# Patient Record
Sex: Male | Born: 1965 | Race: White | Hispanic: No | Marital: Married | Smoking: Never smoker
Health system: Southern US, Community
[De-identification: ages and names within clinical notes are randomized; demographics above are authoritative.]

## PROBLEM LIST (undated history)

## (undated) DIAGNOSIS — G2581 Restless legs syndrome: Secondary | ICD-10-CM

## (undated) DIAGNOSIS — F329 Major depressive disorder, single episode, unspecified: Secondary | ICD-10-CM

## (undated) DIAGNOSIS — F419 Anxiety disorder, unspecified: Secondary | ICD-10-CM

## (undated) DIAGNOSIS — G4733 Obstructive sleep apnea (adult) (pediatric): Secondary | ICD-10-CM

## (undated) DIAGNOSIS — G4730 Sleep apnea, unspecified: Secondary | ICD-10-CM

## (undated) DIAGNOSIS — E119 Type 2 diabetes mellitus without complications: Secondary | ICD-10-CM

## (undated) DIAGNOSIS — I10 Essential (primary) hypertension: Secondary | ICD-10-CM

## (undated) DIAGNOSIS — E0590 Thyrotoxicosis, unspecified without thyrotoxic crisis or storm: Secondary | ICD-10-CM

## (undated) DIAGNOSIS — E785 Hyperlipidemia, unspecified: Secondary | ICD-10-CM

## (undated) DIAGNOSIS — Z9989 Dependence on other enabling machines and devices: Secondary | ICD-10-CM

## (undated) DIAGNOSIS — F32A Depression, unspecified: Secondary | ICD-10-CM

## (undated) HISTORY — DX: Essential (primary) hypertension: I10

## (undated) HISTORY — PX: HERNIA REPAIR: SHX51

## (undated) HISTORY — DX: Obstructive sleep apnea (adult) (pediatric): G47.33

## (undated) HISTORY — DX: Dependence on other enabling machines and devices: Z99.89

## (undated) HISTORY — DX: Hyperlipidemia, unspecified: E78.5

## (undated) HISTORY — DX: Sleep apnea, unspecified: G47.30

## (undated) HISTORY — DX: Type 2 diabetes mellitus without complications: E11.9

## (undated) HISTORY — DX: Restless legs syndrome: G25.81

## (undated) HISTORY — DX: Thyrotoxicosis, unspecified without thyrotoxic crisis or storm: E05.90

## (undated) HISTORY — DX: Major depressive disorder, single episode, unspecified: F32.9

## (undated) HISTORY — DX: Depression, unspecified: F32.A

## (undated) HISTORY — DX: Anxiety disorder, unspecified: F41.9

---

## 1990-06-03 HISTORY — PX: WISDOM TOOTH EXTRACTION: SHX21

## 2001-06-18 ENCOUNTER — Encounter: Payer: Self-pay | Admitting: Emergency Medicine

## 2001-06-18 ENCOUNTER — Emergency Department (HOSPITAL_COMMUNITY): Admission: EM | Admit: 2001-06-18 | Discharge: 2001-06-18 | Payer: Self-pay | Admitting: Emergency Medicine

## 2003-08-29 ENCOUNTER — Encounter: Admission: RE | Admit: 2003-08-29 | Discharge: 2003-11-27 | Payer: Self-pay | Admitting: Family Medicine

## 2003-12-08 ENCOUNTER — Encounter: Admission: RE | Admit: 2003-12-08 | Discharge: 2003-12-08 | Payer: Self-pay | Admitting: Family Medicine

## 2005-06-03 HISTORY — PX: VASECTOMY: SHX75

## 2006-05-06 ENCOUNTER — Ambulatory Visit: Payer: Self-pay | Admitting: Pulmonary Disease

## 2006-09-23 ENCOUNTER — Encounter: Admission: RE | Admit: 2006-09-23 | Discharge: 2006-09-23 | Payer: Self-pay | Admitting: Family Medicine

## 2007-08-10 ENCOUNTER — Ambulatory Visit (HOSPITAL_COMMUNITY): Admission: RE | Admit: 2007-08-10 | Discharge: 2007-08-10 | Payer: Self-pay | Admitting: Cardiology

## 2009-02-13 ENCOUNTER — Ambulatory Visit (HOSPITAL_COMMUNITY): Admission: RE | Admit: 2009-02-13 | Discharge: 2009-02-13 | Payer: Self-pay | Admitting: Family Medicine

## 2009-06-26 ENCOUNTER — Encounter: Admission: RE | Admit: 2009-06-26 | Discharge: 2009-06-26 | Payer: Self-pay | Admitting: Family Medicine

## 2009-09-06 ENCOUNTER — Encounter: Admission: RE | Admit: 2009-09-06 | Discharge: 2009-10-05 | Payer: Self-pay | Admitting: Endocrinology

## 2009-09-07 ENCOUNTER — Ambulatory Visit: Payer: Self-pay | Admitting: Internal Medicine

## 2009-09-07 DIAGNOSIS — E1165 Type 2 diabetes mellitus with hyperglycemia: Secondary | ICD-10-CM

## 2009-09-07 DIAGNOSIS — Z6841 Body Mass Index (BMI) 40.0 and over, adult: Secondary | ICD-10-CM

## 2009-09-07 DIAGNOSIS — I10 Essential (primary) hypertension: Secondary | ICD-10-CM

## 2009-09-07 DIAGNOSIS — Z87442 Personal history of urinary calculi: Secondary | ICD-10-CM | POA: Insufficient documentation

## 2009-09-07 DIAGNOSIS — J45909 Unspecified asthma, uncomplicated: Secondary | ICD-10-CM

## 2009-09-07 DIAGNOSIS — E0590 Thyrotoxicosis, unspecified without thyrotoxic crisis or storm: Secondary | ICD-10-CM | POA: Insufficient documentation

## 2009-09-07 DIAGNOSIS — E1169 Type 2 diabetes mellitus with other specified complication: Secondary | ICD-10-CM | POA: Insufficient documentation

## 2009-09-07 DIAGNOSIS — F329 Major depressive disorder, single episode, unspecified: Secondary | ICD-10-CM

## 2009-09-07 DIAGNOSIS — E1140 Type 2 diabetes mellitus with diabetic neuropathy, unspecified: Secondary | ICD-10-CM

## 2009-09-07 DIAGNOSIS — E669 Obesity, unspecified: Secondary | ICD-10-CM | POA: Insufficient documentation

## 2009-09-07 DIAGNOSIS — Z9189 Other specified personal risk factors, not elsewhere classified: Secondary | ICD-10-CM | POA: Insufficient documentation

## 2009-11-09 ENCOUNTER — Ambulatory Visit: Payer: Self-pay | Admitting: Internal Medicine

## 2009-11-09 DIAGNOSIS — F909 Attention-deficit hyperactivity disorder, unspecified type: Secondary | ICD-10-CM | POA: Insufficient documentation

## 2009-11-09 LAB — CONVERTED CEMR LAB
Alkaline Phosphatase: 51 units/L (ref 39–117)
Bilirubin, Direct: 0.1 mg/dL (ref 0.0–0.3)
Calcium: 9.6 mg/dL (ref 8.4–10.5)
Chloride: 103 meq/L (ref 96–112)
Creatinine, Ser: 0.7 mg/dL (ref 0.4–1.5)
Sodium: 142 meq/L (ref 135–145)
Total Bilirubin: 0.6 mg/dL (ref 0.3–1.2)

## 2009-12-11 ENCOUNTER — Telehealth: Payer: Self-pay | Admitting: Internal Medicine

## 2010-01-08 ENCOUNTER — Ambulatory Visit: Payer: Self-pay | Admitting: Internal Medicine

## 2010-01-11 ENCOUNTER — Telehealth: Payer: Self-pay | Admitting: Internal Medicine

## 2010-02-21 ENCOUNTER — Ambulatory Visit: Payer: Self-pay | Admitting: Internal Medicine

## 2010-02-21 DIAGNOSIS — G4733 Obstructive sleep apnea (adult) (pediatric): Secondary | ICD-10-CM

## 2010-02-21 DIAGNOSIS — R3915 Urgency of urination: Secondary | ICD-10-CM

## 2010-02-22 LAB — CONVERTED CEMR LAB
Nitrite: NEGATIVE
Urine Glucose: 500 mg/dL
pH: 6 (ref 5.0–8.0)

## 2010-03-19 ENCOUNTER — Telehealth: Payer: Self-pay | Admitting: Internal Medicine

## 2010-03-26 ENCOUNTER — Telehealth: Payer: Self-pay | Admitting: Internal Medicine

## 2010-04-09 ENCOUNTER — Telehealth: Payer: Self-pay | Admitting: Internal Medicine

## 2010-05-21 ENCOUNTER — Telehealth: Payer: Self-pay | Admitting: Internal Medicine

## 2010-06-12 ENCOUNTER — Ambulatory Visit
Admission: RE | Admit: 2010-06-12 | Discharge: 2010-06-12 | Payer: Self-pay | Source: Home / Self Care | Attending: Internal Medicine | Admitting: Internal Medicine

## 2010-06-12 DIAGNOSIS — K120 Recurrent oral aphthae: Secondary | ICD-10-CM | POA: Insufficient documentation

## 2010-06-12 DIAGNOSIS — I889 Nonspecific lymphadenitis, unspecified: Secondary | ICD-10-CM | POA: Insufficient documentation

## 2010-07-03 NOTE — Assessment & Plan Note (Signed)
Summary: FU Natale Milch  #   Vital Signs:  Patient profile:   45 year old male Height:      72 inches Weight:      275 pounds BMI:     37.43 O2 Sat:      97 % on Room air Temp:     98.2 degrees F oral Pulse rate:   83 / minute Pulse rhythm:   regular Resp:     16 per minute BP sitting:   122 / 88  (left arm) Cuff size:   large  Vitals Entered By: Lanier Prude, CMA(AAMA) (January 08, 2010 8:24 AM)  O2 Flow:  Room air CC: f/u Is Patient Diabetic? Yes   CC:  f/u.  History of Present Illness: The patient presents for a follow up of back pain, anxiety, depression. Depression is worse. Marital stress is worse.   Current Medications (verified): 1)  Vitamin D 1000 Unit Tabs (Cholecalciferol) .Marland Kitchen.. 1 By Mouth Qd 2)  Wellbutrin Sr 150 Mg Xr12h-Tab (Bupropion Hcl) .Marland Kitchen.. 1 By Mouth Q Am and Q Lunch (Two Times A Day) 3)  Aspirin 81 Mg Tabs (Aspirin) .... Once Daily 4)  Lyrica 50 Mg Caps (Pregabalin) .... Take 1 By Mouth Qd 5)  Metformin Hcl 1000 Mg Tabs (Metformin Hcl) .... Take 2 At Bedtime 6)  Amlodipine Besylate 10 Mg Tabs (Amlodipine Besylate) .... Once Daily 7)  Glimepiride 10 Mg Tabs (Glimepiride) .... Take 1 Po Qd 8)  Ramipril 10 Mg Caps (Ramipril) .... Once Daily 9)  Lantus Solostar 100 Unit/ml Soln (Insulin Glargine) .... 50 Units At Bedtime 10)  Byetta 10 Mcg Pen 10 Mcg/0.41ml Soln (Exenatide) .... Take 2 Injections Once Daily 11)  Lipitor 40 Mg Tabs (Atorvastatin Calcium) .Marland Kitchen.. 1 By Mouth Once Daily 12)  Vyvanse 30 Mg Caps (Lisdexamfetamine Dimesylate) .Marland Kitchen.. 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  The patient denies fever, chest pain, dyspnea on exertion, and abdominal pain.         Lost a little wt  Physical Exam  General:  overweight-appearing.   Head:  Normocephalic and atraumatic without obvious abnormalities. No apparent alopecia or balding. Nose:  External nasal examination shows no deformity or inflammation. Nasal mucosa are pink and moist without lesions or exudates. Mouth:  Oral mucosa and oropharynx without lesions or exudates.  Teeth in good repair. Neck:  No deformities, masses, or tenderness noted. Lungs:  Normal respiratory effort, chest expands symmetrically. Lungs are clear to auscultation, no crackles or wheezes. Heart:  Normal rate and regular rhythm. S1 and S2 normal without gallop, murmur, click, rub or other extra sounds. Abdomen:  Bowel sounds positive,abdomen soft and non-tender without masses, organomegaly or hernias noted. Large abdomen Msk:  No deformity or scoliosis noted of thoracic or lumbar spine.   Extremities:  No clubbing, cyanosis, edema, or deformity noted with normal full range of motion of all  joints.   Neurologic:  No cranial nerve deficits noted. Station and gait are normal. Plantar reflexes are down-going bilaterally. DTRs are symmetrical throughout. Sensory, motor and coordinative functions appear intact. Skin:  Intact without suspicious lesions or rashes Psych:  Oriented X3, good eye contact, not suicidal, not homicidal, and depressed affect.     Impression & Recommendations:  Problem # 1:  DEPRESSION (ICD-311) Assessment Deteriorated  He has been seeing a councellor. Discussed his situation.  His updated medication list for this problem includes:     Wellbutrin Sr 150 Mg Xr12h-tab (Bupropion hcl) .Marland Kitchen... 1 by mouth q am and q lunch (two times a day)    Citalopram Hydrobromide 10 Mg Tabs (Citalopram hydrobromide) .Marland Kitchen... 1 by mouth once daily for depression - will add.  Orders: Psychiatric Referral (Psych)  Problem # 2:  ADHD (ICD-314.01) Assessment: Unchanged  Orders: Psychiatric Referral (Psych)  Problem # 3:  HYPERTHYROIDISM (ICD-242.90) Assessment: Unchanged  Problem # 4:  DIABETES MELLITUS, TYPE II (ICD-250.00) Assessment: Comment Only  His updated medication list for this problem includes:    Aspirin 81 Mg Tabs (Aspirin) ..... Once daily    Metformin Hcl 1000 Mg Tabs (Metformin hcl) .Marland Kitchen... Take 2 at bedtime    Ramipril 10 Mg Caps (Ramipril) ..... Once daily    Lantus Solostar 100 Unit/ml Soln (Insulin glargine) .Marland KitchenMarland KitchenMarland KitchenMarland Kitchen 50 units at bedtime    Byetta 10 Mcg Pen 10 Mcg/0.73ml Soln (Exenatide) .Marland Kitchen... Take 2 injections once daily  Problem # 5:  ASTHMA (ICD-493.90) Assessment: Unchanged  Problem # 6:  HYPERTENSION (ICD-401.9) Assessment: Improved  His updated medication list for this problem includes:    Amlodipine Besylate 10 Mg Tabs (Amlodipine besylate) ..... Once daily    Ramipril 10 Mg Caps (Ramipril) ..... Once daily  BP today: 122/88 Prior BP: 134/76 (11/09/2009)  Labs Reviewed: K+: 4.6 (11/09/2009) Creat: : 0.7 (11/09/2009)     Complete Medication List: 1)  Vitamin D 1000 Unit Tabs (Cholecalciferol) .Marland Kitchen.. 1 by mouth qd 2)  Wellbutrin Sr 150 Mg Xr12h-tab (Bupropion hcl) .Marland Kitchen.. 1 by mouth q am and q lunch (two times a day) 3)  Aspirin 81 Mg Tabs (Aspirin) .... Once daily 4)  Lyrica 50 Mg Caps (Pregabalin) .... Take 1 by mouth qd 5)  Metformin Hcl 1000 Mg Tabs (Metformin hcl) .... Take 2 at bedtime 6)  Amlodipine Besylate 10 Mg Tabs (Amlodipine besylate) .... Once daily 7)  Glimepiride 10 Mg Tabs (glimepiride)  .... Take 1 po qd 8)  Ramipril 10 Mg Caps (Ramipril) .... Once daily 9)  Lantus Solostar 100 Unit/ml Soln  (Insulin glargine) .... 50 units at bedtime 10)  Byetta 10 Mcg Pen 10 Mcg/0.68ml Soln (Exenatide) .... Take 2 injections once daily 11)  Lipitor 40 Mg Tabs (Atorvastatin calcium) .Marland Kitchen.. 1 by mouth once daily 12)  Vyvanse 30 Mg Caps (Lisdexamfetamine dimesylate) .Marland Kitchen.. 1 by mouth qam - to fill December 11, 2009 13)  Citalopram Hydrobromide 10 Mg Tabs (Citalopram hydrobromide) .Marland Kitchen.. 1 by mouth once daily for depression  Patient Instructions: 1)  Please schedule a follow-up appointment in 6 weeks. Prescriptions: CITALOPRAM HYDROBROMIDE 10 MG TABS (CITALOPRAM HYDROBROMIDE) 1 by mouth once daily for depression  #30 x 6   Entered and Authorized by:   Tresa Garter MD   Signed by:   Tresa Garter MD on 01/08/2010   Method used:   Electronically to        [facility]* (retail)       [address]       [address] Shipping/mailing       [place], [place]  [address]       Ph: [phone]       Fax: [phone]   RxID:   [id number] CITALOPRAM HYDROBROMIDE 10 MG TABS (CITALOPRAM HYDROBROMIDE) 1 by mouth once daily for depression  #30 x 6   Entered and Authorized by:   Tresa Garter MD   Signed by:   Tresa Garter MD on 01/08/2010   Method used:   Print then Give to Patient   RxID:   [id number]

## 2010-07-03 NOTE — Progress Notes (Signed)
Summary: Vyvanse  Phone Note Call from Patient Call back at Home Phone [phone]   Summary of Call: Patient would like to know if vyvanse could be increased. Wife (who is a Engineer, civil (consulting)) suggested patient needs increase due to trouble focusing.   Lorain Childes - this med is 14$ at [facility] for supply) Initial call taken by: Lamar Sprinkles, CMA,  January 11, 2010 3:15 PM  Follow-up for Phone Call        OK to increase to 50 mg Follow-up by: Tresa Garter MD,  January 11, 2010 4:36 PM  Additional Follow-up for Phone Call Additional follow up Details #1::        Pt informed, provided rx for 90 days supply for cost savings.  Additional Follow-up by: Lamar Sprinkles, CMA,  January 15, 2010 12:26 PM    Additional Follow-up for Phone Call Additional follow up Details #2::    Agree. Thanks! Follow-up by: Tresa Garter MD,  January 15, 2010 5:55 PM  New/Updated Medications: VYVANSE 50 MG CAPS (LISDEXAMFETAMINE DIMESYLATE) 1 po qam VYVANSE 50 MG CAPS (LISDEXAMFETAMINE DIMESYLATE) 1 po  every morning Prescriptions: VYVANSE 50 MG CAPS (LISDEXAMFETAMINE DIMESYLATE) 1 po  every morning  #90 x 0   Entered by:   Lamar Sprinkles, CMA   Authorized by:   Tresa Garter MD   Signed by:   Lamar Sprinkles, CMA on 01/15/2010   Method used:   Print then Give to Patient   RxID:   [id number] VYVANSE 50 MG CAPS (LISDEXAMFETAMINE DIMESYLATE) 1 po qam  #30 x 0   Entered and Authorized by:   Tresa Garter MD   Signed by:   Tresa Garter MD on 01/11/2010   Method used:   Print then Give to Patient   RxID:   [id number]

## 2010-07-03 NOTE — Progress Notes (Signed)
Summary: Lipitor  Phone Note Call from Patient   Caller: Patient Summary of Call: pt called stating he is out of lipitor and cant use Lipitor savings card until 03-26-10.  I informed him I put lipitor 20mg  tabs #14 upfront to take until he can p/u Rf.  I advised him to take 2 once daily. Initial call taken by: Lanier Prude, [facility]),  March 19, 2010 2:59 PM    Prescriptions: LIPITOR 40 MG TABS (ATORVASTATIN CALCIUM) 1 by mouth once daily  #14 x 0   Entered by:   Lanier Prude, [facility])   Authorized by:   Tresa Garter MD   Signed by:   Lanier Prude, CMA(AAMA) on 03/19/2010   Method used:   Samples Given   RxID:   [id number]

## 2010-07-03 NOTE — Assessment & Plan Note (Signed)
Summary: 6 WK ROV /NWS  #   Vital Signs:  Patient profile:   45 year old male Height:      72 inches Weight:      267 pounds BMI:     36.34 Temp:     98.9 degrees F oral Pulse rate:   80 / minute Pulse rhythm:   regular Resp:     16 per minute BP sitting:   130 / 90  (left arm) Cuff size:   large  Vitals Entered By: Lanier Prude, CMA(AAMA) (February 21, 2010 1:45 PM) CC: 6 wk f/u  c/o painful urination X 3 days Is Patient Diabetic? Yes   CC:  6 wk f/u  c/o painful urination X 3 days.  History of Present Illness: The patient presents for a follow up of hypertension, diabetes, depression, obesity. C/o urinary freqquency. No LBP.  Current Medications (verified): 1)  Vitamin D 1000 Unit Tabs (Cholecalciferol) .Marland Kitchen.. 1 By Mouth Qd 2)  Wellbutrin Sr 150 Mg Xr12h-Tab (Bupropion Hcl) .Marland Kitchen.. 1 By Mouth Q Am and Q Lunch (Two Times A Day) 3)  Aspirin 81 Mg Tabs (Aspirin) .... Once Daily 4)  Lyrica 50 Mg Caps (Pregabalin) .... Take 1 By Mouth Qd 5)  Metformin Hcl 1000 Mg Tabs (Metformin Hcl) .... Take 2 At Bedtime 6)  Amlodipine Besylate 10 Mg Tabs (Amlodipine Besylate) .... Once Daily 7)  Glimepiride 10 Mg Tabs (Glimepiride) .... Take 1 Po Qd 8)  Ramipril 10 Mg Caps (Ramipril) .... Once Daily 9)  Lantus Solostar 100 Unit/ml Soln (Insulin Glargine) .... 50 Units At Bedtime 10)  Byetta 10 Mcg Pen 10 Mcg/0.72ml Soln (Exenatide) .... Take 2 Injections Once Daily 11)  Lipitor 40 Mg Tabs (Atorvastatin Calcium) .Marland Kitchen.. 1 By Mouth Once Daily 12)  Citalopram Hydrobromide 10 Mg Tabs (Citalopram Hydrobromide) .Marland Kitchen.. 1 By Mouth Once Daily For Depression 13)  Vyvanse 50 Mg Caps (Lisdexamfetamine Dimesylate) .Marland Kitchen.. 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    Physical Exam  General:  overweight-appearing.   Ears:  External ear exam shows no significant lesions or deformities.  Otoscopic examination reveals clear canals, tympanic membranes are intact bilaterally without bulging, retraction, inflammation or discharge. Hearing is grossly normal bilaterally. Nose:  External nasal examination shows no deformity or inflammation. Nasal mucosa are pink and moist without lesions or exudates. Mouth:  Oral mucosa and oropharynx without lesions or exudates.  Teeth in good repair. Neck:  No deformities, masses, or tenderness noted. Lungs:  Normal respiratory effort, chest expands symmetrically. Lungs are clear to auscultation, no crackles or wheezes. Heart:  Normal rate and regular rhythm. S1 and S2 normal without gallop, murmur, click, rub or other extra sounds. Abdomen:  Bowel sounds positive,abdomen soft and non-tender without masses, organomegaly or hernias noted. Large abdomen Msk:  No deformity or scoliosis noted of thoracic or lumbar spine.   Neurologic:  No cranial nerve deficits noted. Station and gait are normal. Plantar reflexes are down-going bilaterally. DTRs are symmetrical throughout. Sensory, motor and coordinative functions appear intact. Skin:  Intact without suspicious lesions or rashes Psych:  Oriented X3, good eye contact, not suicidal, not homicidal,  and less depressed affect.     Impression & Recommendations:  Problem # 1:  HYPERTENSION (ICD-401.9) Assessment Improved  His updated medication list for this problem includes:    Amlodipine Besylate 10 Mg Tabs (Amlodipine besylate) ..... Once daily    Ramipril 10 Mg Caps (Ramipril) ..... Once daily  Problem # 2:  DIABETES MELLITUS, TYPE II (ICD-250.00) Assessment: Comment Only  His updated medication list for this  problem includes:    Aspirin 81 Mg Tabs (Aspirin) ..... Once daily    Metformin Hcl 1000 Mg Tabs (Metformin hcl) .Marland Kitchen... Take 2 at bedtime    Ramipril 10 Mg Caps (Ramipril) ..... Once daily    Lantus Solostar 100 Unit/ml Soln (Insulin glargine) .Marland KitchenMarland KitchenMarland KitchenMarland Kitchen 50 units at bedtime    Byetta 10 Mcg Pen 10 Mcg/0.8ml Soln (Exenatide) .Marland Kitchen... Take 2 injections once daily  Problem # 3:  URINARY URGENCY (AVW-098.11) Assessment: New  Orders: TLB-Udip ONLY (81003-UDIP)  Problem # 4:  DEPRESSION (ICD-311) Assessment: Improved  His updated medication list for this problem includes:    Wellbutrin Sr 150 Mg Xr12h-tab (Bupropion hcl) .Marland Kitchen... 1 by mouth q am and q lunch (two times a day)    Citalopram Hydrobromide 10 Mg Tabs (Citalopram hydrobromide) .Marland Kitchen... 1 by mouth once daily for depression  Problem # 5:  OBSTRUCTIVE SLEEP APNEA (ICD-327.23) Assessment: Unchanged on cpap  Complete Medication List: 1)  Vitamin D 1000 Unit Tabs (Cholecalciferol) .Marland Kitchen.. 1 by mouth qd 2)  Wellbutrin Sr 150 Mg Xr12h-tab (Bupropion hcl) .Marland Kitchen.. 1 by mouth q am and q lunch (two times a day) 3)  Aspirin 81 Mg Tabs (Aspirin) .... Once daily 4)  Lyrica 50 Mg Caps (Pregabalin) .... Take 1 by mouth qd 5)  Metformin Hcl 1000 Mg Tabs (Metformin hcl) .... Take 2 at bedtime 6)  Amlodipine Besylate 10 Mg Tabs (Amlodipine besylate) .... Once daily 7)  Ramipril 10 Mg Caps (Ramipril) .... Once daily 8)  Lantus Solostar 100 Unit/ml Soln (Insulin glargine) .... 50 units at bedtime 9)  Byetta 10 Mcg Pen 10 Mcg/0.32ml Soln (Exenatide) .... Take 2 injections once daily 10)  Lipitor 40 Mg Tabs (Atorvastatin calcium) .Marland Kitchen.. 1 by mouth once daily 11)  Citalopram Hydrobromide 10 Mg Tabs (Citalopram hydrobromide) .Marland Kitchen.. 1 by mouth once daily for depression 12)  Vyvanse 50 Mg Caps (Lisdexamfetamine dimesylate) .Marland Kitchen.. 1 po  every morning 13)  Tramadol Hcl 50 Mg Tabs (Tramadol hcl) .Marland Kitchen.. 1-2 tabs by mouth two times a day as needed pain 14)  Glimepiride 10 Mg Tabs  (glimepiride)  .... Take 1 po qd  Other Orders: Admin 1st Vaccine (91478) Flu Vaccine 8yrs + (29562)  Patient Instructions: 1)  Please schedule a follow-up appointment in 3 months. Prescriptions: TRAMADOL HCL 50 MG TABS (TRAMADOL HCL) 1-2 tabs by mouth two times a day as needed pain  #100 x 3   Entered and Authorized by:   Tresa Garter MD   Signed by:   Tresa Garter MD on 02/21/2010   Method used:   Electronically to        [facility]* (retail)       [address]       [address] Shipping/mailing       [place], [place]  [address]       Ph: [phone]       Fax: [phone]   RxID:   [id number]  .lbflu   Flu Vaccine Consent Questions     Do you have  a history of severe allergic reactions to this vaccine? no    Any prior history of allergic reactions to egg and/or gelatin? no    Do you have a sensitivity to the preservative Thimersol? no    Do you have a past history of Guillan-Barre Syndrome? no    Do you currently have an acute febrile illness? no    Have you ever had a severe reaction to latex? no    Vaccine information given and explained to patient? yes    Are you currently pregnant? no    Lot Number:[id number]   Exp Date:12/01/2010   Site Given  Left Deltoid IM Lanier Prude, [facility])  February 21, 2010 2:11 PM

## 2010-07-03 NOTE — Assessment & Plan Note (Signed)
Summary: 2 MO ROV /NWS   Vital Signs:  Patient profile:   45 year old male Height:      72 inches Weight:      280 pounds BMI:     38.11 O2 Sat:      97 % on Room air Temp:     98.2 degrees F oral Pulse rate:   84 / minute BP sitting:   134 / 76  (left arm) Cuff size:   large  Vitals Entered By: Lucious Groves (November 09, 2009 10:16 AM)  O2 Flow:  Room air CC: 2 mo rtn ov./kb Is Patient Diabetic? Yes Pain Assessment Patient in pain? no      Comments Patient notes that he is not taking Venlafaxine./kb   CC:  2 mo rtn ov./kb.  History of Present Illness: C/o ADD per his councellor who wants him to try Vyvance. He used to be on Ritalin as a child. F/u obesity, DM, HTN...  Current Medications (verified): 1)  Vitamin D 1000 Unit Tabs (Cholecalciferol) .Marland Kitchen.. 1 By Mouth Qd 2)  Wellbutrin Sr 150 Mg Xr12h-Tab (Bupropion Hcl) .Marland Kitchen.. 1 By Mouth Q Am and Q Lunch (Two Times A Day) 3)  Aspirin 81 Mg Tabs (Aspirin) .... Once Daily 4)  Venlafaxine Hcl 150 Mg Xr24h-Tab (Venlafaxine Hcl) .... Take 1 By Mouth Qd 5)  Simvastatin 80 Mg Tabs (Simvastatin) .... Take 1 By Mouth Once Daily 6)  Lyrica 50 Mg Caps (Pregabalin) .... Take 1 By Mouth Qd 7)  Metformin Hcl 1000 Mg Tabs (Metformin Hcl) .... Take 2 At Bedtime 8)  Amlodipine Besylate 10 Mg Tabs (Amlodipine Besylate) .... Once Daily 9)  Glimepiride 10 Mg Tabs (Glimepiride) .... Take 1 Po Qd 10)  Ramipril 10 Mg Caps (Ramipril) .... Once Daily 11)  Lantus Solostar 100 Unit/ml Soln (Insulin Glargine) .... 50 Units At Bedtime 12)  Byetta 10 Mcg Pen 10 Mcg/0.63ml Soln (Exenatide) .... 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  The patient denies fever, weight gain, dyspnea on exertion, prolonged cough, and difficulty walking.    Physical Exam  General:  overweight-appearing.   Eyes:  No corneal or conjunctival inflammation noted. EOMI. Perrla.  Ears:  External ear exam shows no significant lesions or deformities.  Otoscopic examination reveals clear canals, tympanic membranes are intact bilaterally without bulging, retraction, inflammation or discharge. Hearing is grossly normal bilaterally. Nose:  External nasal examination shows no deformity or inflammation. Nasal mucosa are pink and moist without lesions or exudates. Mouth:  Oral mucosa and oropharynx without lesions or exudates.  Teeth in good repair. Lungs:  Normal respiratory effort, chest expands symmetrically. Lungs are clear to auscultation, no crackles or wheezes. Heart:  Normal rate and regular rhythm. S1 and S2 normal without gallop, murmur, click, rub or other extra sounds. Abdomen:  Bowel sounds positive,abdomen soft and non-tender without masses, organomegaly or hernias noted. Large abdomen Msk:  No deformity or scoliosis noted of thoracic or lumbar spine.   Extremities:  No clubbing, cyanosis, edema, or deformity noted with normal full range of  motion of all joints.   Neurologic:  No cranial nerve deficits noted. Station and gait are normal. Plantar reflexes are down-going bilaterally. DTRs are symmetrical throughout. Sensory, motor and coordinative functions appear intact. Skin:  Intact without suspicious lesions or rashes Psych:  Oriented X3, good eye contact, not suicidal, not homicidal, and depressed affect.     Impression & Recommendations:  Problem # 1:  HYPERTENSION (ICD-401.9) Assessment Improved  His updated medication list for this problem includes:    Amlodipine Besylate  10 Mg Tabs (Amlodipine besylate) ..... Once daily    Ramipril 10 Mg Caps (Ramipril) ..... Once daily  Orders: TLB-BMP (Basic Metabolic Panel-BMET) (80048-METABOL) TLB-Hepatic/Liver Function Pnl (80076-HEPATIC) TLB-TSH (Thyroid Stimulating Hormone) (84443-TSH)  Problem # 2:  OBESITY (ICD-278.00) Assessment: Improved Cont w/good diet  Problem # 3:  ASTHMA (ICD-493.90) Assessment: Unchanged Rx prn  Problem # 4:  ADHD (ICD-314.01) Assessment: Comment Only Options discussed. We can try Vyvance. Risks vs benefits and controversies of a long term Vyvance use were discussed.  Orders: TLB-BMP (Basic Metabolic Panel-BMET) (80048-METABOL) TLB-Hepatic/Liver Function Pnl (80076-HEPATIC) TLB-TSH (Thyroid Stimulating Hormone) (84443-TSH)  Problem # 5:  DIABETES MELLITUS, TYPE II (ICD-250.00) Assessment: Unchanged  His updated medication list for this problem includes:    Aspirin 81 Mg Tabs (Aspirin) ..... Once daily    Metformin Hcl 1000 Mg Tabs (Metformin hcl) .Marland Kitchen... Take 2 at bedtime    Ramipril 10 Mg Caps (Ramipril) ..... Once daily    Lantus Solostar 100 Unit/ml Soln (Insulin glargine) .Marland KitchenMarland KitchenMarland KitchenMarland Kitchen 50 units at bedtime    Byetta 10 Mcg Pen 10 Mcg/0.87ml Soln (Exenatide) .Marland Kitchen... Take 2 injections once daily  Problem # 6:  DEPRESSION (ICD-311) Assessment: Unchanged  The following medications were removed from the medication list:    Venlafaxine Hcl 150 Mg Xr24h-tab (Venlafaxine hcl) .Marland Kitchen... Take 1 by mouth qd His updated medication list for this problem includes:    Wellbutrin Sr 150 Mg Xr12h-tab (Bupropion hcl) .Marland Kitchen... 1 by mouth q am and q lunch (two times a day)  Orders: TLB-BMP (Basic Metabolic Panel-BMET) (80048-METABOL) TLB-Hepatic/Liver Function Pnl (80076-HEPATIC) TLB-TSH (Thyroid Stimulating Hormone) (84443-TSH)  Complete Medication List: 1)  Vitamin D 1000 Unit Tabs (Cholecalciferol) .Marland Kitchen.. 1 by mouth qd 2)  Wellbutrin Sr 150 Mg Xr12h-tab (Bupropion hcl) .Marland Kitchen.. 1 by mouth q am and q lunch  (two times a day) 3)  Aspirin 81 Mg Tabs (Aspirin) .... Once daily 4)  Lyrica 50 Mg Caps (Pregabalin) .... Take 1 by mouth qd 5)  Metformin Hcl 1000 Mg Tabs (Metformin hcl) .... Take 2 at bedtime 6)  Amlodipine Besylate 10 Mg Tabs (Amlodipine besylate) .... Once daily 7)  Glimepiride 10 Mg Tabs (glimepiride)  .... Take 1 po qd 8)  Ramipril 10 Mg Caps (Ramipril) .... Once daily 9)  Lantus Solostar 100 Unit/ml Soln (Insulin glargine) .... 50 units at bedtime 10)  Byetta 10 Mcg Pen 10 Mcg/0.58ml Soln (Exenatide) .... Take 2 injections once daily 11)  Lipitor 40 Mg Tabs (Atorvastatin calcium) .Marland Kitchen.. 1 by mouth once daily 12)  Vyvanse 30 Mg Caps (Lisdexamfetamine dimesylate) .Marland Kitchen.. 1 by mouth qam  Patient Instructions: 1)  Please schedule a follow-up appointment in 3 months. 2)  BMP prior to visit, ICD-9: 3)  Hepatic Panel prior to visit, ICD-9: 4)  Lipid Panel prior to visit, ICD-9: 5)  TSH prior to visit, ICD-9: 995.20  272.0 Prescriptions: VYVANSE 30 MG CAPS (LISDEXAMFETAMINE DIMESYLATE) 1 by mouth qam  #30 x 0   Entered and Authorized by:  Tresa Garter MD   Signed by:   Tresa Garter MD on 11/09/2009   Method used:   Print then Give to Patient   RxID:   [id number] LIPITOR 40 MG TABS (ATORVASTATIN CALCIUM) 1 by mouth once daily  #90 x 3   Entered and Authorized by:   Tresa Garter MD   Signed by:   Tresa Garter MD on 11/09/2009   Method used:   Print then Give to Patient   RxID:   [id number]

## 2010-07-03 NOTE — Progress Notes (Signed)
Summary: REFILL - Plot pt  Phone Note Refill Request Call back at Home Phone [phone]   Refills Requested: Medication #1:  VYVANSE 30 MG CAPS 1 by mouth qam. Initial call taken by: Lamar Sprinkles, CMA,  December 11, 2009 11:57 AM  Follow-up for Phone Call        done hardcopy to LIM side B - dahlia  Follow-up by: Corwin Levins MD,  December 11, 2009 1:26 PM  Additional Follow-up for Phone Call Additional follow up Details #1::        pt informed, Rx in cabinet for pt pick up Additional Follow-up by: Margaret Pyle, CMA,  December 11, 2009 2:01 PM    New/Updated Medications: VYVANSE 30 MG CAPS (LISDEXAMFETAMINE DIMESYLATE) 1 by mouth qam - to fill December 11, 2009 Prescriptions: VYVANSE 30 MG CAPS (LISDEXAMFETAMINE DIMESYLATE) 1 by mouth qam - to fill December 11, 2009  #30 x 0   Entered and Authorized by:   Corwin Levins MD   Signed by:   Corwin Levins MD on 12/11/2009   Method used:   Print then Give to Patient   RxID:   [id number]

## 2010-07-03 NOTE — Progress Notes (Signed)
Summary: REFILL   Phone Note Refill Request   Refills Requested: Medication #1:  LYRICA 50 MG CAPS take 1 by mouth qd TO GO TO [facility]  Initial call taken by: Lamar Sprinkles, CMA,  March 26, 2010 11:31 AM  Follow-up for Phone Call        ok 6 ref Follow-up by: Tresa Garter MD,  March 26, 2010 5:51 PM    Prescriptions: LYRICA 50 MG CAPS (PREGABALIN) take 1 by mouth qd  #90 x 1   Entered by:   Lamar Sprinkles, CMA   Authorized by:   Tresa Garter MD   Signed by:   Lamar Sprinkles, CMA on 03/26/2010   Method used:   Telephoned to ...       [facility] (retail)       [address]       [address] Shipping/mailing       [place], [place]  [address]       Ph: [phone]       Fax: [phone]   RxID:   [id number]

## 2010-07-03 NOTE — Assessment & Plan Note (Signed)
Summary: NEW PT OK'D PER FLAG/ UMR/ NWS   Vital Signs:  Patient profile:   45 year old male Height:      72 inches (182.88 cm) Weight:      287.4 pounds (130.64 kg) BMI:     39.12 O2 Sat:      97 % on Room air Temp:     98.9 degrees F (37.17 degrees C) oral Pulse rate:   96 / minute BP sitting:   138 / 62  Vitals Entered By: Orlan Leavens (September 07, 2009 9:34 AM)  O2 Flow:  Room air CC: New patient Is Patient Diabetic? No Pain Assessment Patient in pain? no        CC:  New patient.  History of Present Illness: The patient presents for a wellness examination  C/o wt gain 20 lbs, depression  Preventive Screening-Counseling & Management  Alcohol-Tobacco     Smoking Status: never  Current Medications (verified): 1)  Vitamin D 1000 Unit Tabs (Cholecalciferol) .Marland Kitchen.. 1 By Mouth Qd 2)  Wellbutrin Sr 150 Mg Xr12h-Tab (Bupropion Hcl) .Marland Kitchen.. 1 By Mouth Q Am and Q Lunch (Two Times A Day) 3)  Aspirin 81 Mg Tabs (Aspirin) .... Once Daily 4)  Venlafaxine Hcl 150 Mg Xr24h-Tab (Venlafaxine Hcl) .... Take 1 By Mouth Qd 5)  Simvastatin 80 Mg Tabs (Simvastatin) .... Take 1 By Mouth Once Daily 6)  Lyrica 50 Mg Caps (Pregabalin) .... Take 1 By Mouth Qd 7)  Metformin Hcl 1000 Mg Tabs (Metformin Hcl) .... Take 2 At Bedtime 8)  Amlodipine Besylate 10 Mg Tabs (Amlodipine Besylate) .... Once Daily 9)  Glimepiride 10 Mg Tabs (Glimepiride) .... Take 1 Po Qd 10)  Ramipril 10 Mg Caps (Ramipril) .... Once Daily 11)  Lantus Solostar 100 Unit/ml Soln (Insulin Glargine) .... 50 Units At Bedtime 12)  Byetta 10 Mcg Pen 10 Mcg/0.46ml Soln (Exenatide) .... 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  The patient denies anorexia, fever, weight loss, vision loss, decreased hearing, hoarseness, chest pain, syncope, peripheral edema, prolonged cough, headaches, hemoptysis, abdominal pain, melena, hematochezia, severe indigestion/heartburn, hematuria, incontinence, genital sores, muscle weakness, suspicious skin lesions, transient blindness, difficulty walking, unusual weight change, abnormal bleeding, enlarged lymph nodes, angioedema, and testicular masses.    Physical Exam  General:  overweight-appearing.   Head:  Normocephalic and atraumatic without obvious abnormalities. No apparent alopecia or balding. Eyes:  No corneal or conjunctival inflammation noted. EOMI. Perrla.  Ears:  External ear exam shows no significant lesions or deformities.  Otoscopic examination reveals clear canals, tympanic membranes are intact bilaterally without bulging, retraction, inflammation or discharge. Hearing is grossly normal bilaterally. Nose:  External nasal examination shows no deformity or inflammation. Nasal mucosa are pink and moist without lesions or exudates. Mouth:  Oral mucosa and oropharynx without lesions or exudates.  Teeth in good repair. Neck:  No deformities, masses, or tenderness noted. Lungs:  Normal respiratory effort, chest expands symmetrically.  Lungs are clear to auscultation, no crackles or wheezes. Heart:  Normal rate and regular rhythm. S1 and S2 normal without gallop, murmur, click, rub or other extra sounds. Abdomen:  Bowel sounds positive,abdomen soft and non-tender without masses, organomegaly or hernias noted. Large abdomen Msk:  No deformity or scoliosis noted of thoracic or lumbar  spine.   Pulses:  R and L carotid,radial,femoral,dorsalis pedis and posterior tibial pulses are full and equal bilaterally Extremities:  No clubbing, cyanosis, edema, or deformity noted with normal full range of motion of all joints.   Neurologic:  No cranial nerve deficits noted. Station and gait are normal. Plantar reflexes are down-going bilaterally. DTRs are symmetrical throughout. Sensory, motor and coordinative functions appear intact. Skin:  Intact without suspicious lesions or rashes Cervical Nodes:  No lymphadenopathy noted Inguinal Nodes:  No significant adenopathy Psych:  Oriented X3, good eye contact, not suicidal, not homicidal, and depressed affect.     Impression & Recommendations:  Problem # 1:  PHYSICAL EXAMINATION (ICD-V70.0) Assessment New Health and age related issues were discussed. Available screening tests and vaccinations were discussed as well. Healthy life style including good diet and execise was discussed.  Labs ordered  Problem # 2:  OBESITY (ICD-278.00) Assessment: Deteriorated See "Patient Instructions". Lap band option discussed  Problem # 3:  DEPRESSION (ICD-311) Assessment: Deteriorated  His updated medication list for this problem includes:    Wellbutrin Sr 150 Mg Xr12h-tab (Bupropion hcl) .Marland Kitchen... 1 by mouth q am and q lunch (two times a day)    Venlafaxine Hcl 150 Mg Xr24h-tab (Venlafaxine hcl) .Marland Kitchen... Take 1 by mouth qd  Problem # 4:  DIABETES MELLITUS, TYPE II (ICD-250.00) Assessment: Comment Only  His updated medication list for this problem includes:    Aspirin 81 Mg Tabs (Aspirin) ..... Once daily    Metformin Hcl 1000 Mg Tabs (Metformin hcl) .Marland Kitchen... Take 2 at bedtime    Ramipril 10 Mg Caps (Ramipril) ..... Once daily    Lantus Solostar 100 Unit/ml Soln (Insulin glargine) .Marland KitchenMarland KitchenMarland KitchenMarland Kitchen 50 units at bedtime    Byetta 10 Mcg Pen 10 Mcg/0.30ml Soln (Exenatide) .Marland Kitchen... Take 2 injections once daily  Complete Medication List: 1)  Vitamin D 1000 Unit Tabs  (Cholecalciferol) .Marland Kitchen.. 1 by mouth qd 2)  Wellbutrin Sr 150 Mg Xr12h-tab (Bupropion hcl) .Marland Kitchen.. 1 by mouth q am and q lunch (two times a day) 3)  Aspirin 81 Mg Tabs (Aspirin) .... Once daily 4)  Venlafaxine Hcl 150 Mg Xr24h-tab (Venlafaxine hcl) .... Take 1 by mouth qd 5)  Simvastatin 80 Mg Tabs (Simvastatin) .... Take 1 by mouth once daily 6)  Lyrica 50 Mg Caps (Pregabalin) .... Take 1 by mouth qd 7)  Metformin Hcl 1000 Mg Tabs (Metformin hcl) .... Take 2 at bedtime 8)  Amlodipine Besylate 10 Mg Tabs (Amlodipine besylate) .... Once daily 9)  Glimepiride 10 Mg Tabs (glimepiride)  .... Take 1 po qd 10)  Ramipril 10 Mg Caps (Ramipril) .... Once daily 11)  Lantus Solostar 100 Unit/ml Soln (Insulin glargine) .... 50 units at bedtime 12)  Byetta 10 Mcg Pen 10 Mcg/0.2ml Soln (Exenatide) .... Take 2 injections once daily  Patient Instructions: 1)  Please schedule a follow-up appointment in 2 months. 2)  [email or web address] Prescriptions: WELLBUTRIN SR 150 MG XR12H-TAB (BUPROPION HCL) 1 by mouth q am and q lunch (two times a day)  #60 x 6   Entered and Authorized by:   Tresa Garter MD   Signed by:   Georgina Quint Doralee Kocak  MD on 09/07/2009   Method used:   Print then Give to Patient   RxID:   [id number]

## 2010-07-03 NOTE — Progress Notes (Signed)
  Phone Note Refill Request Message from:  Fax from Pharmacy on April 09, 2010 9:29 AM  Refills Requested: Medication #1:  VYVANSE 50 MG CAPS 1 po  every morning Initial call taken by: Ami Bullins CMA,  April 09, 2010 9:32 AM  Follow-up for Phone Call        prescription printed out and sign by Dr Yetta Barre in absence of Dr Macario Golds, pt informed and prescription up front for pt to pick up Follow-up by: Ami Bullins CMA,  April 09, 2010 10:04 AM    Prescriptions: VYVANSE 50 MG CAPS (LISDEXAMFETAMINE DIMESYLATE) 1 po  every morning  #90 x 0   Entered by:   Ami Bullins CMA   Authorized by:   Tresa Garter MD   Signed by:   Bill Salinas CMA on 04/09/2010   Method used:   Print then Give to Patient   RxID:   [id number]

## 2010-07-05 NOTE — Progress Notes (Signed)
Summary: CPAP RX -Needs asap  Phone Note Call from Patient Call back at Home Phone [phone] Call back at [phone]   Summary of Call: Patient is requesting rx for new cpap machine. Pt dropped machine & it broke. Req this get to [facility] asap.  Initial call taken by: Lamar Sprinkles, CMA,  May 21, 2010 4:39 PM  Follow-up for Phone Call        ok Thank you!  Follow-up by: Tresa Garter MD,  May 21, 2010 5:22 PM  Additional Follow-up for Phone Call Additional follow up Details #1::        Pt informed, he need parts for machine b/c it fell off table & broke. Will call pt back after she speaks w/a different med supply comp.  Additional Follow-up by: Lamar Sprinkles, CMA,  May 21, 2010 5:40 PM    Additional Follow-up for Phone Call Additional follow up Details #2::    Pt will call med supply in the am and call office back w/details of what rx needs to say & fax #..................Marland KitchenLamar Sprinkles, CMA  May 21, 2010 6:09 PM   Order pending signature, will call pt when faxed.  Follow-up by: Lamar Sprinkles, CMA,  May 22, 2010 9:11 AM  Additional Follow-up for Phone Call Additional follow up Details #3:: Details for Additional Follow-up Action Taken: "c pap supplies" is all rx needs to say, fax # [phone] -- [id number]. Additional Follow-up by: Verdell Face,  May 22, 2010 12:08 PM  called pt and informed him that the order was signed and faxed to [facility]/Ami Bullins CMA  May 22, 2010 1:50 PM

## 2010-07-05 NOTE — Assessment & Plan Note (Signed)
Summary: TONGUE/BUMP---SWOLLEN LYMPH NODES---STC   Vital Signs:  Patient profile:   45 year old male Height:      72 inches Weight:      276 pounds BMI:     37.57 Temp:     98.9 degrees F oral Pulse rate:   88 / minute Pulse rhythm:   regular Resp:     16 per minute BP sitting:   150 / 88  (left arm) Cuff size:   large  Vitals Entered By: Lanier Prude, CMA(AAMA) (June 12, 2010 2:20 PM) CC: ulcer on tongue X 2-3 days, swollen/painful glands Is Patient Diabetic? Yes   CC:  ulcer on tongue X 2-3 days and swollen/painful glands.  History of Present Illness: C/o L tongue sore x 3 d and a swollen gland below  Current Medications (verified): 1)  Vitamin D 1000 Unit Tabs (Cholecalciferol) .Marland Kitchen.. 1 By Mouth Qd 2)  Wellbutrin Sr 150 Mg Xr12h-Tab (Bupropion Hcl) .Marland Kitchen.. 1 By Mouth Q Am and Q Lunch (Two Times A Day) 3)  Aspirin 81 Mg Tabs (Aspirin) .... Once Daily 4)  Lyrica 50 Mg Caps (Pregabalin) .... Take 1 By Mouth Qd 5)  Metformin Hcl 1000 Mg Tabs (Metformin Hcl) .... Take 2 At Bedtime 6)  Amlodipine Besylate 10 Mg Tabs (Amlodipine Besylate) .... Once Daily 7)  Ramipril 10 Mg Caps (Ramipril) .... Once Daily 8)  Lantus Solostar 100 Unit/ml Soln (Insulin Glargine) .... 50 Units At Bedtime 9)  Byetta 10 Mcg Pen 10 Mcg/0.20ml Soln (Exenatide) .... Take 2 Injections Once Daily 10)  Lipitor 40 Mg Tabs (Atorvastatin Calcium) .Marland Kitchen.. 1 By Mouth Once Daily 11)  Citalopram Hydrobromide 10 Mg Tabs (Citalopram Hydrobromide) .Marland Kitchen.. 1 By Mouth Once Daily For Depression 12)  Vyvanse 50 Mg Caps (Lisdexamfetamine Dimesylate) .Marland Kitchen.. 1 Po  Every Morning 13)  Tramadol Hcl 50 Mg Tabs (Tramadol Hcl) .Marland Kitchen.. 1-2 Tabs By Mouth Two Times A Day As Needed Pain 14)  Glimepiride 10 Mg Tabs (Glimepiride) .... 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   Mouth:  Oral mucosa and oropharynx  exudates.   4x17mm ulcer on L lat tongue is present. Teeth in good repair. Neck:  No deformities, masses, or tenderness noted. L LN is swollenL neck mass.   Lungs:  Normal respiratory effort, chest expands symmetrically. Lungs are clear to auscultation, no crackles or wheezes. Heart:  Normal rate and regular rhythm. S1 and S2 normal without gallop, murmur, click, rub or other extra sounds. Abdomen:  Bowel sounds positive,abdomen soft and non-tender without masses, organomegaly or hernias noted. Large abdomen Skin:  Intact without suspicious lesions or rashes   Impression & Recommendations:  Problem # 1:  APHTHOUS ULCERS (ICD-528.2) Assessment New See meds  Problem # 2:  LYMPHADENITIS (ICD-289.3) due to #1 Assessment: New  Complete Medication List: 1)  Vitamin D 1000 Unit Tabs (Cholecalciferol) .Marland Kitchen.. 1 by mouth qd 2)  Wellbutrin Sr 150 Mg Xr12h-tab (Bupropion hcl) .Marland Kitchen.. 1 by mouth q am and q lunch (two times a day) 3)  Aspirin 81 Mg Tabs (Aspirin) .... Once daily 4)  Lyrica 50 Mg Caps (Pregabalin) .... Take 1 by mouth qd 5)  Metformin Hcl 1000 Mg Tabs (  Metformin hcl) .... Take 2 at bedtime 6)  Amlodipine Besylate 10 Mg Tabs (Amlodipine besylate) .... Once daily 7)  Ramipril 10 Mg Caps (Ramipril) .... Once daily 8)  Lantus Solostar 100 Unit/ml Soln (Insulin glargine) .... 50 units at bedtime 9)  Byetta 10 Mcg Pen 10 Mcg/0.39ml Soln (Exenatide) .... Take 2 injections once daily 10)  Lipitor 40 Mg Tabs (Atorvastatin calcium) .Marland Kitchen.. 1 by mouth once daily 11)  Citalopram Hydrobromide 10 Mg Tabs (Citalopram hydrobromide) .Marland Kitchen.. 1 by mouth once daily for depression 12)  Vyvanse 50 Mg Caps (Lisdexamfetamine dimesylate) .Marland Kitchen.. 1 po  every morning 13)  Tramadol Hcl  50 Mg Tabs (Tramadol hcl) .Marland Kitchen.. 1-2 tabs by mouth two times a day as needed pain 14)  Glimepiride 10 Mg Tabs (glimepiride)  .... Take 1 po qd 15)  Acyclovir 400 Mg Tabs (Acyclovir) .Marland Kitchen.. 1 by mouth three times a day as needed herpes x 7 d 16)  Majic Mouthwash  .... 5 ml qid swish, hold and swallow qid  Patient Instructions: 1)  Call if you are not better in a reasonable amount of time or if worse.   Prescriptions: MAJIC MOUTHWASH 5 ml qid swish, hold and swallow qid  #300 ml x 0   Entered by:   Lamar Sprinkles, CMA   Authorized by:   Tresa Garter MD   Signed by:   Lamar Sprinkles, CMA on 06/12/2010   Method used:   Faxed to ...       [facility]* (retail)       [address]       [address] Shipping/mailing       [place], [place]  [address]       Ph: [phone]       Fax: [phone]   RxID:   [id number] MAJIC MOUTHWASH 5 ml qid swish, hold and swallow qid  #300 ml x 0   Entered and Authorized by:   Tresa Garter MD   Signed by:   Tresa Garter MD on 06/12/2010   Method used:   Print then Give to Patient   RxID:   [id number] ACYCLOVIR 400 MG TABS (ACYCLOVIR) 1 by mouth three times a day as needed herpes x 7 d  #21 x 1   Entered and Authorized by:   Tresa Garter MD   Signed by:   Tresa Garter MD on 06/12/2010   Method used:   Electronically to        [facility]* (retail)       [address]       [address] Shipping/mailing       [place], [place]  [address]       Ph: [phone]       Fax: [phone]   RxID:   [id number]    Orders Added: 1)  Est. Patient Level III [00938]

## 2010-07-12 ENCOUNTER — Telehealth: Payer: Self-pay | Admitting: Internal Medicine

## 2010-07-16 ENCOUNTER — Encounter: Payer: Self-pay | Admitting: Internal Medicine

## 2010-07-16 ENCOUNTER — Ambulatory Visit (INDEPENDENT_AMBULATORY_CARE_PROVIDER_SITE_OTHER)
Admission: RE | Admit: 2010-07-16 | Discharge: 2010-07-16 | Disposition: A | Discharge status: Home / Self Care | Payer: Commercial Managed Care - PPO | Source: Ambulatory Visit | Attending: Internal Medicine | Admitting: Internal Medicine

## 2010-07-16 ENCOUNTER — Other Ambulatory Visit: Payer: Self-pay | Admitting: Internal Medicine

## 2010-07-16 ENCOUNTER — Ambulatory Visit (INDEPENDENT_AMBULATORY_CARE_PROVIDER_SITE_OTHER): Payer: Commercial Managed Care - PPO | Admitting: Internal Medicine

## 2010-07-16 DIAGNOSIS — M542 Cervicalgia: Secondary | ICD-10-CM | POA: Insufficient documentation

## 2010-07-16 LAB — HM DIABETES FOOT EXAM

## 2010-07-25 NOTE — Assessment & Plan Note (Signed)
Summary: DR PLOTNIKOV PT NO SLOT--NECK PAIN   --STC   Vital Signs:  Patient profile:   45 year old male Height:      72 inches Weight:      276 pounds O2 Sat:      95 % on Room air Temp:     98.3 degrees F oral Pulse rate:   90 / minute Pulse rhythm:   regular Resp:     16 per minute BP sitting:   148 / 72  (left arm) Cuff size:   large  Vitals Entered By: Rock Nephew CMA (July 16, 2010 1:15 PM)  O2 Flow:  Room air CC: pt c/o neck pain, Neck pain Is Patient Diabetic? No Pain Assessment Patient in pain? yes     Location: neck Intensity: 7 Onset of pain  With activity  Does patient need assistance? Functional Status Self care Ambulation Normal   Primary Care Provider:  Georgina Quint Plotnikov MD  CC:  pt c/o neck pain and Neck pain.  History of Present Illness:  Neck Pain      This is a 45 year old man who presents with Neck pain.  The problem began 4 weeks ago.  The intensity is described as moderate.  The patient reports right neck pain and right shoulder pain, but denies left neck pain, midline neck pain, bilateral neck pain, left shoulder pain, bilateral shoulder pain, upper back pain, and headache.  Associated symptoms include locking, clicking, and impaired neck ROM.  The patient denies the following associated symptoms: numbness, weakness, impaired coordination, gait disturbance, tingling/parasthesias, fever, bladder dysfunction, and bowel dysfunction.  The pain is described as sharp.  The pain is better with NSAIDs.  History is significant for remote neck trauma.    Preventive Screening-Counseling & Management  Alcohol-Tobacco     Alcohol drinks/day: 0     Alcohol Counseling: not indicated; patient does not drink     Smoking Status: never     Tobacco Counseling: not indicated; no tobacco use      Sexual History:  currently monogamous.        Drug Use:  never.        Blood Transfusions:  no.    Clinical Review Panels:  Immunizations   Last Flu  Vaccine:  Fluvax 3+ (02/21/2010)  Diabetes Management   Creatinine:  0.7 (11/09/2009)   Last Foot Exam:  yes (07/16/2010)   Last Flu Vaccine:  Fluvax 3+ (02/21/2010)  Complete Metabolic Panel   Glucose:  126 (11/09/2009)   Sodium:  142 (11/09/2009)   Potassium:  4.6 (11/09/2009)   Chloride:  103 (11/09/2009)   CO2:  32 (11/09/2009)   BUN:  15 (11/09/2009)   Creatinine:  0.7 (11/09/2009)   Albumin:  4.5 (11/09/2009)   Total Protein:  7.6 (11/09/2009)   Calcium:  9.6 (11/09/2009)   Total Bili:  0.6 (11/09/2009)   Alk Phos:  51 (11/09/2009)   SGPT (ALT):  49 (11/09/2009)   SGOT (AST):  25 (11/09/2009)   Medications Prior to Update: 1)  Vitamin D 1000 Unit Tabs (Cholecalciferol) .Marland Kitchen.. 1 By Mouth Qd 2)  Wellbutrin Sr 150 Mg Xr12h-Tab (Bupropion Hcl) .Marland Kitchen.. 1 By Mouth Q Am and Q Lunch (Two Times A Day) 3)  Aspirin 81 Mg Tabs (Aspirin) .... Once Daily 4)  Lyrica 50 Mg Caps (Pregabalin) .... Take 1 By Mouth Qd 5)  Metformin Hcl 1000 Mg Tabs (Metformin Hcl) .... Take 2 At Bedtime 6)  Amlodipine Besylate 10 Mg Tabs (  Amlodipine Besylate) .... Once Daily 7)  Ramipril 10 Mg Caps (Ramipril) .... Once Daily 8)  Lantus Solostar 100 Unit/ml Soln (Insulin Glargine) .... 50 Units At Bedtime 9)  Byetta 10 Mcg Pen 10 Mcg/0.78ml Soln (Exenatide) .... Take 2 Injections Once Daily 10)  Lipitor 40 Mg Tabs (Atorvastatin Calcium) .Marland Kitchen.. 1 By Mouth Once Daily 11)  Citalopram Hydrobromide 10 Mg Tabs (Citalopram Hydrobromide) .Marland Kitchen.. 1 By Mouth Once Daily For Depression 12)  Vyvanse 50 Mg Caps (Lisdexamfetamine Dimesylate) .Marland Kitchen.. 1 Po  Every Morning 13)  Tramadol Hcl 50 Mg Tabs (Tramadol Hcl) .Marland Kitchen.. 1-2 Tabs By Mouth Two Times A Day As Needed Pain 14)  Glimepiride 10 Mg Tabs (Glimepiride) .... Take 1 Po Qd 15)  Acyclovir 400 Mg Tabs (Acyclovir) .Marland Kitchen.. 1 By Mouth Three Times A Day As Needed Herpes X 7 D  Current Medications (verified): 1)  Vitamin D 1000 Unit Tabs (Cholecalciferol) .Marland Kitchen.. 1 By Mouth Qd 2)  Wellbutrin Sr  150 Mg Xr12h-Tab (Bupropion Hcl) .Marland Kitchen.. 1 By Mouth Q Am and Q Lunch (Two Times A Day) 3)  Aspirin 81 Mg Tabs (Aspirin) .... Once Daily 4)  Lyrica 50 Mg Caps (Pregabalin) .... Take 1 By Mouth Qd 5)  Metformin Hcl 1000 Mg Tabs (Metformin Hcl) .... Take 2 At Bedtime 6)  Amlodipine Besylate 10 Mg Tabs (Amlodipine Besylate) .... Once Daily 7)  Ramipril 10 Mg Caps (Ramipril) .... Once Daily 8)  Lantus Solostar 100 Unit/ml Soln (Insulin Glargine) .... 50 Units At Bedtime 9)  Byetta 10 Mcg Pen 10 Mcg/0.62ml Soln (Exenatide) .... Take 2 Injections Once Daily 10)  Lipitor 40 Mg Tabs (Atorvastatin Calcium) .Marland Kitchen.. 1 By Mouth Once Daily 11)  Citalopram Hydrobromide 10 Mg Tabs (Citalopram Hydrobromide) .Marland Kitchen.. 1 By Mouth Once Daily For Depression 12)  Vyvanse 50 Mg Caps (Lisdexamfetamine Dimesylate) .Marland Kitchen.. 1 Po  Every Morning 13)  Tramadol Hcl 50 Mg Tabs (Tramadol Hcl) .Marland Kitchen.. 1-2 Tabs By Mouth Two Times A Day As Needed Pain 14)  Glimepiride 10 Mg Tabs (Glimepiride) .... Take 1 Po Qd 15)  Acyclovir 400 Mg Tabs (Acyclovir) .Marland Kitchen.. 1 By Mouth Three Times A Day As Needed Herpes X 7 D 16)  Robaxin 500 Mg Tabs (Methocarbamol) .... 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 Family History Diabetes 1st degree relative Family History High cholesterol Family History Hypertension Family History Lung cancer  Social History: Reviewed history from 09/07/2009 and no changes required. 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    Physical Exam  General:  alert, well-developed, well-nourished, well-hydrated, appropriate dress, normal appearance, cooperative to examination, good hygiene, and overweight-appearing.   Head:  normocephalic, atraumatic, no abnormalities observed, no abnormalities palpated, and no alopecia.   Neck:  supple, full ROM, no masses, no thyromegaly, no thyroid nodules or tenderness, no JVD, normal carotid upstroke, no carotid bruits, no cervical lymphadenopathy, and no neck tenderness.   Lungs:  normal respiratory effort, no intercostal retractions, no accessory muscle use, normal breath sounds, no dullness, no fremitus, no crackles, and no wheezes.   Heart:  normal rate, regular rhythm, no murmur, no gallop, no rub, no JVD, and no HJR.   Abdomen:  soft, non-tender, normal bowel sounds, no distention, no masses, no guarding, no rigidity, no rebound tenderness, no abdominal hernia, no inguinal hernia, no hepatomegaly, and no splenomegaly.   Msk:  normal ROM, no joint tenderness, no joint swelling, no joint warmth, no redness over joints, no joint deformities, no joint instability, and no crepitation.   Pulses:  R and L carotid,radial,femoral,dorsalis pedis and posterior tibial pulses are full and equal bilaterally Extremities:   No clubbing, cyanosis, edema, or deformity noted with normal full range of motion of all joints.   Neurologic:  No cranial nerve deficits noted. Station and gait are normal. Plantar reflexes are down-going bilaterally. DTRs are symmetrical throughout. Sensory, motor and coordinative functions appear intact. Skin:  Intact without suspicious lesions or rashes Cervical Nodes:  No lymphadenopathy noted Axillary Nodes:  No palpable lymphadenopathy Inguinal Nodes:  No significant adenopathy Psych:  Cognition and judgment appear intact. Alert and cooperative with normal attention span and concentration. No apparent delusions, illusions, hallucinations  Diabetes Management Exam:    Foot Exam (with socks and/or shoes not present):       Sensory-Pinprick/Light touch:          Left medial foot (L-4): normal          Left dorsal foot (L-5): normal          Left lateral foot (S-1): normal          Right medial foot (L-4): normal          Right dorsal foot (L-5): normal          Right lateral foot (S-1): normal       Sensory-Monofilament:          Left foot: normal          Right foot: normal       Inspection:          Left foot: normal          Right foot: normal       Nails:          Left foot: normal          Right foot: normal   Detailed Back/Spine Exam  General:    obese.    Gait:    Normal heel-toe gait pattern bilaterally.    Cervical Exam:  Inspection-deformity:    Normal Palpation-spinal tenderness:  Normal Range of Motion:    Forward Flexion:   55 degrees    Hyperextension:   50 degrees    Right Lat. Flexion:   30 degrees    Left Lat. Flexion:   55 degrees    Right Lat. Rotation:   70 degrees    Left Lat. Rotation:   85 degrees Spurling Maneuver:    negative Hoffman's Sign:    Right:  negative    Left:  negative  Impression & Recommendations:  Problem # 1:  NECK PAIN, ACUTE (ICD-723.1)  His updated medication list for this problem includes:    Aspirin 81 Mg  Tabs (Aspirin) ..... Once daily    Tramadol Hcl 50 Mg Tabs (Tramadol hcl) .Marland Kitchen... 1-2 tabs by mouth two times a day as needed pain    Robaxin 500 Mg Tabs (Methocarbamol) ..... One by mouth three times a day as needed for neck spasms  Orders: T-Cervical Spine Comp 4 Views (72050TC)  Complete Medication List: 1)  Vitamin D 1000 Unit Tabs (Cholecalciferol) .Marland Kitchen.. 1 by mouth qd 2)  Wellbutrin Sr 150 Mg Xr12h-tab (Bupropion hcl) .Marland Kitchen.. 1 by mouth q am and q lunch (two times a day) 3)  Aspirin 81 Mg Tabs (Aspirin) .... Once daily 4)  Lyrica 50 Mg Caps (Pregabalin) .... Take 1 by mouth qd 5)  Metformin Hcl 1000 Mg Tabs (Metformin hcl) .... Take 2 at bedtime 6)  Amlodipine Besylate 10 Mg Tabs (Amlodipine besylate) .... Once daily 7)  Ramipril 10 Mg Caps (Ramipril) .... Once daily 8)  Lantus Solostar 100 Unit/ml Soln (Insulin glargine) .... 50 units at bedtime 9)  Byetta 10 Mcg Pen 10 Mcg/0.19ml Soln (Exenatide) .... Take 2 injections once daily 10)  Lipitor 40 Mg Tabs (Atorvastatin calcium) .Marland Kitchen.. 1 by mouth once daily 11)  Citalopram Hydrobromide 10 Mg Tabs (Citalopram hydrobromide) .Marland Kitchen.. 1 by mouth once daily for depression 12)  Vyvanse 50 Mg Caps (Lisdexamfetamine dimesylate) .Marland Kitchen.. 1 po  every morning 13)  Tramadol Hcl 50 Mg Tabs (Tramadol hcl) .Marland Kitchen.. 1-2 tabs by mouth two times a day as needed pain 14)  Glimepiride 10 Mg Tabs (glimepiride)  .... Take 1 po qd 15)  Acyclovir 400 Mg Tabs (Acyclovir) .Marland Kitchen.. 1 by mouth three times a day as needed herpes x 7 d 16)  Robaxin 500 Mg Tabs (Methocarbamol) .... One by mouth three times a day as needed for neck spasms  Patient Instructions: 1)  Please schedule a follow-up appointment in 1 month. 2)  Take 650-1000mg  of Tylenol every 4-6 hours as needed for relief of pain or comfort of fever AVOID taking more than 4000mg   in a 24 hour period (can cause liver damage in higher doses). 3)  Most patients (90%) with low back pain will improve with time (2-6 weeks). Keep  active but avoid activities that are painful. Apply moist heat and/or ice to lower back several times a day. Prescriptions: ROBAXIN 500 MG TABS (METHOCARBAMOL) One by mouth three times a day as needed for neck spasms  #50 x 1   Entered and Authorized by:   Etta Grandchild MD   Signed by:   Etta Grandchild MD on 07/16/2010   Method used:   Electronically to        [facility]* (retail)       [address]       [address] Shipping/mailing       [place], [place]  [address]       Ph: [phone]       Fax: [phone]   RxID:   [id number]    Orders Added: 1)  T-Cervical Spine Comp 4 Views [72050TC] 2)  Est. Patient Level IV [40102]

## 2010-07-25 NOTE — Progress Notes (Signed)
Summary: RF Vyvanse  Phone Note Call from Patient Call back at Home Phone [phone]   Caller: [phone] Call For: dr Posey Rea Summary of Call: pt left message on triage a: pt needs written prescription for vyvanse, please call when ready and pt will pick up. Initial call taken by: Verdell Face,  July 12, 2010 3:37 PM  Follow-up for Phone Call        ok to ref  Follow-up by: Tresa Garter MD,  July 13, 2010 3:21 PM  Additional Follow-up for Phone Call Additional follow up Details #1::        Pt informed  Additional Follow-up by: Lamar Sprinkles, CMA,  July 16, 2010 12:37 PM    Prescriptions: VYVANSE 50 MG CAPS (LISDEXAMFETAMINE DIMESYLATE) 1 po  every morning  #90 x 0   Entered by:   Rock Nephew CMA   Authorized by:   Tresa Garter MD   Signed by:   Rock Nephew CMA on 07/16/2010   Method used:   Print then Give to Patient   RxID:   [id number]

## 2010-08-09 ENCOUNTER — Ambulatory Visit: Payer: Commercial Managed Care - PPO | Admitting: Internal Medicine

## 2010-08-20 ENCOUNTER — Telehealth: Payer: Self-pay | Admitting: Internal Medicine

## 2010-08-30 NOTE — Progress Notes (Signed)
Summary: refill request  Phone Note Refill Request Call back at Home Phone [phone] Message from:  Patient  Refills Requested: Medication #1:  WELLBUTRIN SR 150 MG XR12H-TAB 1 by mouth q am and q lunch (two times a day) Pt states that he will p/u script when ready. Can reach on (MB) [phone]    Method Requested: Pick up at Office Initial call taken by: Burnard Leigh [facility]),  August 20, 2010 8:41 AM  Follow-up for Phone Call        ok x 6 Follow-up by: Tresa Garter MD,  August 20, 2010 6:01 PM    Prescriptions: WELLBUTRIN SR 150 MG XR12H-TAB (BUPROPION HCL) 1 by mouth q am and q lunch (two times a day)  #180 x 1   Entered by:   Lamar Sprinkles, CMA   Authorized by:   Tresa Garter MD   Signed by:   Lamar Sprinkles, CMA on 08/20/2010   Method used:   Electronically to        [facility]* (retail)       [address]       [address] Shipping/mailing       [place], [place]  [address]       Ph: [phone]       Fax: [phone]   RxID:   [id number]

## 2010-08-30 NOTE — Progress Notes (Signed)
Summary: Victor Byrd  Phone Note Refill Request   Refills Requested: Medication #1:  VYVANSE 50 MG CAPS 1 po  every morning Initial call taken by: Lamar Sprinkles, CMA,  August 20, 2010 6:12 PM  Follow-up for Phone Call        ok to ref Follow-up by: Tresa Garter MD,  August 20, 2010 6:54 PM  Additional Follow-up for Phone Call Additional follow up Details #1::        Pt informed  Additional Follow-up by: Lamar Sprinkles, CMA,  August 20, 2010 6:56 PM    New/Updated Medications: VYVANSE 50 MG CAPS (LISDEXAMFETAMINE DIMESYLATE) 1 po  every morning FILL on after 10/14/2010 Prescriptions: VYVANSE 50 MG CAPS (LISDEXAMFETAMINE DIMESYLATE) 1 po  every morning FILL on after 10/14/2010  #90 x 0   Entered by:   Lamar Sprinkles, CMA   Authorized by:   Tresa Garter MD   Signed by:   Lamar Sprinkles, CMA on 08/20/2010   Method used:   Print then Give to Patient   RxID:   [id number]

## 2010-09-03 ENCOUNTER — Other Ambulatory Visit: Payer: Self-pay | Admitting: Internal Medicine

## 2010-09-11 ENCOUNTER — Telehealth: Payer: Self-pay | Admitting: *Deleted

## 2010-09-11 ENCOUNTER — Ambulatory Visit (INDEPENDENT_AMBULATORY_CARE_PROVIDER_SITE_OTHER): Payer: Commercial Managed Care - PPO | Admitting: Internal Medicine

## 2010-09-11 ENCOUNTER — Encounter: Payer: Self-pay | Admitting: Internal Medicine

## 2010-09-11 VITALS — BP 144/80 | HR 84 | Temp 99.2°F | Resp 16 | Ht 72.0 in | Wt 284.0 lb

## 2010-09-11 DIAGNOSIS — S61218A Laceration without foreign body of other finger without damage to nail, initial encounter: Secondary | ICD-10-CM

## 2010-09-11 DIAGNOSIS — S61209A Unspecified open wound of unspecified finger without damage to nail, initial encounter: Secondary | ICD-10-CM

## 2010-09-11 DIAGNOSIS — F329 Major depressive disorder, single episode, unspecified: Secondary | ICD-10-CM

## 2010-09-11 DIAGNOSIS — F3289 Other specified depressive episodes: Secondary | ICD-10-CM

## 2010-09-11 DIAGNOSIS — Z23 Encounter for immunization: Secondary | ICD-10-CM

## 2010-09-11 MED ORDER — AMOXICILLIN-POT CLAVULANATE 875-125 MG PO TABS: 1.0000 | ORAL_TABLET | Freq: Two times a day (BID) | ORAL | Status: DC

## 2010-09-11 MED ORDER — TETANUS-DIPHTH-ACELL PERTUSSIS 5-2.5-18.5 LF-MCG/0.5 IM SUSP
0.5000 mL | Freq: Once | INTRAMUSCULAR | Status: AC
  Administered 2010-09-11: 0.5 mL via INTRAMUSCULAR

## 2010-09-11 NOTE — Telephone Encounter (Signed)
Scheduled for today.

## 2010-09-11 NOTE — Progress Notes (Signed)
  Subjective:    Patient ID: Victor Byrd, male    DOB: 09-03-1965, 45 y.o.   MRN: [id number]  HPI  C/o R index fingertip wound - he sliced distal 3 mm of his  Fingertip off with a slicer about 24 hrs ago. He discarded the skin.  Review of Systems  Constitutional: Positive for fever and chills.  Hematological: Does not bruise/bleed easily.  Psychiatric/Behavioral: Negative for suicidal ideas. The patient is nervous/anxious.        Objective:   Physical Exam  Constitutional: He appears well-developed. No distress.       Obese   Musculoskeletal: He exhibits tenderness. He exhibits no edema.       R index fingertip laceration 7x8 mm in diameter with some bleeding.  Skin: No erythema. No pallor.  Psychiatric: Judgment normal.          Assessment & Plan:  Laceration of index finger Wound was washed and dressed Tetanus vaccination given  DEPRESSION Unchanged. No change in meds

## 2010-09-11 NOTE — Assessment & Plan Note (Addendum)
Wound was washed and dressed Tetanus vaccination given

## 2010-09-11 NOTE — Patient Instructions (Signed)
Keep wound clean. Use antibiotic oinment

## 2010-09-11 NOTE — Telephone Encounter (Signed)
Pt left vm - he cut the tip of his finger off when cooking last night, wants MD to eval and needs tetanus. OK to work in per MD - Left vm for patient on cell, hm and wife's cell to call office back.

## 2010-09-11 NOTE — Assessment & Plan Note (Signed)
Unchanged. No change in meds

## 2010-09-17 ENCOUNTER — Encounter: Payer: Self-pay | Admitting: Internal Medicine

## 2010-09-17 ENCOUNTER — Ambulatory Visit (INDEPENDENT_AMBULATORY_CARE_PROVIDER_SITE_OTHER): Payer: Commercial Managed Care - PPO | Admitting: Internal Medicine

## 2010-09-17 VITALS — BP 160/72 | HR 107 | Temp 99.5°F | Ht 72.0 in | Wt 281.1 lb

## 2010-09-17 DIAGNOSIS — J209 Acute bronchitis, unspecified: Secondary | ICD-10-CM

## 2010-09-17 MED ORDER — AMOXICILLIN-POT CLAVULANATE 875-125 MG PO TABS: 1.0000 | ORAL_TABLET | Freq: Two times a day (BID) | ORAL | Status: AC

## 2010-09-17 NOTE — Progress Notes (Signed)
  Subjective:     Victor Byrd is a 45 y.o. male here for evaluation of a cough. Onset of symptoms was 4 days ago. Symptoms have been gradually worsening since that time. The cough is productive of yellow and green sputum and is aggravated by pollens. Associated symptoms include: chills, sputum production and wheezing. Patient does have a history of asthma. Patient does have a history of environmental allergens. Patient has not traveled recently. Patient does not have a history of smoking. Patient has not had a previous chest x-ray. Patient has not had a PPD done.  The following portions of the patient's history were reviewed and updated as appropriate: allergies, current medications, past medical history, past social history and problem list.  Review of Systems Constitutional: negative Respiratory: positive for asthma and sputum Cardiovascular: negative for chest pain and syncope Neurological: negative for dizziness and vertigo    Objective:    Oxygen saturation 95% on room air BP 160/72  Pulse 107  Temp(Src) 99.5 F (37.5 C) (Oral)  Ht 6' (1.829 m)  Wt 281 lb 1.9 oz (127.515 kg)  BMI 38.13 kg/m2  SpO2 95%  General Appearance:    Alert, cooperative, no distress, appears stated age  Head:    Normocephalic, without obvious abnormality, atraumatic  Ears:    Normal TM's and external ear canals, both ears  Nose:   Nares normal, septum midline, mucosa normal, clear drainage; no sinus tenderness  Throat:   Lips, mucosa, and tongue normal; teeth and gums normal  Lungs:     Few rhonchi, no wheeze on auscultation bilaterally, respirations unlabored  Chest wall:    No tenderness or deformity  Heart:    Regular rate and rhythm, S1 and S2 normal, no murmur, rub   or gallop  Extremities:   Extremities normal, atraumatic, no cyanosis or edema  Skin:   Skin color, texture, turgor normal, no rashes or lesions  Lymph nodes:   Cervical, supraclavicular, and axillary nodes normal            Assessment:    Acute Bronchitis    Plan:    Antibiotics per medication orders. Avoid exposure to tobacco smoke and fumes. Call if shortness of breath worsens, blood in sputum, change in character of cough, development of fever or chills, inability to maintain nutrition and hydration. Avoid exposure to tobacco smoke and fumes.

## 2010-09-17 NOTE — Patient Instructions (Addendum)
It was good to see you today. Augmentin for bronchitis symptoms - Your prescription(s) have been submitted to your pharmacy. Please take as directed and contact our office if you believe you are having problem(s) with the medication(s). Continue cough syrup - call if asthma flares despite this treatment Place cough patient instructions here.

## 2010-09-18 ENCOUNTER — Other Ambulatory Visit: Payer: Self-pay | Admitting: *Deleted

## 2010-09-18 MED ORDER — PREGABALIN 50 MG PO CAPS: 50.0000 mg | ORAL_CAPSULE | Freq: Every day | ORAL | Status: DC

## 2010-09-19 ENCOUNTER — Ambulatory Visit: Payer: Commercial Managed Care - PPO | Admitting: Internal Medicine

## 2010-09-26 ENCOUNTER — Telehealth: Payer: Self-pay | Admitting: *Deleted

## 2010-09-26 NOTE — Telephone Encounter (Signed)
Spoke w/pt  And gave him his MRN - needed for ins forms

## 2010-10-11 ENCOUNTER — Telehealth: Payer: Self-pay | Admitting: *Deleted

## 2010-10-11 NOTE — Telephone Encounter (Signed)
EMR records show - last RX given was to fill 10/14/10 #90 - need to call [facility] outpt pharm to confirm if it is on hold and discuss with patient. (Pt has apt with plot already in June)

## 2010-10-11 NOTE — Telephone Encounter (Signed)
OK for 1,2. Thx

## 2010-10-11 NOTE — Telephone Encounter (Signed)
1. Req RF of Vyvanse 2. Req OV for re-eval of meds.

## 2010-10-12 NOTE — Telephone Encounter (Signed)
Called pharmacy- no Rx is on hold. Pt is due for Rf

## 2010-10-15 MED ORDER — LISDEXAMFETAMINE DIMESYLATE 50 MG PO CAPS: 50.0000 mg | ORAL_CAPSULE | ORAL | Status: DC

## 2010-10-15 NOTE — Telephone Encounter (Signed)
Pt was given rx to fill 10/2010 but he lost this- OK new rx per MD.

## 2010-10-19 ENCOUNTER — Other Ambulatory Visit: Payer: Self-pay | Admitting: *Deleted

## 2010-10-19 MED ORDER — ATORVASTATIN CALCIUM 40 MG PO TABS: 40.0000 mg | ORAL_TABLET | Freq: Every day | ORAL | Status: DC

## 2010-10-19 NOTE — Assessment & Plan Note (Signed)
[facility]                             PULMONARY OFFICE NOTE   ELIZAR, ALPERN                         MRN:          [id number]  DATE:05/06/2006                            DOB:          [birth year]    SLEEP MEDICINE CONSULTATION:   HISTORY OF PRESENT ILLNESS:  The patient is a 45 year old male who I  have been asked to see for obstructive sleep apnea.  The patient in  February 2007 underwent nocturnal polysomnography where he was found to  have a respiratory disturbance index of 25 events per hour and  desaturation as low at 82%.  Almost all of these events were hypopnea.  The patient ultimately underwent CPAP titration study where a CPAP of 8  cm seemed to be optimal.  The patient had difficulty with mouth-opening  and was placed on a full face mask; however, he had great difficulty  with this.  He now uses nasal pillows with a chin strap.  The patient  typically gets to bed between 11:30 and 12:00 and gets up between 7:30  and 8:00 to start his day.  He has rested about 50% of the time.  By mid  afternoon he begins to note significant inappropriate daytime sleepiness  and will often take a nap.  Earlier in the day, he only has intermittent  sleep pressure.  Of note, the patient's weight has increased about 20  pounds since his titration study.   PAST MEDICAL HISTORY:  1. Hypertension.  2. Diabetes.  3. Dyslipidemia.   MEDICATIONS:  1. Effexor 150 mg daily.  2. Metformin 2000 mg daily.  3. Amlodipine 5 mg daily.  4. Glimepiride 4 mg daily.  5. Altace 10 mg daily.  6. Aspirin 81 mg daily.  7. Naprosyn p.r.n.  8. Albuterol inhaler p.r.n.   ALLERGIES:  The patient has no known drug allergies.   SOCIAL HISTORY:  He is married and has children.  He has a history of  smoking 1 pack per week for 5 years; he has not smoked since 1989.   FAMILY HISTORY:  Noncontributory in first-degree relatives.   REVIEW OF SYSTEMS:  As per history of  present illness, also see patient  intake sheet documented in the chart.   PHYSICAL EXAM:  GENERAL:  He is an obese male in no acute distress.  Blood pressure is 138/86, pulse 97, temperature 98.3.  Weight is 272  pounds.  O2 saturation on room air is 96%.  HEENT:  Pupils are equal, round and reactive to light and accommodation.  Extraocular muscles are intact.  Nares show mild septal deviation to the  left.  Oropharynx shows significant elongation of the soft palate and  uvula with sidewall narrowing.  NECK:  Supple without JVD or lymphadenopathy.  There is no palpable  thyromegaly.  CHEST:  Totally clear.  CARDIAC:  Exam reveals regular rate and rhythm, no murmurs, rubs, or  gallops.  ABDOMEN:  Soft and nontender with good bowel sounds.  GENITAL, RECTAL AND BREASTS:  Exams were not done and not indicated.  LOWER EXTREMITIES:  Without  edema.  Good pulses distally.  There is no  calf tenderness.  NEUROLOGICAL:  Alert and oriented with no evidence of motor deficits.   IMPRESSION:  1. Moderate obstructive sleep apnea documented by prior nocturnal      polysomnogram.  The patient was initially titrated to 8 cmH2O      pressure and now feels that he has only rested about half the time.      He has put on 20 pounds since his titration study and therefore he      may need pressure.  Other consideration is whether he is opening      his mouth even with the chin strap and losing pressure and      therefore not optimally treating his obstructive sleep apnea.  At      this point in time I would to do an auto-titrate device for 2 weeks      with a download and see where his pressure needs to be; the patient      is agreeable to this approach.  I have again reiterated to him the      need to lose weight and also to not drive if he is sleepy.   PLAN:  1. Auto-titrate device for 2 weeks with download.  I will let the      patient know his optimal pressure.  2. We may have to consider going  back to a full face mask and working      on desensitization.  3. Work on weight loss.  4. The patient will follow up in 6 months or sooner if he feels      pressure optimization has not improved his sleep.     Barbaraann Share, MD,FCCP  Electronically Signed    [facility]  DD: 09/17/2006  DT: 09/17/2006  Job #: [id number]   cc:   Armanda Magic, M.D.  Thora Lance, M.D.

## 2010-11-01 ENCOUNTER — Other Ambulatory Visit: Payer: Self-pay | Admitting: Internal Medicine

## 2010-11-01 ENCOUNTER — Other Ambulatory Visit: Payer: Commercial Managed Care - PPO

## 2010-11-01 DIAGNOSIS — Z0000 Encounter for general adult medical examination without abnormal findings: Secondary | ICD-10-CM

## 2010-11-02 ENCOUNTER — Other Ambulatory Visit (INDEPENDENT_AMBULATORY_CARE_PROVIDER_SITE_OTHER): Payer: Commercial Managed Care - PPO

## 2010-11-02 ENCOUNTER — Encounter: Payer: Self-pay | Admitting: Internal Medicine

## 2010-11-02 ENCOUNTER — Other Ambulatory Visit (INDEPENDENT_AMBULATORY_CARE_PROVIDER_SITE_OTHER): Payer: Commercial Managed Care - PPO | Admitting: Internal Medicine

## 2010-11-02 DIAGNOSIS — Z0000 Encounter for general adult medical examination without abnormal findings: Secondary | ICD-10-CM

## 2010-11-02 LAB — BASIC METABOLIC PANEL
BUN: 15 mg/dL (ref 6–23)
CO2: 29 mEq/L (ref 19–32)
Calcium: 9 mg/dL (ref 8.4–10.5)
GFR: 111.95 mL/min (ref >60.00)
Glucose, Bld: 131 mg/dL — ABNORMAL HIGH (ref 70–99)
Potassium: 4.3 mEq/L (ref 3.5–5.1)
Sodium: 138 mEq/L (ref 135–145)

## 2010-11-02 LAB — URINALYSIS, ROUTINE W REFLEX MICROSCOPIC
Bilirubin Urine: NEGATIVE
Ketones, ur: NEGATIVE
Nitrite: NEGATIVE
Total Protein, Urine: NEGATIVE
Urine Glucose: NEGATIVE
pH: 6.5 (ref 5.0–8.0)

## 2010-11-02 LAB — HEPATIC FUNCTION PANEL
ALT: 40 U/L (ref 0–53)
Bilirubin, Direct: 0.2 mg/dL (ref 0.0–0.3)
Total Bilirubin: 1 mg/dL (ref 0.3–1.2)

## 2010-11-02 LAB — CBC WITH DIFFERENTIAL/PLATELET
Basophils Relative: 0.7 % (ref 0.0–3.0)
Eosinophils Relative: 2.4 % (ref 0.0–5.0)
HCT: 40.8 % (ref 39.0–52.0)
Hemoglobin: 14.1 g/dL (ref 13.0–17.0)
Lymphs Abs: 2.3 10*3/uL (ref 0.7–4.0)
MCV: 85.8 fl (ref 78.0–100.0)
Monocytes Absolute: 0.9 10*3/uL (ref 0.1–1.0)
Monocytes Relative: 9.3 % (ref 3.0–12.0)
Neutro Abs: 6.4 10*3/uL (ref 1.4–7.7)
WBC: 9.9 10*3/uL (ref 4.5–10.5)

## 2010-11-02 LAB — LIPID PANEL
HDL: 37.9 mg/dL — ABNORMAL LOW (ref >39.00)
Total CHOL/HDL Ratio: 3
VLDL: 26.6 mg/dL (ref 0.0–40.0)

## 2010-11-02 LAB — TSH: TSH: 2.44 u[IU]/mL (ref 0.35–5.50)

## 2010-11-05 ENCOUNTER — Ambulatory Visit (INDEPENDENT_AMBULATORY_CARE_PROVIDER_SITE_OTHER): Payer: Commercial Managed Care - PPO | Admitting: Internal Medicine

## 2010-11-05 ENCOUNTER — Encounter: Payer: Self-pay | Admitting: Internal Medicine

## 2010-11-05 VITALS — BP 138/72 | HR 72 | Temp 99.4°F | Resp 16 | Wt 279.0 lb

## 2010-11-05 DIAGNOSIS — N390 Urinary tract infection, site not specified: Secondary | ICD-10-CM

## 2010-11-05 DIAGNOSIS — F3289 Other specified depressive episodes: Secondary | ICD-10-CM

## 2010-11-05 DIAGNOSIS — Z0000 Encounter for general adult medical examination without abnormal findings: Secondary | ICD-10-CM

## 2010-11-05 DIAGNOSIS — F329 Major depressive disorder, single episode, unspecified: Secondary | ICD-10-CM

## 2010-11-05 DIAGNOSIS — I10 Essential (primary) hypertension: Secondary | ICD-10-CM

## 2010-11-05 DIAGNOSIS — E119 Type 2 diabetes mellitus without complications: Secondary | ICD-10-CM

## 2010-11-05 MED ORDER — VILAZODONE HCL 20 MG PO TABS: 20.0000 mg | ORAL_TABLET | Freq: Every day | ORAL | Status: DC

## 2010-11-05 NOTE — Progress Notes (Signed)
  Subjective:    Patient ID: Victor Byrd, male    DOB: [birth year], 45 y.o.   MRN: [id number]  HPI The patient is here for a wellness exam. The patient has been doing well overall without major physical  issues going on lately. The patient needs to address  chronic hypertension that has been well controlled with medicines; to address chronic  hyperlipidemia controlled with medicines as well; and to address type 2 chronic diabetes, controlled with medical treatment and diet. C/o depression - worse    Review of Systems  Constitutional: Positive for fatigue and unexpected weight change. Negative for appetite change.  HENT: Negative for nosebleeds, congestion, sore throat, sneezing, trouble swallowing and neck pain.   Eyes: Negative for itching and visual disturbance.  Respiratory: Negative for cough.   Cardiovascular: Negative for chest pain, palpitations and leg swelling.  Gastrointestinal: Negative for nausea, diarrhea, blood in stool and abdominal distention.  Genitourinary: Negative for frequency and hematuria.  Musculoskeletal: Negative for back pain, joint swelling and gait problem.  Skin: Negative for rash.  Neurological: Negative for dizziness, tremors, speech difficulty and weakness.  Psychiatric/Behavioral: Positive for dysphoric mood. Negative for suicidal ideas, sleep disturbance and agitation. The patient is nervous/anxious.        [Depressed  Wt Readings from Last 3 Encounters:  11/05/10 279 lb (126.554 kg)  09/17/10 281 lb 1.9 oz (127.515 kg)  09/11/10 284 lb (128.822 kg)        Objective:   Physical Exam  Constitutional: He is oriented to person, place, and time. He appears well-developed.       Obese  HENT:  Mouth/Throat: Oropharynx is clear and moist.  Eyes: Conjunctivae are normal. Pupils are equal, round, and reactive to light.  Neck: Normal range of motion. No JVD present. No thyromegaly present.  Cardiovascular: Normal rate, regular rhythm, normal heart  sounds and intact distal pulses.  Exam reveals no gallop and no friction rub.   No murmur heard. Pulmonary/Chest: Effort normal and breath sounds normal. No respiratory distress. He has no wheezes. He has no rales. He exhibits no tenderness.  Abdominal: Soft. Bowel sounds are normal. He exhibits no distension and no mass. There is no tenderness. There is no rebound and no guarding.  Genitourinary: Penis normal. No penile tenderness.  Musculoskeletal: Normal range of motion. He exhibits no edema and no tenderness.  Lymphadenopathy:    He has no cervical adenopathy.  Neurological: He is alert and oriented to person, place, and time. He has normal reflexes. No cranial nerve deficit. He exhibits normal muscle tone. Coordination normal.  Skin: Skin is warm and dry. No rash noted.  Psychiatric: He has a normal mood and affect. His behavior is normal. Judgment and thought content normal.        Lab Results  Component Value Date   WBC 9.9 11/02/2010   HGB 14.1 11/02/2010   HCT 40.8 11/02/2010   PLT 282.0 11/02/2010   CHOL 104 11/02/2010   TRIG 133.0 11/02/2010   HDL 37.90* 11/02/2010   ALT 40 11/02/2010   AST 36 11/02/2010   NA 138 11/02/2010   K 4.3 11/02/2010   CL 102 11/02/2010   CREATININE 0.8 11/02/2010   BUN 15 11/02/2010   CO2 29 11/02/2010   TSH 2.44 11/02/2010     Assessment & Plan:

## 2010-11-05 NOTE — Assessment & Plan Note (Signed)
On Rx 

## 2010-11-05 NOTE — Assessment & Plan Note (Addendum)
He is asking to change Wellbutrin See Meds

## 2010-11-05 NOTE — Assessment & Plan Note (Addendum)
We discussed age appropriate health related issues, including available/recomended screening tests and vaccinations. We discussed a need for adhering to healthy diet and exercise. Labs/EKG were reviewed/ordered. All questions were answered.   

## 2010-11-06 ENCOUNTER — Telehealth: Payer: Self-pay | Admitting: Internal Medicine

## 2010-11-06 MED ORDER — CIPROFLOXACIN HCL 500 MG PO TABS: 500.0000 mg | ORAL_TABLET | Freq: Two times a day (BID) | ORAL | Status: DC

## 2010-11-06 NOTE — Telephone Encounter (Signed)
Victor Byrd, please, call - we forgot abn UA - poss prostatitis - take abx x 2 wks Thx

## 2010-11-06 NOTE — Telephone Encounter (Signed)
Pt informed

## 2010-11-25 ENCOUNTER — Encounter: Payer: Self-pay | Admitting: Internal Medicine

## 2010-11-25 DIAGNOSIS — N390 Urinary tract infection, site not specified: Secondary | ICD-10-CM | POA: Insufficient documentation

## 2010-11-25 NOTE — Assessment & Plan Note (Signed)
Cont Rx 

## 2010-11-25 NOTE — Assessment & Plan Note (Signed)
ABX started

## 2010-12-31 ENCOUNTER — Telehealth: Payer: Self-pay | Admitting: *Deleted

## 2010-12-31 MED ORDER — AMLODIPINE BESYLATE 10 MG PO TABS: 10.0000 mg | ORAL_TABLET | Freq: Every day | ORAL | Status: DC

## 2010-12-31 NOTE — Telephone Encounter (Signed)
Patient requesting RF of amlodipine - Done, needs a call when complete.

## 2010-12-31 NOTE — Telephone Encounter (Signed)
Left pt vm to check w/pharm 

## 2011-01-07 ENCOUNTER — Telehealth: Payer: Self-pay | Admitting: *Deleted

## 2011-01-07 NOTE — Telephone Encounter (Signed)
Patient requesting RF of vyvanse.

## 2011-01-07 NOTE — Telephone Encounter (Signed)
OK to ref #90 1 ref Thx

## 2011-01-08 MED ORDER — LISDEXAMFETAMINE DIMESYLATE 50 MG PO CAPS: 50.0000 mg | ORAL_CAPSULE | ORAL | Status: DC

## 2011-01-08 NOTE — Telephone Encounter (Signed)
Pending signature, left pt vm to pick up RX after 1 pm today.

## 2011-02-05 ENCOUNTER — Telehealth: Payer: Self-pay

## 2011-02-05 NOTE — Telephone Encounter (Signed)
Pt called requesting medication list be faxed to his Endocrinologist at [phone]. Pt is requesting a call back once done.

## 2011-02-05 NOTE — Telephone Encounter (Signed)
Done..left left detailed mess informing pt.

## 2011-02-15 ENCOUNTER — Telehealth: Payer: Self-pay | Admitting: *Deleted

## 2011-02-15 NOTE — Telephone Encounter (Signed)
Patient requesting letter of medical necessity for bariatric surgery.

## 2011-02-21 NOTE — Telephone Encounter (Signed)
Sarah, Please reformat the letter, Thx

## 2011-02-22 NOTE — Telephone Encounter (Signed)
Letter pending signature

## 2011-02-22 NOTE — Telephone Encounter (Signed)
Left detailed mess informing pt letter was ready to be picked up.

## 2011-03-04 ENCOUNTER — Other Ambulatory Visit: Payer: Self-pay | Admitting: Internal Medicine

## 2011-03-08 ENCOUNTER — Ambulatory Visit (INDEPENDENT_AMBULATORY_CARE_PROVIDER_SITE_OTHER): Payer: Commercial Managed Care - PPO | Admitting: General Surgery

## 2011-03-08 ENCOUNTER — Other Ambulatory Visit (INDEPENDENT_AMBULATORY_CARE_PROVIDER_SITE_OTHER): Payer: Self-pay | Admitting: General Surgery

## 2011-03-08 ENCOUNTER — Encounter (INDEPENDENT_AMBULATORY_CARE_PROVIDER_SITE_OTHER): Payer: Self-pay | Admitting: General Surgery

## 2011-03-08 VITALS — BP 164/88 | HR 89 | Temp 97.8°F | Ht 71.75 in | Wt 274.6 lb

## 2011-03-08 DIAGNOSIS — E669 Obesity, unspecified: Secondary | ICD-10-CM

## 2011-03-08 DIAGNOSIS — G4733 Obstructive sleep apnea (adult) (pediatric): Secondary | ICD-10-CM

## 2011-03-08 DIAGNOSIS — I10 Essential (primary) hypertension: Secondary | ICD-10-CM

## 2011-03-08 DIAGNOSIS — E119 Type 2 diabetes mellitus without complications: Secondary | ICD-10-CM

## 2011-03-08 NOTE — Patient Instructions (Signed)
We will contact with your appts for your weight loss surgery workup

## 2011-03-08 NOTE — Progress Notes (Signed)
Chief Complaint  Patient presents with  . Pre-op Exam    lap band initial    HPI Victor Byrd is a 45 y.o. male.   HPI  45 year old obese Caucasian male is referred by his primary care physician and endocrinologist for evaluation for weight loss surgery. The patient is specifically interested in laparoscopic adjustable gastric band surgery. He states that he had a friend undergo gastric bypass surgery who had complications. His comorbidities include hypertension, dyslipidemia, insulin-dependent diabetes mellitus, gastroesophageal reflux disease, obstructive sleep apnea on CPAP, depression.   He has struggled with weight since his teenage years. He has tried several different weight loss programs without any long-term success. He has had insulin-dependent diabetes mellitus for approximately 7 years. He states that his last hemoglobin A1c was 8.2. He has also been taking Byetta.  He denies any chest pain chest pressure tobacco use or drug use. He states that he struggles with carbs. He has been under a lot of stress recently with the death of his father a few weeks ago.  Past Medical History  Diagnosis Date  . Depression   . OSA on CPAP     Dr. Talmage Nap  . Diabetes mellitus type II   . Restless legs   . Asthma   . Hypertension   . Hyperthyroidism   . Hyperlipidemia     Past Surgical History  Procedure Date  . Vasectomy 2007  . Hernia repair 1972 & 1987  . Wisdom tooth extraction 1992    Family History  Problem Relation Age of Onset  . Diabetes Other   . Hyperlipidemia Other   . Cancer Other     lung  . Hypertension Other   . Diabetes Mother     Social History History  Substance Use Topics  . Smoking status: Never Smoker   . Smokeless tobacco: Not on file  . Alcohol Use: No    No Known Allergies  Current Outpatient Prescriptions  Medication Sig Dispense Refill  . acyclovir (ZOVIRAX) 400 MG tablet Take 400 mg by mouth 3 (three) times daily as needed. For herpes X  7 days       . amLODipine (NORVASC) 10 MG tablet Take 1 tablet (10 mg total) by mouth daily.  90 tablet  1  . aspirin 81 MG tablet Take 81 mg by mouth daily.        Marland Kitchen atorvastatin (LIPITOR) 40 MG tablet Take 1 tablet (40 mg total) by mouth daily.  30 tablet  5  . buPROPion (WELLBUTRIN SR) 150 MG 12 hr tablet Take 150 mg by mouth 2 (two) times daily.        . Cholecalciferol (VITAMIN D3) 1000 UNITS tablet Take 1,000 Units by mouth daily.        . citalopram (CELEXA) 10 MG tablet TAKE 1 TABLET BY MOUTH DAILY  90 tablet  1  . Exenatide (BYETTA 10 MCG PEN [place]) Inject 2 each into the skin daily.        . insulin glargine (LANTUS SOLOSTAR) 100 UNIT/ML injection Inject 50 Units into the skin at bedtime.        Marland Kitchen lisdexamfetamine (VYVANSE) 50 MG capsule Take 1 capsule (50 mg total) by mouth every morning. Fill on or after 01/13/11  90 capsule  0  . metFORMIN (GLUCOPHAGE) 1000 MG tablet Take 2,000 mg by mouth at bedtime.        . ramipril (ALTACE) 10 MG capsule TAKE 1 CAPSULE BY MOUTH DAILY  90 capsule  2  . traMADol (ULTRAM) 50 MG tablet Take 50-100 mg by mouth 2 (two) times daily as needed.        . Vilazodone HCl (VIIBRYD) 20 MG TABS Take 20 mg by mouth daily.  30 tablet  6  . ciprofloxacin (CIPRO) 500 MG tablet Take 1 tablet (500 mg total) by mouth 2 (two) times daily.  28 tablet  1  . methocarbamol (ROBAXIN) 500 MG tablet Take 500 mg by mouth 3 (three) times daily. For neck spasms       . pregabalin (LYRICA) 50 MG capsule Take 1 capsule (50 mg total) by mouth daily.  90 capsule  2    Review of Systems Review of Systems  Constitutional: Negative for chills, activity change, fatigue and unexpected weight change.  HENT: Negative for hearing loss, nosebleeds and neck pain.   Eyes: Negative for photophobia and visual disturbance.  Respiratory: Negative for cough, shortness of breath and wheezing.        Has OSA on cpap  Cardiovascular: Negative for chest pain, palpitations and leg swelling.       No  SOB, no DOE, no orthopnea  Genitourinary: Negative for dysuria, urgency, hematuria and difficulty urinating.       Some nocturia  Musculoskeletal: Positive for arthralgias (b/l 5th fingers). Negative for joint swelling.  Neurological: Negative for dizziness, seizures, syncope and light-headedness.  Hematological: Negative.   Psychiatric/Behavioral: Negative for suicidal ideas, hallucinations, confusion and self-injury.       Father passed away last week. Has seen therapists in past-"he was just interested in getting his $40/hr".  depression    Blood pressure 164/88, pulse 89, temperature 97.8 F (36.6 C), height 5' 11.75" (1.822 m), weight 274 lb 9.6 oz (124.558 kg).  Physical Exam Physical Exam  Vitals reviewed. Constitutional: He is oriented to person, place, and time. He appears well-developed and well-nourished.       Obese, truncal obesity  HENT:  Head: Normocephalic and atraumatic.       Short thick neck  Eyes: Conjunctivae are normal. No scleral icterus.  Neck: Normal range of motion. Neck supple. No JVD present. No tracheal deviation present.  Cardiovascular: Normal rate and regular rhythm.   Pulmonary/Chest: Effort normal and breath sounds normal. No respiratory distress. He has no wheezes.  Abdominal: Soft. Bowel sounds are normal. He exhibits no distension. There is no tenderness.       +upper midline diastasis  Musculoskeletal: Normal range of motion. He exhibits no edema.  Lymphadenopathy:    He has no cervical adenopathy.  Neurological: He is alert and oriented to person, place, and time. He exhibits normal muscle tone.  Skin: Skin is warm and dry.       Some healing excoriations on b/l lower legs  Psychiatric: He has a normal mood and affect. His behavior is normal. Thought content normal.    Data Reviewed Dr Plotnikov's referral letter of medical necessity Pt's self reported diet history   Assessment    Obesity Hypertension Insulin dependent diabetes  mellitus Obstructive sleep apnea on CPAP GERD OA Dyslipidemia Depression Restless legs Syndrome    Plan    This patient certainly meets weight loss surgery criteria. I explained our weight loss surgery process and evaluation. I think it is essential this patient is screened and evaluated by a psychologist given his history of depression and the recent loss of his father.  We will check our usual lab and x-ray studies during his workup. I would like to make  sure that his thyroid function is normal given his remote history of hyperthyroidism.  We did discuss resolution and improvement of insulin-dependent diabetes mellitus with resect to laparoscopic Roux-en-Y gastric bypass versus laparoscopic adjustable gastric band placement.  We discussed that weight loss surgery is simply a tool to help him lose weight.  We discussed that he would have to alter the types of food & the amount of food that he takes in as well as the need to exercise on a regular basis in order to achieve a 40-60% excess weight loss with LAGB.  We discussed laparoscopic adjustable gastric banding. The patient was given Agricultural engineer. We discussed the risk and benefits of surgery including but not limited to bleeding, infection, injury to surrounding structures, blood clot formation such as deep venous thrombosis or pulmonary embolism, need to convert to an open procedure, band slippage, band erosion, failure to loose weight, port complications, esophageal dilatation, worsening reflux, and vitamin deficiencies. We discussed the typical post operative recovery course. We discussed that her postoperative diet will be modified for several weeks. We specifically talked about the need to be on a liquid diet for one to 2 weeks after surgery. We also discussed the typical postoperative course with a laparoscopic adjustable gastric band and the need for frequent postoperative visits to assess the volume status of the band.  We  discussed the typical expected weight loss with a laparoscopic adjustable gastric band. I explained to the patient that they can expect to lose 40-60% of their excess body weight if they are compliant with their postoperative instructions. However I did explain that some patients loose less than 40% and some patients lose more than 60% of their excess body weight.        Gaynelle Adu M 03/08/2011, 4:13 PM

## 2011-03-13 ENCOUNTER — Other Ambulatory Visit: Payer: Self-pay

## 2011-03-13 ENCOUNTER — Ambulatory Visit (HOSPITAL_COMMUNITY)
Admission: RE | Admit: 2011-03-13 | Discharge: 2011-03-13 | Disposition: A | Discharge status: Home / Self Care | Payer: 59 | Source: Ambulatory Visit | Attending: General Surgery | Admitting: General Surgery

## 2011-03-13 DIAGNOSIS — Z6837 Body mass index (BMI) 37.0-37.9, adult: Secondary | ICD-10-CM | POA: Insufficient documentation

## 2011-03-13 DIAGNOSIS — E785 Hyperlipidemia, unspecified: Secondary | ICD-10-CM | POA: Insufficient documentation

## 2011-03-13 DIAGNOSIS — K219 Gastro-esophageal reflux disease without esophagitis: Secondary | ICD-10-CM | POA: Insufficient documentation

## 2011-03-13 DIAGNOSIS — G4730 Sleep apnea, unspecified: Secondary | ICD-10-CM | POA: Insufficient documentation

## 2011-03-13 DIAGNOSIS — I10 Essential (primary) hypertension: Secondary | ICD-10-CM | POA: Insufficient documentation

## 2011-03-13 DIAGNOSIS — E119 Type 2 diabetes mellitus without complications: Secondary | ICD-10-CM | POA: Insufficient documentation

## 2011-03-19 ENCOUNTER — Encounter: Payer: Self-pay | Admitting: *Deleted

## 2011-03-19 ENCOUNTER — Other Ambulatory Visit (INDEPENDENT_AMBULATORY_CARE_PROVIDER_SITE_OTHER): Payer: Self-pay | Admitting: General Surgery

## 2011-03-19 ENCOUNTER — Encounter: Payer: Commercial Managed Care - PPO | Attending: General Surgery | Admitting: *Deleted

## 2011-03-19 DIAGNOSIS — Z01818 Encounter for other preprocedural examination: Secondary | ICD-10-CM | POA: Insufficient documentation

## 2011-03-19 DIAGNOSIS — Z713 Dietary counseling and surveillance: Secondary | ICD-10-CM | POA: Insufficient documentation

## 2011-03-19 NOTE — Patient Instructions (Signed)
   Follow Pre-Op Nutrition Goals to prepare for LAGB Surgery.   Call the [facility] at [phone] once you have been given your surgery date to enrolled in the Pre-Op Nutrition Class. You will need to attend this nutrition class 3-4 weeks prior to your surgery. 

## 2011-03-19 NOTE — Progress Notes (Signed)
  Pre-Op Assessment Visit: Pre-Operative LAGB Surgery  Medical Nutrition Therapy:  Appt start time: 0930 end time:  1030.  Patient was seen on 03/19/2011 for Pre-Operative LAGB Nutrition Assessment. Assessment and letter of approval faxed to [facility] Bariatric Surgery Program coordinator on 03/19/2011.    Handouts given during visit include:  Pre-Op Goals Handout  Patient to call for Pre-Op and Post-Op Nutrition Education at the [facility] when surgery is scheduled.

## 2011-03-20 ENCOUNTER — Ambulatory Visit (HOSPITAL_COMMUNITY)
Admission: RE | Admit: 2011-03-20 | Discharge: 2011-03-20 | Disposition: A | Discharge status: Home / Self Care | Payer: Commercial Managed Care - PPO | Source: Ambulatory Visit | Attending: General Surgery | Admitting: General Surgery

## 2011-03-20 DIAGNOSIS — Z6837 Body mass index (BMI) 37.0-37.9, adult: Secondary | ICD-10-CM | POA: Insufficient documentation

## 2011-03-20 DIAGNOSIS — K219 Gastro-esophageal reflux disease without esophagitis: Secondary | ICD-10-CM | POA: Insufficient documentation

## 2011-03-20 DIAGNOSIS — I10 Essential (primary) hypertension: Secondary | ICD-10-CM | POA: Insufficient documentation

## 2011-03-20 DIAGNOSIS — G4733 Obstructive sleep apnea (adult) (pediatric): Secondary | ICD-10-CM | POA: Insufficient documentation

## 2011-03-20 DIAGNOSIS — E119 Type 2 diabetes mellitus without complications: Secondary | ICD-10-CM | POA: Insufficient documentation

## 2011-03-20 DIAGNOSIS — K824 Cholesterolosis of gallbladder: Secondary | ICD-10-CM | POA: Insufficient documentation

## 2011-03-25 ENCOUNTER — Ambulatory Visit (HOSPITAL_COMMUNITY)
Admission: RE | Admit: 2011-03-25 | Payer: Commercial Managed Care - PPO | Source: Ambulatory Visit | Admitting: General Surgery

## 2011-03-26 ENCOUNTER — Telehealth: Payer: Self-pay | Admitting: *Deleted

## 2011-03-26 ENCOUNTER — Other Ambulatory Visit: Payer: Self-pay | Admitting: *Deleted

## 2011-03-26 MED ORDER — BUPROPION HCL ER (SR) 150 MG PO TB12: 150.0000 mg | ORAL_TABLET | Freq: Two times a day (BID) | ORAL | Status: DC

## 2011-03-26 NOTE — Telephone Encounter (Signed)
Rf req for Lyrica 50 mg 1 po qd # 90. Last filled 12/21/10. Ok to Rf?

## 2011-03-27 MED ORDER — PREGABALIN 50 MG PO CAPS: 50.0000 mg | ORAL_CAPSULE | Freq: Every day | ORAL | Status: DC

## 2011-03-27 NOTE — Telephone Encounter (Signed)
Rf phoned in.  

## 2011-03-27 NOTE — Telephone Encounter (Signed)
OK to fill this prescription with additional refills x1 Thank you!  

## 2011-03-27 NOTE — Telephone Encounter (Signed)
rx printed/pending MD sig.

## 2011-04-03 ENCOUNTER — Telehealth: Payer: Self-pay | Admitting: *Deleted

## 2011-04-03 NOTE — Telephone Encounter (Signed)
Patient requesting RF of Vyvanse.

## 2011-04-04 ENCOUNTER — Other Ambulatory Visit: Payer: Self-pay | Admitting: *Deleted

## 2011-04-04 MED ORDER — LISDEXAMFETAMINE DIMESYLATE 50 MG PO CAPS: 50.0000 mg | ORAL_CAPSULE | ORAL | Status: DC

## 2011-04-04 NOTE — Telephone Encounter (Signed)
OK to fill this prescription with additional refills x0 Thank you!  

## 2011-04-04 NOTE — Telephone Encounter (Signed)
rx printed/pending MD sig. 

## 2011-04-04 NOTE — Telephone Encounter (Signed)
Rx signed...the patient p/u.

## 2011-04-05 ENCOUNTER — Other Ambulatory Visit (INDEPENDENT_AMBULATORY_CARE_PROVIDER_SITE_OTHER): Payer: Self-pay | Admitting: General Surgery

## 2011-04-05 LAB — COMPREHENSIVE METABOLIC PANEL
ALT: 40 U/L (ref 0–53)
AST: 25 U/L (ref 0–37)
CO2: 28 mEq/L (ref 19–32)
Creat: 0.82 mg/dL (ref 0.50–1.35)
Sodium: 139 mEq/L (ref 135–145)
Total Bilirubin: 0.6 mg/dL (ref 0.3–1.2)
Total Protein: 7.5 g/dL (ref 6.0–8.3)

## 2011-04-05 LAB — TSH: TSH: 1.964 u[IU]/mL (ref 0.350–4.500)

## 2011-04-05 LAB — HEMOGLOBIN A1C: Hgb A1c MFr Bld: 7.4 % — ABNORMAL HIGH (ref <5.7)

## 2011-04-05 LAB — LIPID PANEL
Cholesterol: 106 mg/dL (ref 0–200)
LDL Cholesterol: 43 mg/dL (ref 0–99)
Total CHOL/HDL Ratio: 2.5 Ratio
VLDL: 20 mg/dL (ref 0–40)

## 2011-04-05 LAB — T4: T4, Total: 7.9 ug/dL (ref 5.0–12.5)

## 2011-04-18 ENCOUNTER — Other Ambulatory Visit: Payer: Self-pay | Admitting: Internal Medicine

## 2011-05-15 ENCOUNTER — Ambulatory Visit (INDEPENDENT_AMBULATORY_CARE_PROVIDER_SITE_OTHER): Payer: 59 | Admitting: Internal Medicine

## 2011-05-15 ENCOUNTER — Encounter: Payer: Self-pay | Admitting: Internal Medicine

## 2011-05-15 VITALS — BP 140/84 | HR 84 | Temp 98.6°F | Resp 16 | Wt 281.0 lb

## 2011-05-15 DIAGNOSIS — F909 Attention-deficit hyperactivity disorder, unspecified type: Secondary | ICD-10-CM

## 2011-05-15 DIAGNOSIS — E119 Type 2 diabetes mellitus without complications: Secondary | ICD-10-CM

## 2011-05-15 DIAGNOSIS — E669 Obesity, unspecified: Secondary | ICD-10-CM

## 2011-05-15 DIAGNOSIS — F3289 Other specified depressive episodes: Secondary | ICD-10-CM

## 2011-05-15 DIAGNOSIS — F329 Major depressive disorder, single episode, unspecified: Secondary | ICD-10-CM

## 2011-05-15 DIAGNOSIS — R0789 Other chest pain: Secondary | ICD-10-CM

## 2011-05-15 DIAGNOSIS — I10 Essential (primary) hypertension: Secondary | ICD-10-CM

## 2011-05-15 NOTE — Assessment & Plan Note (Signed)
Continue with current prescription therapy as reflected on the Med list.  

## 2011-05-15 NOTE — Assessment & Plan Note (Signed)
Card cons 

## 2011-05-15 NOTE — Progress Notes (Signed)
  Subjective:    Patient ID: Victor Byrd, male    DOB: Jun 09, 1965, 45 y.o.   MRN: [id number]  HPI  The patient presents for a follow-up of  chronic hypertension, chronic dyslipidemia, type 2 diabetes controlled with medicines  F/u on morbid obesity  Review of Systems  Constitutional: Negative for appetite change, fatigue and unexpected weight change.  HENT: Negative for nosebleeds, congestion, sore throat, sneezing, trouble swallowing and neck pain.   Eyes: Negative for itching and visual disturbance.  Respiratory: Negative for cough.   Cardiovascular: Negative for chest pain, palpitations and leg swelling.  Gastrointestinal: Negative for nausea, diarrhea, blood in stool and abdominal distention.  Genitourinary: Negative for frequency and hematuria.  Musculoskeletal: Negative for back pain, joint swelling and gait problem.  Skin: Negative for rash.  Neurological: Negative for dizziness, tremors, speech difficulty and weakness.  Psychiatric/Behavioral: Negative for suicidal ideas, confusion, sleep disturbance, dysphoric mood and agitation. The patient is not nervous/anxious.        Objective:   Physical Exam  Constitutional: He is oriented to person, place, and time. He appears well-developed. No distress.       obese  HENT:  Mouth/Throat: Oropharynx is clear and moist.  Eyes: Conjunctivae are normal. Pupils are equal, round, and reactive to light.  Neck: Normal range of motion. No JVD present. No thyromegaly present.  Cardiovascular: Normal rate, regular rhythm, normal heart sounds and intact distal pulses.  Exam reveals no gallop and no friction rub.   No murmur heard. Pulmonary/Chest: Effort normal and breath sounds normal. No respiratory distress. He has no wheezes. He has no rales. He exhibits no tenderness.  Abdominal: Soft. Bowel sounds are normal. He exhibits no distension and no mass. There is no tenderness. There is no rebound and no guarding.  Musculoskeletal: Normal  range of motion. He exhibits no edema and no tenderness.  Lymphadenopathy:    He has no cervical adenopathy.  Neurological: He is alert and oriented to person, place, and time. He has normal reflexes. No cranial nerve deficit. He exhibits normal muscle tone. Coordination normal.  Skin: Skin is warm and dry. No rash noted.  Psychiatric: He has a normal mood and affect. His behavior is normal. Judgment and thought content normal.          Assessment & Plan:

## 2011-05-27 ENCOUNTER — Telehealth: Payer: Self-pay

## 2011-05-27 NOTE — Telephone Encounter (Signed)
error 

## 2011-06-05 ENCOUNTER — Other Ambulatory Visit: Payer: Self-pay | Admitting: Internal Medicine

## 2011-06-12 ENCOUNTER — Ambulatory Visit (INDEPENDENT_AMBULATORY_CARE_PROVIDER_SITE_OTHER): Payer: 59 | Admitting: Cardiology

## 2011-06-12 ENCOUNTER — Telehealth: Payer: Self-pay

## 2011-06-12 ENCOUNTER — Encounter: Payer: Self-pay | Admitting: Cardiology

## 2011-06-12 VITALS — BP 155/90 | HR 92 | Ht 72.0 in | Wt 283.8 lb

## 2011-06-12 DIAGNOSIS — I10 Essential (primary) hypertension: Secondary | ICD-10-CM

## 2011-06-12 DIAGNOSIS — R0789 Other chest pain: Secondary | ICD-10-CM

## 2011-06-12 DIAGNOSIS — E785 Hyperlipidemia, unspecified: Secondary | ICD-10-CM

## 2011-06-12 DIAGNOSIS — E119 Type 2 diabetes mellitus without complications: Secondary | ICD-10-CM

## 2011-06-12 MED ORDER — ALBUTEROL 90 MCG/ACT IN AERS: 2.0000 | INHALATION_SPRAY | Freq: Four times a day (QID) | RESPIRATORY_TRACT | Status: DC | PRN

## 2011-06-12 NOTE — Assessment & Plan Note (Signed)
Chest pain is quite atypical. He does , however, have multiple cardiac risk factors including diabetes, hypertension, hyperlipidemia, obesity, and family history of sudden death. I think it would be reasonable to do a cardiac evaluation with a stress echo to further stratify his risk. We will schedule this today. If normal then I would continue with aggressive risk factor modification.

## 2011-06-12 NOTE — Telephone Encounter (Signed)
OK to fill this prescription with additional refills x5 Thank you!  

## 2011-06-12 NOTE — Progress Notes (Signed)
Victor Byrd Date of Birth: 1965/11/01 Medical Record #[id number]  History of Present Illness: Victor Byrd is seen at the request of Dr. Posey Rea off for cardiac evaluation. He is a 46 year old white male who has multiple cardiac risk factors. Recently he has been experiencing symptoms of chest pain. He describes is as a right pectoral pain that feels like a muscle pull. This usually is most noticeable in the early morning. Does not appear to be related to exertion. He has been under a great deal of stress. He has been trying to get approved for gastric bypass surgery but reports he has not been cleared by psychology. He denies any shortness of breath. He's had no significant palpitations.  Current Outpatient Prescriptions on File Prior to Visit  Medication Sig Dispense Refill  . acyclovir (ZOVIRAX) 400 MG tablet Take 400 mg by mouth 3 (three) times daily as needed. For herpes X 7 days       . amLODipine (NORVASC) 10 MG tablet Take 1 tablet (10 mg total) by mouth daily.  90 tablet  1  . aspirin 81 MG tablet Take 81 mg by mouth daily.        Marland Kitchen atorvastatin (LIPITOR) 40 MG tablet TAKE 1 TABLET (40 MG TOTAL) BY MOUTH DAILY.  30 tablet  5  . buPROPion (WELLBUTRIN SR) 150 MG 12 hr tablet Take 1 tablet (150 mg total) by mouth 2 (two) times daily.  180 tablet  2  . Cholecalciferol (VITAMIN D3) 1000 UNITS tablet Take 1,000 Units by mouth daily.        . citalopram (CELEXA) 10 MG tablet TAKE 1 TABLET BY MOUTH DAILY  90 tablet  1  . Exenatide (BYETTA 10 MCG PEN [place]) Inject 2 each into the skin daily.        . insulin glargine (LANTUS SOLOSTAR) 100 UNIT/ML injection Inject 40 Units into the skin once. Taking every morning      . lisdexamfetamine (VYVANSE) 50 MG capsule Take 1 capsule (50 mg total) by mouth every morning. Fill on or after 01/13/11  90 capsule  0  . metFORMIN (GLUCOPHAGE) 1000 MG tablet Take 2,000 mg by mouth at bedtime.        . methocarbamol (ROBAXIN) 500 MG tablet Take 500 mg by mouth 3  (three) times daily. For neck spasms       . pregabalin (LYRICA) 50 MG capsule Take 1 capsule (50 mg total) by mouth daily.  90 capsule  1  . ramipril (ALTACE) 10 MG capsule TAKE 1 CAPSULE BY MOUTH DAILY  90 capsule  2  . traMADol (ULTRAM) 50 MG tablet Take 50-100 mg by mouth 2 (two) times daily as needed.        . ciprofloxacin (CIPRO) 500 MG tablet Take 1 tablet (500 mg total) by mouth 2 (two) times daily.  28 tablet  1    No Known Allergies  Past Medical History  Diagnosis Date  . Depression   . OSA on CPAP     Dr. Talmage Nap  . Diabetes mellitus type II   . Restless legs   . Asthma   . Hypertension   . Hyperthyroidism   . Hyperlipidemia     Past Surgical History  Procedure Date  . Vasectomy 2007  . Hernia repair 1972 & 1987  . Wisdom tooth extraction 1992    History  Smoking status  . Never Smoker   Smokeless tobacco  . Not on file    History  Alcohol  Use  . Yes    Family History  Problem Relation Age of Onset  . Diabetes Other   . Hyperlipidemia Other   . Cancer Other     lung  . Hypertension Other   . Diabetes Mother   . Heart disease Father     MI, HTN    Review of Systems: The review of systems is positive for stress and anxiety. His father died suddenly in 2023-03-21 and this has worried him. He states about is his blood pressure and his blood sugar has not been well controlled recently.  All other systems were reviewed and are negative.  Physical Exam: BP 155/90  Pulse 92  Ht 6' (1.829 m)  Wt 128.731 kg (283 lb 12.8 oz)  BMI 38.49 kg/m2 He is an obese white male who appears anxious but in no acute distress.The patient is alert and oriented x 3.  The mood and affect are normal.  The skin is warm and dry.  Color is normal.  The HEENT exam reveals that the sclera are nonicteric.  The mucous membranes are moist.  The carotids are 2+ without bruits.  There is no thyromegaly.  There is no JVD.  The lungs are clear.  The chest wall is non tender.  The heart  exam reveals a regular rate with a normal S1 and S2.  There are no murmurs, gallops, or rubs.  The PMI is not displaced.   Abdominal exam reveals good bowel sounds.  There is no guarding or rebound.  There is no hepatosplenomegaly or tenderness.  There are no masses.  Exam of the legs reveal no clubbing, cyanosis, or edema.  The legs are without rashes.  The distal pulses are intact.  Cranial nerves II - XII are intact.  Motor and sensory functions are intact.  The gait is normal.  LABORATORY DATA: ECG today is normal.  Assessment / Plan:

## 2011-06-12 NOTE — Patient Instructions (Signed)
We will schedule you for a stress Echo to assess your cardiac risk.   

## 2011-06-12 NOTE — Telephone Encounter (Signed)
Pt called requesting a refill of Ventolin inhaler he was prescribed by a previous MD that has since expired. Okay to Rx?

## 2011-06-12 NOTE — Telephone Encounter (Signed)
Pt advised.

## 2011-06-13 ENCOUNTER — Other Ambulatory Visit: Payer: Self-pay | Admitting: *Deleted

## 2011-06-13 MED ORDER — ALBUTEROL 90 MCG/ACT IN AERS: 2.0000 | INHALATION_SPRAY | Freq: Four times a day (QID) | RESPIRATORY_TRACT | Status: DC | PRN

## 2011-06-21 ENCOUNTER — Ambulatory Visit (HOSPITAL_COMMUNITY): Payer: 59 | Attending: Cardiovascular Disease | Admitting: Radiology

## 2011-06-21 ENCOUNTER — Ambulatory Visit (HOSPITAL_BASED_OUTPATIENT_CLINIC_OR_DEPARTMENT_OTHER): Payer: 59 | Admitting: Radiology

## 2011-06-21 DIAGNOSIS — I10 Essential (primary) hypertension: Secondary | ICD-10-CM | POA: Insufficient documentation

## 2011-06-21 DIAGNOSIS — R0789 Other chest pain: Secondary | ICD-10-CM

## 2011-06-21 DIAGNOSIS — R072 Precordial pain: Secondary | ICD-10-CM

## 2011-06-21 DIAGNOSIS — R079 Chest pain, unspecified: Secondary | ICD-10-CM | POA: Insufficient documentation

## 2011-06-21 DIAGNOSIS — G4730 Sleep apnea, unspecified: Secondary | ICD-10-CM | POA: Insufficient documentation

## 2011-06-21 DIAGNOSIS — E785 Hyperlipidemia, unspecified: Secondary | ICD-10-CM | POA: Insufficient documentation

## 2011-06-21 DIAGNOSIS — Z6838 Body mass index (BMI) 38.0-38.9, adult: Secondary | ICD-10-CM | POA: Insufficient documentation

## 2011-06-21 DIAGNOSIS — E119 Type 2 diabetes mellitus without complications: Secondary | ICD-10-CM | POA: Insufficient documentation

## 2011-06-21 DIAGNOSIS — R0989 Other specified symptoms and signs involving the circulatory and respiratory systems: Secondary | ICD-10-CM

## 2011-07-15 ENCOUNTER — Telehealth: Payer: Self-pay | Admitting: *Deleted

## 2011-07-15 NOTE — Telephone Encounter (Signed)
Pt is requesting Rf on Vyvanse. Please advise.

## 2011-07-15 NOTE — Telephone Encounter (Signed)
OK. Thx

## 2011-07-16 MED ORDER — LISDEXAMFETAMINE DIMESYLATE 50 MG PO CAPS: 50.0000 mg | ORAL_CAPSULE | ORAL | Status: DC

## 2011-07-16 NOTE — Telephone Encounter (Signed)
Patient notified.rx placed up front.

## 2011-07-16 NOTE — Telephone Encounter (Signed)
Rx printed/pending MD sig. 

## 2011-07-17 ENCOUNTER — Ambulatory Visit (INDEPENDENT_AMBULATORY_CARE_PROVIDER_SITE_OTHER): Payer: 59 | Admitting: Internal Medicine

## 2011-07-17 ENCOUNTER — Encounter: Payer: Self-pay | Admitting: Internal Medicine

## 2011-07-17 DIAGNOSIS — I10 Essential (primary) hypertension: Secondary | ICD-10-CM

## 2011-07-17 DIAGNOSIS — J069 Acute upper respiratory infection, unspecified: Secondary | ICD-10-CM

## 2011-07-17 DIAGNOSIS — G4733 Obstructive sleep apnea (adult) (pediatric): Secondary | ICD-10-CM

## 2011-07-17 DIAGNOSIS — E119 Type 2 diabetes mellitus without complications: Secondary | ICD-10-CM

## 2011-07-17 DIAGNOSIS — E669 Obesity, unspecified: Secondary | ICD-10-CM

## 2011-07-17 MED ORDER — AZITHROMYCIN 250 MG PO TABS: ORAL_TABLET | ORAL | Status: AC

## 2011-07-17 NOTE — Assessment & Plan Note (Signed)
Continue with current prescription therapy as reflected on the Med list. Check BP at home  

## 2011-07-17 NOTE — Assessment & Plan Note (Signed)
Hope he will sch the lap band soon

## 2011-07-17 NOTE — Assessment & Plan Note (Signed)
Zpac 

## 2011-07-17 NOTE — Assessment & Plan Note (Signed)
Continue with current prescription therapy as reflected on the Med list.  

## 2011-07-17 NOTE — Progress Notes (Signed)
  Subjective:    Patient ID: Victor Byrd, male    DOB: [birth year], 46 y.o.   MRN: [id number]  HPI   HPI  C/o URI sx's x   5 days. C/o ST, cough, weakness. Not better with OTC medicines. Actually, the patient is getting worse. The patient did not sleep last night due to cough. F/u DM, HTN, obesity  Review of Systems  Constitutional: Positive for fever, chills and fatigue.  HENT: Positive for congestion, rhinorrhea, sneezing and postnasal drip.   Eyes: Positive for photophobia and pain. Negative for discharge and visual disturbance.  Respiratory: Positive for cough  Gastrointestinal: Negative for vomiting, abdominal pain, diarrhea and abdominal distention.  Genitourinary: Negative for dysuria and difficulty urinating.  Skin: Negative for rash.  Neurological:       Review of Systems     Objective:   Physical Exam  Constitutional: He is oriented to person, place, and time. He appears well-developed. No distress.       obese  HENT:  Mouth/Throat: Oropharynx is clear and moist.       eryth throat  Eyes: Conjunctivae are normal. Pupils are equal, round, and reactive to light.  Neck: Normal range of motion. No JVD present. No thyromegaly present.  Cardiovascular: Normal rate, regular rhythm, normal heart sounds and intact distal pulses.  Exam reveals no gallop and no friction rub.   No murmur heard. Pulmonary/Chest: Effort normal and breath sounds normal. No respiratory distress. He has no wheezes. He has no rales. He exhibits no tenderness.  Abdominal: Soft. Bowel sounds are normal. He exhibits no distension and no mass. There is no tenderness. There is no rebound and no guarding.  Musculoskeletal: Normal range of motion. He exhibits no edema and no tenderness.  Lymphadenopathy:    He has no cervical adenopathy.  Neurological: He is alert and oriented to person, place, and time. He has normal reflexes. No cranial nerve deficit. He exhibits normal muscle tone. Coordination  normal.  Skin: Skin is warm and dry. No rash noted. He is not diaphoretic.  Psychiatric: He has a normal mood and affect. His behavior is normal. Judgment and thought content normal.          Assessment & Plan:

## 2011-07-20 ENCOUNTER — Encounter: Payer: Self-pay | Admitting: Internal Medicine

## 2011-07-26 ENCOUNTER — Telehealth: Payer: Self-pay | Admitting: *Deleted

## 2011-07-26 MED ORDER — TRAMADOL HCL 50 MG PO TABS: 50.0000 mg | ORAL_TABLET | Freq: Two times a day (BID) | ORAL | Status: DC | PRN

## 2011-07-26 NOTE — Telephone Encounter (Signed)
OK to fill this prescription with additional refills x0 Thank you!  

## 2011-07-26 NOTE — Telephone Encounter (Signed)
Rf req for tramadol 50 mg 1-2 po bid prn pain. # 100. Last filled 12/02/10. Ok to Rf?

## 2011-09-06 ENCOUNTER — Other Ambulatory Visit: Payer: Self-pay | Admitting: Internal Medicine

## 2011-09-23 ENCOUNTER — Other Ambulatory Visit: Payer: Self-pay | Admitting: *Deleted

## 2011-09-23 MED ORDER — PREGABALIN 50 MG PO CAPS: 50.0000 mg | ORAL_CAPSULE | Freq: Every day | ORAL | Status: DC

## 2011-10-02 ENCOUNTER — Encounter: Payer: Self-pay | Admitting: Internal Medicine

## 2011-10-02 ENCOUNTER — Ambulatory Visit (INDEPENDENT_AMBULATORY_CARE_PROVIDER_SITE_OTHER): Payer: 59 | Admitting: Internal Medicine

## 2011-10-02 VITALS — BP 138/84 | HR 80 | Temp 98.7°F | Resp 16 | Wt 286.0 lb

## 2011-10-02 DIAGNOSIS — R0789 Other chest pain: Secondary | ICD-10-CM | POA: Insufficient documentation

## 2011-10-02 DIAGNOSIS — R071 Chest pain on breathing: Secondary | ICD-10-CM

## 2011-10-02 DIAGNOSIS — M940 Chondrocostal junction syndrome [Tietze]: Secondary | ICD-10-CM | POA: Insufficient documentation

## 2011-10-02 MED ORDER — IBUPROFEN 600 MG PO TABS: ORAL_TABLET | ORAL | Status: AC

## 2011-10-02 NOTE — Progress Notes (Signed)
Patient ID: Victor Byrd, male   DOB: Jul 23, 1965, 46 y.o.   MRN: [id number]  Subjective:    Patient ID: Victor Byrd, male    DOB: 07-23-1965, 46 y.o.   MRN: [id number]  Chest Pain       F/u DM, HTN, obesity  Review of Systems  Constitutional: Neg for fever, chills and fatigue.  HENT: Neg for congestion, rhinorrhea, sneezing and postnasal drip.   Eyes: Neg for photophobia and pain. Negative for discharge and visual disturbance.  Respiratory: Neg for cough  Gastrointestinal: Negative for vomiting, abdominal pain, diarrhea and abdominal distention.  Genitourinary: Negative for dysuria and difficulty urinating.  Skin: Negative for rash.  Neurological:       Review of Systems  Cardiovascular: Positive for chest pain.       Objective:   Physical Exam  Constitutional: He is oriented to person, place, and time. He appears well-developed. No distress.       obese  HENT:  Mouth/Throat: Oropharynx is clear and moist.       eryth throat  Eyes: Conjunctivae are normal. Pupils are equal, round, and reactive to light.  Neck: Normal range of motion. No JVD present. No thyromegaly present.  Cardiovascular: Normal rate, regular rhythm, normal heart sounds and intact distal pulses.  Exam reveals no gallop and no friction rub.   No murmur heard.      R upper anter chest wall is tender to palp  Pulmonary/Chest: Effort normal and breath sounds normal. No respiratory distress. He has no wheezes. He has no rales. He exhibits no tenderness.  Abdominal: Soft. Bowel sounds are normal. He exhibits no distension and no mass. There is no tenderness. There is no rebound and no guarding.  Musculoskeletal: Normal range of motion. He exhibits no edema and no tenderness.  Lymphadenopathy:    He has no cervical adenopathy.  Neurological: He is alert and oriented to person, place, and time. He has normal reflexes. No cranial nerve deficit. He exhibits normal muscle tone. Coordination normal.  Skin:  Skin is warm and dry. No rash noted. He is not diaphoretic.  Psychiatric: He has a normal mood and affect. His behavior is normal. Judgment and thought content normal.          Assessment & Plan:

## 2011-10-02 NOTE — Assessment & Plan Note (Signed)
5/13 R chest wall

## 2011-10-02 NOTE — Assessment & Plan Note (Signed)
5/13 R costochondritis

## 2011-10-08 ENCOUNTER — Telehealth: Payer: Self-pay

## 2011-10-08 ENCOUNTER — Encounter: Payer: Self-pay | Admitting: Internal Medicine

## 2011-10-08 NOTE — Telephone Encounter (Signed)
Ok Thx 

## 2011-10-08 NOTE — Telephone Encounter (Signed)
Patient called [facility] requesting to pick up Vyvanse refill. Please advise if ok

## 2011-10-10 MED ORDER — LISDEXAMFETAMINE DIMESYLATE 50 MG PO CAPS: 50.0000 mg | ORAL_CAPSULE | ORAL | Status: DC

## 2011-10-10 NOTE — Telephone Encounter (Signed)
Will print Rx and hold for AVP's return on Fri AM. Pt informed to p/u then.

## 2011-10-21 ENCOUNTER — Telehealth: Payer: Self-pay | Admitting: *Deleted

## 2011-10-21 NOTE — Telephone Encounter (Signed)
PA number for [employer] rx ins. [phone]. PA for Vyvanse approved 10/18/11-06/02/2038. Pharmacy informed by ins. I left detailed mess informing pt.

## 2011-11-22 ENCOUNTER — Other Ambulatory Visit: Payer: Self-pay | Admitting: Internal Medicine

## 2012-01-06 ENCOUNTER — Telehealth: Payer: Self-pay | Admitting: Internal Medicine

## 2012-01-06 MED ORDER — METHOCARBAMOL 500 MG PO TABS: 500.0000 mg | ORAL_TABLET | Freq: Three times a day (TID) | ORAL | Status: DC

## 2012-01-06 NOTE — Telephone Encounter (Signed)
Caller: Tyeler/Patient; PCP: Plotnikov, Alex; CB#: [phone]; ; ; Call regarding Follow Up Prescription Request;  01-06-12 he is calling back about his request for presciption for Robaxin.  I looked in Epic and told him it was in his doctor's box to take care of it and there was a chance may still be called in this evening  to check with his pharmacy later

## 2012-01-06 NOTE — Telephone Encounter (Signed)
Noted OK Robaxin Thx

## 2012-01-06 NOTE — Telephone Encounter (Signed)
Caller: Domani/Patient; PCP: Plotnikov, Alex; CB#: [phone];  Call regarding Needs Refill On Muscle Relaxer: ROBAXIN 500 MGS 1 po TID. Pulled Muscle in Neck 2-3 Weeks Ago after moving furniture and putting in new floor. Everytime he moves neck and lays on shoulder blade it hurts/ACHES.  He has tried Ibprofen, Naproprex and Ultram and not helping. He has used ice packs for 3 days and still hurts. Trouble sleeping d/t pain/discomfort.  HE DOES NOT HAVE INSURANCE AND WILL HAVE TO PAY OUT OF POCKET. REQUSTING THAT MEDICATION BE CALLED IN TO [facility] IN [place] [phone].

## 2012-01-07 MED ORDER — METHOCARBAMOL 500 MG PO TABS: 500.0000 mg | ORAL_TABLET | Freq: Three times a day (TID) | ORAL | Status: DC

## 2012-01-07 NOTE — Telephone Encounter (Signed)
Pt advised of Rx/pharmacy 

## 2012-01-08 ENCOUNTER — Telehealth: Payer: Self-pay | Admitting: Internal Medicine

## 2012-01-08 NOTE — Telephone Encounter (Signed)
Caller: Victor Byrd/Patient; PCP: Plotnikov, Alex; CB#: [phone]; ; ; Call regarding Prescriptions Need To Be Sent To [facility]; [facility] pharmacy charges out of pocket and are cheaper at [facility].  States needs refills sent to [facility].  Currently is out of norvasc 10 milligrams, and does not want to refill all at once, so will call for refills as they come up.  Requesting 90-day refill.  States is out of medication.  Per Epic, last written norvasc Rx was July 2012; info to office for provider review/Rx.   May reach patient at [phone].

## 2012-01-09 MED ORDER — AMLODIPINE BESYLATE 10 MG PO TABS: 10.0000 mg | ORAL_TABLET | Freq: Every day | ORAL | Status: DC

## 2012-01-15 ENCOUNTER — Telehealth: Payer: Self-pay | Admitting: Internal Medicine

## 2012-01-15 NOTE — Telephone Encounter (Signed)
Yes, it is [facility]. Pls provide w/the list. Thx

## 2012-01-15 NOTE — Telephone Encounter (Signed)
Caller: [name]/Patient; Patient Name: Victor Byrd; PCP: Sonda Primes; Best Callback Phone Number: [phone].  Called to request prescription medication list; will come by office 01/16/12 to pick it up.  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  Asking if current medications may have caused positive test?  Denies use of street drugs. Information noted and sent to [facility] CAN pool for follow up for adult with questions about prescribed medications not covered by available resources per Medication Questions Guideline.

## 2012-01-16 NOTE — Telephone Encounter (Signed)
List placed upfront for pt to pick up

## 2012-01-24 ENCOUNTER — Other Ambulatory Visit: Payer: Self-pay | Admitting: Internal Medicine

## 2012-01-29 ENCOUNTER — Encounter: Payer: Self-pay | Admitting: Internal Medicine

## 2012-01-29 ENCOUNTER — Ambulatory Visit (INDEPENDENT_AMBULATORY_CARE_PROVIDER_SITE_OTHER): Payer: Self-pay | Admitting: Internal Medicine

## 2012-01-29 VITALS — BP 178/80 | HR 84 | Temp 98.6°F | Resp 16 | Wt 281.0 lb

## 2012-01-29 DIAGNOSIS — R071 Chest pain on breathing: Secondary | ICD-10-CM

## 2012-01-29 DIAGNOSIS — F909 Attention-deficit hyperactivity disorder, unspecified type: Secondary | ICD-10-CM

## 2012-01-29 DIAGNOSIS — R0789 Other chest pain: Secondary | ICD-10-CM

## 2012-01-29 MED ORDER — IBUPROFEN 600 MG PO TABS: 600.0000 mg | ORAL_TABLET | Freq: Three times a day (TID) | ORAL | Status: AC | PRN

## 2012-01-29 MED ORDER — OXYCODONE-ACETAMINOPHEN 10-325 MG PO TABS: 1.0000 | ORAL_TABLET | Freq: Three times a day (TID) | ORAL | Status: DC

## 2012-01-29 NOTE — Progress Notes (Signed)
Subjective:    Patient ID: Victor Byrd, male    DOB: February 07, 1966, 46 y.o.   MRN: [id number]  Arm Pain  The incident occurred more than 1 week ago. The incident occurred at home. The injury mechanism was twisted. The pain is present in the left shoulder. The quality of the pain is described as stabbing. The pain radiates to the chest, left hand and left arm. The pain is at a severity of 8/10. The pain is severe. The pain has been fluctuating since the incident. Associated symptoms include chest pain. The symptoms are aggravated by movement. He has tried acetaminophen, NSAIDs and immobilization for the symptoms. The treatment provided mild relief.  Chest Pain  This is a new problem. The current episode started more than 1 month ago. The onset quality is sudden. The problem occurs daily. The problem has been waxing and waning. The pain is at a severity of 8/10. The pain is severe. The quality of the pain is described as sharp. The pain radiates to the left arm. Associated symptoms include back pain. Pertinent negatives include no cough, dizziness, nausea, palpitations or weakness. The treatment provided mild relief.      F/u DM, HTN, obesity  BP Readings from Last 3 Encounters:  01/29/12 178/80  10/02/11 138/84  07/17/11 150/80   Wt Readings from Last 3 Encounters:  01/29/12 281 lb (127.461 kg)  10/02/11 286 lb (129.729 kg)  07/17/11 279 lb (126.554 kg)       Review of Systems  Constitutional: Negative for appetite change, fatigue and unexpected weight change.  HENT: Positive for neck pain. Negative for nosebleeds, congestion, sore throat, sneezing and trouble swallowing.   Eyes: Negative for itching and visual disturbance.  Respiratory: Negative for cough.   Cardiovascular: Positive for chest pain. Negative for palpitations and leg swelling.  Gastrointestinal: Negative for nausea, diarrhea, blood in stool and abdominal distention.  Genitourinary: Negative for frequency and  hematuria.  Musculoskeletal: Positive for back pain. Negative for joint swelling and gait problem.  Skin: Negative for rash.  Neurological: Negative for dizziness, tremors, speech difficulty and weakness.  Psychiatric/Behavioral: Negative for suicidal ideas, disturbed wake/sleep cycle, dysphoric mood and agitation. The patient is not nervous/anxious and is not hyperactive.        Objective:   Physical Exam  Constitutional: He is oriented to person, place, and time. He appears well-developed. No distress.       obese  HENT:  Mouth/Throat: Oropharynx is clear and moist.       eryth throat  Eyes: Conjunctivae are normal. Pupils are equal, round, and reactive to light.  Neck: Normal range of motion. No JVD present. No thyromegaly present.  Cardiovascular: Normal rate, regular rhythm, normal heart sounds and intact distal pulses.  Exam reveals no gallop and no friction rub.   No murmur heard.      L posterior chest wall in the rhomboid muscles area  Pulmonary/Chest: Effort normal and breath sounds normal. No respiratory distress. He has no wheezes. He has no rales. He exhibits no tenderness.  Abdominal: Soft. Bowel sounds are normal. He exhibits no distension and no mass. There is no tenderness. There is no rebound and no guarding.  Musculoskeletal: Normal range of motion. He exhibits no edema and no tenderness.  Lymphadenopathy:    He has no cervical adenopathy.  Neurological: He is alert and oriented to person, place, and time. He has normal reflexes. No cranial nerve deficit. He exhibits normal muscle tone. Coordination normal.  Skin: Skin is warm and dry. No rash noted. He is not diaphoretic.  Psychiatric: He has a normal mood and affect. His behavior is normal. Judgment and thought content normal.    Lab Results  Component Value Date   WBC 9.9 11/02/2010   HGB 14.1 11/02/2010   HCT 40.8 11/02/2010   PLT 282.0 11/02/2010   GLUCOSE 129* 04/05/2011   CHOL 106 04/05/2011   TRIG 100 04/05/2011     HDL 43 04/05/2011   LDLCALC 43 04/05/2011   ALT 40 04/05/2011   AST 25 04/05/2011   NA 139 04/05/2011   K 4.3 04/05/2011   CL 102 04/05/2011   CREATININE 0.82 04/05/2011   BUN 15 04/05/2011   CO2 28 04/05/2011   TSH 1.964 04/05/2011   HGBA1C 7.4* 04/05/2011         Assessment & Plan:

## 2012-01-29 NOTE — Assessment & Plan Note (Signed)
8/13 L posterior -- rhomboid muscle strain Percocet prn If problems w/L arm pain or paresthesia develop - get cervical spine X ray

## 2012-01-30 ENCOUNTER — Encounter: Payer: Self-pay | Admitting: Internal Medicine

## 2012-01-30 NOTE — Assessment & Plan Note (Signed)
He stopped Vyvanse in 8/13

## 2012-02-24 ENCOUNTER — Telehealth: Payer: Self-pay | Admitting: *Deleted

## 2012-02-24 NOTE — Telephone Encounter (Signed)
Ok to ref #30 Thx

## 2012-02-24 NOTE — Telephone Encounter (Signed)
Pt calling requesting Rf on oxycodone. Please advise.

## 2012-02-25 MED ORDER — OXYCODONE-ACETAMINOPHEN 10-325 MG PO TABS: 1.0000 | ORAL_TABLET | Freq: Three times a day (TID) | ORAL | Status: DC

## 2012-02-25 NOTE — Telephone Encounter (Signed)
Rx printed./pending MD sig. Pt informed to p/u Rx tom after 8 am.

## 2012-02-27 ENCOUNTER — Other Ambulatory Visit: Payer: Self-pay | Admitting: *Deleted

## 2012-02-27 MED ORDER — INSULIN GLARGINE 100 UNIT/ML ~~LOC~~ SOLN: 40.0000 [IU] | Freq: Once | SUBCUTANEOUS | Status: DC

## 2012-03-10 ENCOUNTER — Other Ambulatory Visit: Payer: Self-pay | Admitting: Internal Medicine

## 2012-03-26 ENCOUNTER — Other Ambulatory Visit: Payer: Self-pay | Admitting: *Deleted

## 2012-03-26 MED ORDER — OXYCODONE-ACETAMINOPHEN 10-325 MG PO TABS: 1.0000 | ORAL_TABLET | Freq: Three times a day (TID) | ORAL | Status: AC

## 2012-03-26 NOTE — Telephone Encounter (Signed)
Pt requesting refill of Percocet 10-325mg  1 tablet TID-last written 02/25/2012 #30 with 0 refills-Please advise. (Also requesting samples of Lantus Solostar)

## 2012-03-26 NOTE — Telephone Encounter (Signed)
OK to fill this prescription with additional refills x0 Thank you!  

## 2012-03-26 NOTE — Telephone Encounter (Signed)
Rx printed, awaiting MD's signature.  

## 2012-03-27 ENCOUNTER — Other Ambulatory Visit: Payer: Self-pay | Admitting: *Deleted

## 2012-03-27 MED ORDER — INSULIN GLARGINE 100 UNIT/ML ~~LOC~~ SOLN: 40.0000 [IU] | Freq: Once | SUBCUTANEOUS | Status: DC

## 2012-03-27 NOTE — Telephone Encounter (Signed)
Pt informed rx ready for pickup (placed upfront in cabinet). Samples of Lantus insulin also ready for pickup (in Side B large fridge).

## 2012-04-28 ENCOUNTER — Telehealth: Payer: Self-pay | Admitting: *Deleted

## 2012-04-28 NOTE — Telephone Encounter (Signed)
He is on two antidepressants: wellbutrin and celexa already. OV iv problems Thx

## 2012-04-28 NOTE — Telephone Encounter (Signed)
Pt informed of MD's advisement. Pt states that he will not have insurance until next month to come in for OV. He was wondering if his dosages could be changed.(Pt informed that it was best that he come in for OV next month but that note would be sent to MD to ask).

## 2012-04-28 NOTE — Telephone Encounter (Signed)
Pt wants to discuss being placed on a depression medication, does he need to come in for OV?

## 2012-04-29 NOTE — Telephone Encounter (Signed)
He can take Celexa 20 mg/d Thx

## 2012-04-29 NOTE — Telephone Encounter (Signed)
Pt informed of MD's advisement regarding Celexa.

## 2012-05-18 ENCOUNTER — Other Ambulatory Visit: Payer: Self-pay | Admitting: Internal Medicine

## 2012-06-04 ENCOUNTER — Other Ambulatory Visit: Payer: Self-pay | Admitting: Internal Medicine

## 2012-06-04 ENCOUNTER — Telehealth: Payer: Self-pay | Admitting: *Deleted

## 2012-06-04 NOTE — Telephone Encounter (Signed)
Left msg on triage stating md told him to increase his citalopram to 20 mg. He currently taking 2 of the 10 mg, almost out of the 10 mg. Wanting to get rx for 20 mg sent to [facility].../lmb

## 2012-06-04 NOTE — Telephone Encounter (Signed)
Notified pt new rx for citalopram was already sent to [facility]...lmb

## 2012-07-01 ENCOUNTER — Encounter: Payer: Self-pay | Admitting: Internal Medicine

## 2012-07-01 ENCOUNTER — Ambulatory Visit (INDEPENDENT_AMBULATORY_CARE_PROVIDER_SITE_OTHER): Payer: BC Managed Care – PPO | Admitting: Internal Medicine

## 2012-07-01 VITALS — BP 122/80 | HR 80 | Temp 98.5°F | Resp 16 | Wt 265.0 lb

## 2012-07-01 DIAGNOSIS — Z23 Encounter for immunization: Secondary | ICD-10-CM

## 2012-07-01 DIAGNOSIS — G4733 Obstructive sleep apnea (adult) (pediatric): Secondary | ICD-10-CM

## 2012-07-01 DIAGNOSIS — F329 Major depressive disorder, single episode, unspecified: Secondary | ICD-10-CM

## 2012-07-01 DIAGNOSIS — I10 Essential (primary) hypertension: Secondary | ICD-10-CM

## 2012-07-01 DIAGNOSIS — E119 Type 2 diabetes mellitus without complications: Secondary | ICD-10-CM

## 2012-07-01 DIAGNOSIS — E669 Obesity, unspecified: Secondary | ICD-10-CM

## 2012-07-01 DIAGNOSIS — J45909 Unspecified asthma, uncomplicated: Secondary | ICD-10-CM

## 2012-07-01 DIAGNOSIS — J069 Acute upper respiratory infection, unspecified: Secondary | ICD-10-CM

## 2012-07-01 MED ORDER — ALBUTEROL SULFATE HFA 108 (90 BASE) MCG/ACT IN AERS: 2.0000 | INHALATION_SPRAY | Freq: Four times a day (QID) | RESPIRATORY_TRACT | Status: DC | PRN

## 2012-07-01 MED ORDER — AMOXICILLIN 500 MG PO CAPS: 1000.0000 mg | ORAL_CAPSULE | Freq: Two times a day (BID) | ORAL | Status: DC

## 2012-07-01 NOTE — Assessment & Plan Note (Signed)
Better  

## 2012-07-01 NOTE — Progress Notes (Signed)
Patient ID: Victor Byrd, male   DOB: [birth year], 47 y.o.   MRN: [id number]   Subjective:    Patient ID: Victor Byrd, male    DOB: 10-02-1965, 47 y.o.   MRN: [id number]  Cough This is a new problem. The current episode started in the past 7 days. The problem has been waxing and waning. The problem occurs constantly. Associated symptoms include chest pain. Pertinent negatives include no rash or sore throat. The symptoms are aggravated by cold air. He has tried OTC cough suppressant for the symptoms. The treatment provided mild relief.  Chest Pain  This is a chronic problem. The current episode started more than 1 month ago. The onset quality is undetermined. The problem occurs intermittently. The problem has been waxing and waning. The pain is at a severity of 7/10. The pain is severe. The quality of the pain is described as sharp. The pain radiates to the left arm. Associated symptoms include back pain and a cough. Pertinent negatives include no dizziness, nausea, palpitations or weakness. The treatment provided mild relief.      F/u DM, HTN, obesity  BP Readings from Last 3 Encounters:  07/01/12 122/80  01/29/12 178/80  10/02/11 138/84   Wt Readings from Last 3 Encounters:  07/01/12 265 lb (120.203 kg)  01/29/12 281 lb (127.461 kg)  10/02/11 286 lb (129.729 kg)       Review of Systems  Constitutional: Negative for appetite change, fatigue and unexpected weight change.  HENT: Positive for neck pain. Negative for nosebleeds, congestion, sore throat, sneezing and trouble swallowing.   Eyes: Negative for itching and visual disturbance.  Respiratory: Positive for cough.   Cardiovascular: Positive for chest pain. Negative for palpitations and leg swelling.  Gastrointestinal: Negative for nausea, diarrhea, blood in stool and abdominal distention.  Genitourinary: Negative for frequency and hematuria.  Musculoskeletal: Positive for back pain. Negative for joint swelling and gait  problem.  Skin: Negative for rash.  Neurological: Negative for dizziness, tremors, speech difficulty and weakness.  Psychiatric/Behavioral: Negative for suicidal ideas, sleep disturbance, dysphoric mood and agitation. The patient is not nervous/anxious and is not hyperactive.        Objective:   Physical Exam  Constitutional: He is oriented to person, place, and time. He appears well-developed. No distress.       obese  HENT:  Mouth/Throat: Oropharynx is clear and moist.       eryth throat  Eyes: Conjunctivae normal are normal. Pupils are equal, round, and reactive to light.  Neck: Normal range of motion. No JVD present. No thyromegaly present.  Cardiovascular: Normal rate, regular rhythm, normal heart sounds and intact distal pulses.  Exam reveals no gallop and no friction rub.   No murmur heard.      L posterior chest wall in the rhomboid muscles area  Pulmonary/Chest: Effort normal and breath sounds normal. No respiratory distress. He has no wheezes. He has no rales. He exhibits no tenderness.  Abdominal: Soft. Bowel sounds are normal. He exhibits no distension and no mass. There is no tenderness. There is no rebound and no guarding.  Musculoskeletal: Normal range of motion. He exhibits no edema and no tenderness.  Lymphadenopathy:    He has no cervical adenopathy.  Neurological: He is alert and oriented to person, place, and time. He has normal reflexes. No cranial nerve deficit. He exhibits normal muscle tone. Coordination normal.  Skin: Skin is warm and dry. No rash noted. He is not diaphoretic.  Psychiatric: He  has a normal mood and affect. His behavior is normal. Judgment and thought content normal.    Lab Results  Component Value Date   WBC 9.9 11/02/2010   HGB 14.1 11/02/2010   HCT 40.8 11/02/2010   PLT 282.0 11/02/2010   GLUCOSE 129* 04/05/2011   CHOL 106 04/05/2011   TRIG 100 04/05/2011   HDL 43 04/05/2011   LDLCALC 43 04/05/2011   ALT 40 04/05/2011   AST 25 04/05/2011   NA 139  04/05/2011   K 4.3 04/05/2011   CL 102 04/05/2011   CREATININE 0.82 04/05/2011   BUN 15 04/05/2011   CO2 28 04/05/2011   TSH 1.964 04/05/2011   HGBA1C 7.4* 04/05/2011         Assessment & Plan:

## 2012-07-01 NOTE — Assessment & Plan Note (Signed)
Continue with current prescription therapy as reflected on the Med list.  

## 2012-07-01 NOTE — Assessment & Plan Note (Signed)
Risks associated with treatment noncompliance were discussed. Compliance was encouraged. Wt Readings from Last 3 Encounters:  07/01/12 265 lb (120.203 kg)  01/29/12 281 lb (127.461 kg)  10/02/11 286 lb (129.729 kg)

## 2012-07-01 NOTE — Assessment & Plan Note (Signed)
Continue with current CPAP  

## 2012-07-01 NOTE — Assessment & Plan Note (Signed)
Amoxicillin x10d 

## 2012-07-04 ENCOUNTER — Other Ambulatory Visit: Payer: Self-pay | Admitting: Internal Medicine

## 2012-09-04 IMAGING — RF DG UGI W/ KUB
15 series · 15 of 15 positions shown · non-contrast
Comparison: none

CLINICAL DATA: Morbid obesity.  Pre-op evaluation for bariatric
surgery.

UPPER GI SERIES WITH KUB
TECHNIQUE: After obtaining a scout radiograph a single-column
upper GI series was performed using thin barium.
Fluoroscopy time: 1.8 minutes

[Series 1: run · 1 of 1 slices shown (1 of 15)]
[im 1/1]
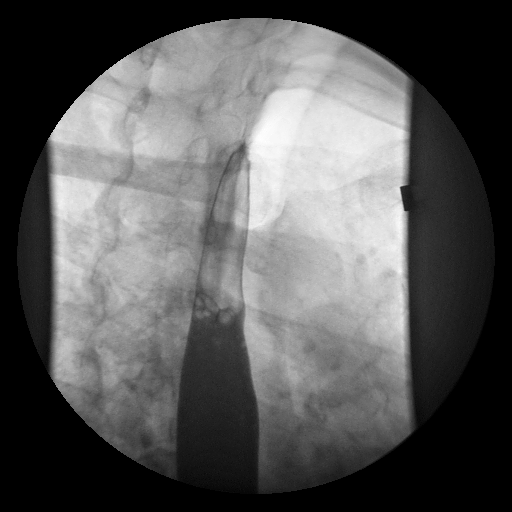

[Series 2: run · 1 of 1 slices shown (2 of 15)]
[im 1/1]
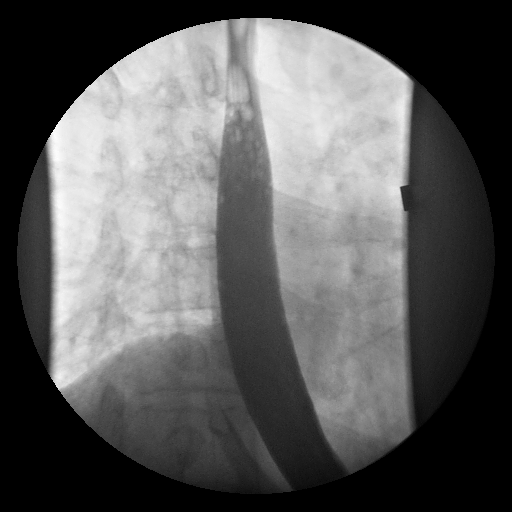

[Series 3: run · 1 of 1 slices shown (3 of 15)]
[im 1/1]
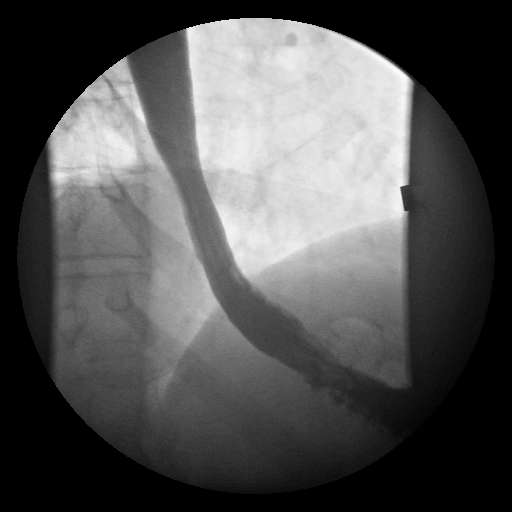

[Series 4: run · 1 of 1 slices shown (4 of 15)]
[im 1/1]
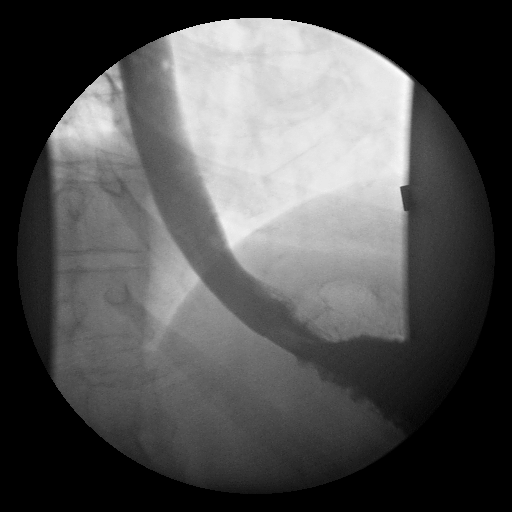

[Series 5: run · 1 of 1 slices shown (5 of 15)]
[im 1/1]
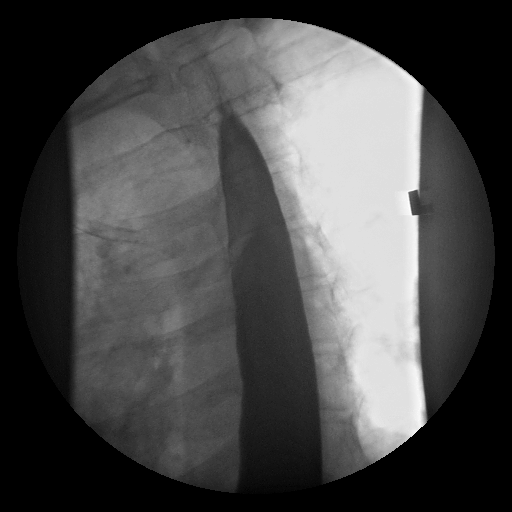

[Series 6: run · 1 of 1 slices shown (6 of 15)]
[im 1/1]
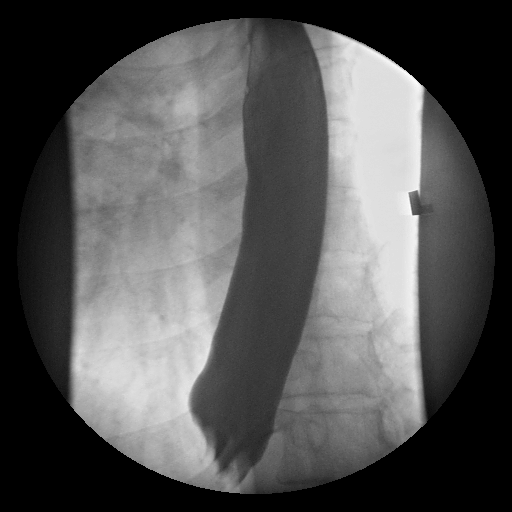

[Series 7: run · 1 of 1 slices shown (7 of 15)]
[im 1/1]
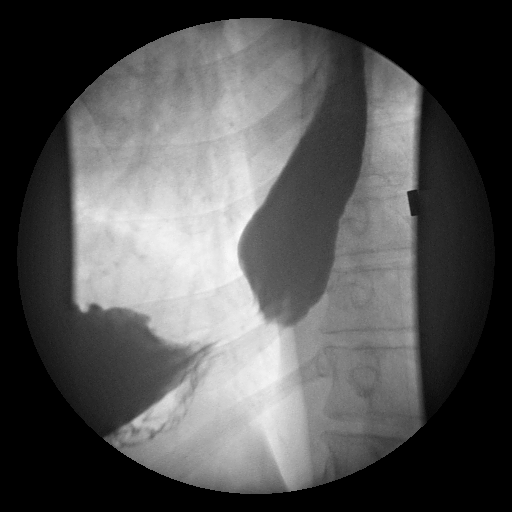

[Series 8: run · 1 of 1 slices shown (8 of 15)]
[im 1/1]
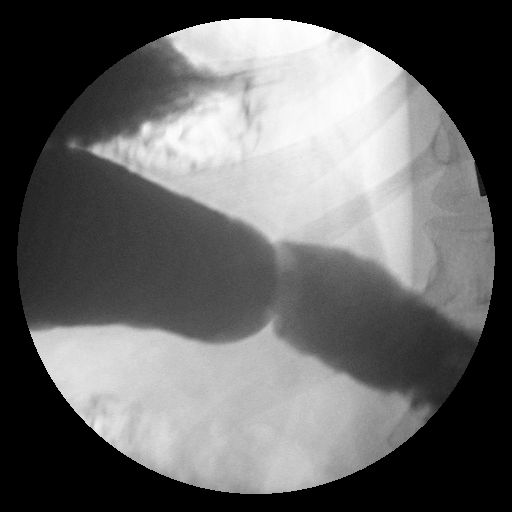

[Series 9: run · 1 of 1 slices shown (9 of 15)]
[im 1/1]
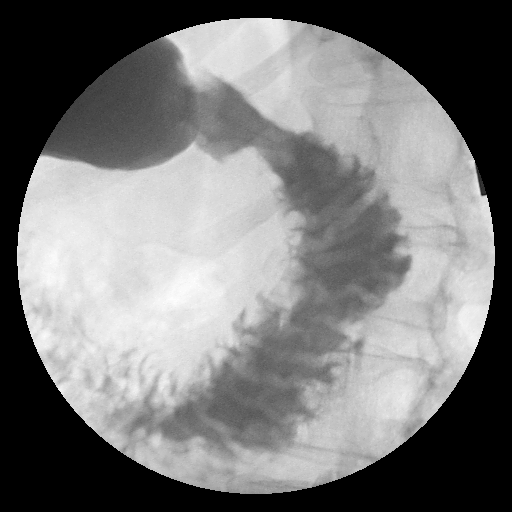

[Series 10: run · 1 of 1 slices shown (10 of 15)]
[im 1/1]
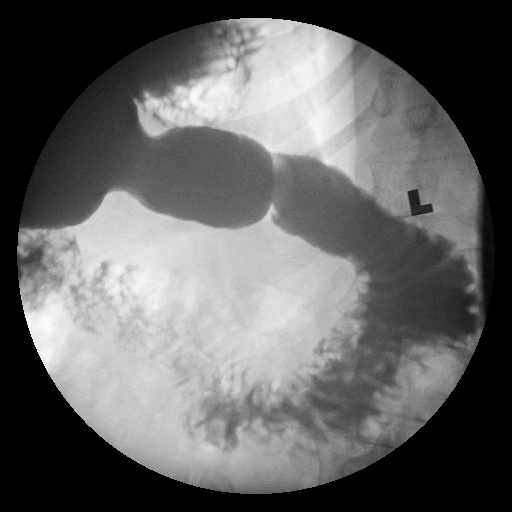

[Series 11: run · 1 of 1 slices shown (11 of 15)]
[im 1/1]
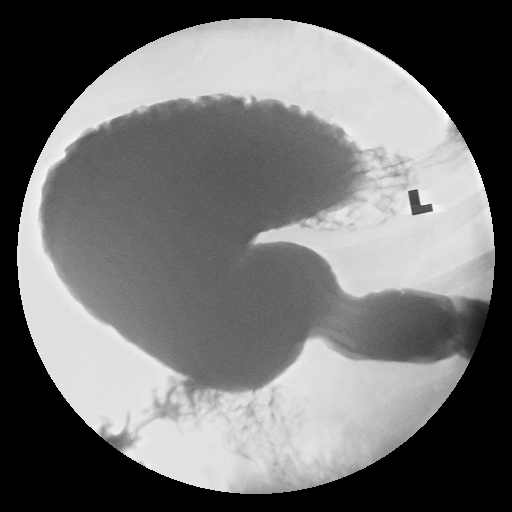

[Series 12: run · 1 of 1 slices shown (12 of 15)]
[im 1/1]
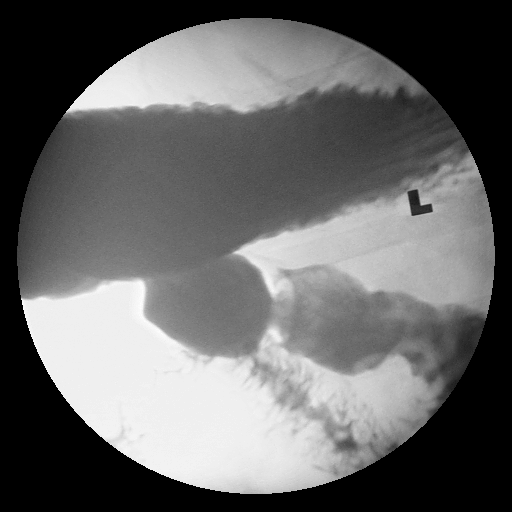

[Series 13: run · 1 of 1 slices shown (13 of 15)]
[im 1/1]
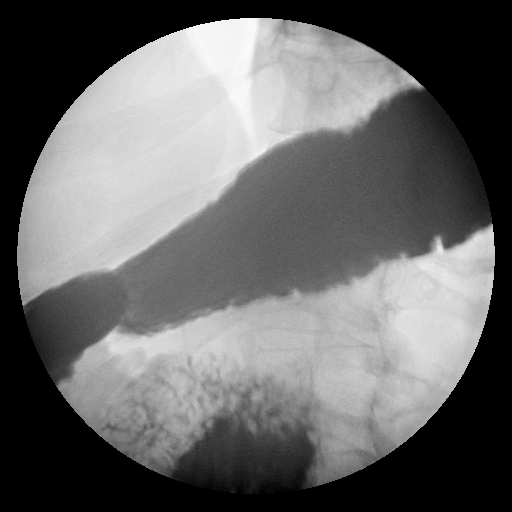

[Series 14: run · 1 of 1 slices shown (14 of 15)]
[im 1/1]
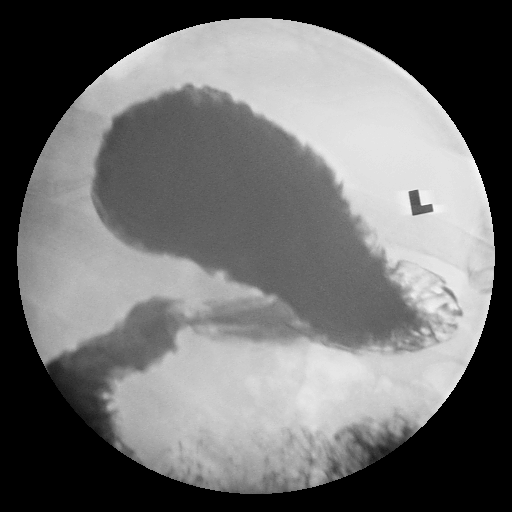

[Series 15: run · 1 of 1 slices shown (15 of 15)]
[im 1/1]
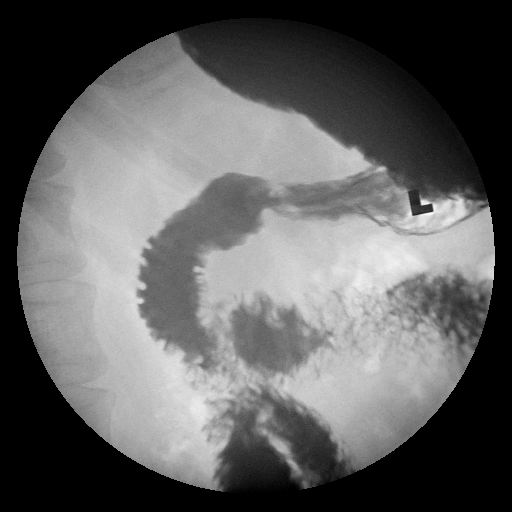

[15 of 15 positions shown; findings below may reference images not displayed]

FINDINGS: The scout radiograph shows a normal bowel gas pattern.

There is no evidence of esophageal mass or stricture.  There is no
evidence of hiatal hernia, and no gastroesophageal reflux was seen
during the exam.  Esophageal motility is within normal limits.

The stomach is normal in appearance.  There is no evidence of
gastric masses or ulcers.  Duodenal bulb and sweep are normal in
appearance.
IMPRESSION: Negative UGI series.

## 2012-12-21 ENCOUNTER — Other Ambulatory Visit: Payer: Self-pay | Admitting: *Deleted

## 2012-12-21 MED ORDER — CITALOPRAM HYDROBROMIDE 10 MG PO TABS: 20.0000 mg | ORAL_TABLET | Freq: Every day | ORAL | Status: DC

## 2013-01-04 ENCOUNTER — Other Ambulatory Visit: Payer: Self-pay | Admitting: *Deleted

## 2013-01-04 ENCOUNTER — Other Ambulatory Visit: Payer: Self-pay | Admitting: Internal Medicine

## 2013-01-04 MED ORDER — AMLODIPINE BESYLATE 10 MG PO TABS: 10.0000 mg | ORAL_TABLET | Freq: Every day | ORAL | Status: DC

## 2013-01-11 ENCOUNTER — Other Ambulatory Visit: Payer: Self-pay | Admitting: Internal Medicine

## 2013-01-21 ENCOUNTER — Telehealth: Payer: Self-pay | Admitting: Internal Medicine

## 2013-01-21 NOTE — Telephone Encounter (Signed)
Patient is at [facility] to purchase CPAP supplies.  His insurance has changed and he needs a RX faxed there.  Fax # [phone]

## 2013-01-22 NOTE — Telephone Encounter (Signed)
OK. Thx

## 2013-03-22 ENCOUNTER — Other Ambulatory Visit: Payer: Self-pay | Admitting: Internal Medicine

## 2013-04-05 ENCOUNTER — Other Ambulatory Visit: Payer: Self-pay | Admitting: Internal Medicine

## 2013-04-06 ENCOUNTER — Telehealth: Payer: Self-pay | Admitting: *Deleted

## 2013-04-06 NOTE — Telephone Encounter (Signed)
OK  Monitor BP, pulse Thx

## 2013-04-06 NOTE — Telephone Encounter (Signed)
Pt called requesting whether it is OK to start Garcinia Cambogia Deitary Supplement.  Please advise

## 2013-04-07 NOTE — Telephone Encounter (Signed)
Spoke with pt advised of MDs message 

## 2013-05-10 ENCOUNTER — Other Ambulatory Visit: Payer: Self-pay | Admitting: Internal Medicine

## 2013-05-10 NOTE — Telephone Encounter (Signed)
Refill done.  

## 2013-06-22 ENCOUNTER — Other Ambulatory Visit: Payer: Self-pay | Admitting: Internal Medicine

## 2013-06-24 ENCOUNTER — Encounter: Payer: Self-pay | Admitting: Internal Medicine

## 2013-06-24 ENCOUNTER — Ambulatory Visit (INDEPENDENT_AMBULATORY_CARE_PROVIDER_SITE_OTHER): Payer: BC Managed Care – PPO | Admitting: Internal Medicine

## 2013-06-24 VITALS — BP 140/80 | HR 80 | Temp 98.4°F | Resp 16 | Ht 72.0 in | Wt 281.0 lb

## 2013-06-24 DIAGNOSIS — F3289 Other specified depressive episodes: Secondary | ICD-10-CM

## 2013-06-24 DIAGNOSIS — Z23 Encounter for immunization: Secondary | ICD-10-CM

## 2013-06-24 DIAGNOSIS — E119 Type 2 diabetes mellitus without complications: Secondary | ICD-10-CM

## 2013-06-24 DIAGNOSIS — I10 Essential (primary) hypertension: Secondary | ICD-10-CM

## 2013-06-24 DIAGNOSIS — E0590 Thyrotoxicosis, unspecified without thyrotoxic crisis or storm: Secondary | ICD-10-CM

## 2013-06-24 DIAGNOSIS — F329 Major depressive disorder, single episode, unspecified: Secondary | ICD-10-CM

## 2013-06-24 DIAGNOSIS — Z0000 Encounter for general adult medical examination without abnormal findings: Secondary | ICD-10-CM

## 2013-06-24 DIAGNOSIS — J45909 Unspecified asthma, uncomplicated: Secondary | ICD-10-CM

## 2013-06-24 NOTE — Assessment & Plan Note (Signed)
Continue with current prescription therapy as reflected on the Med list. labs 

## 2013-06-24 NOTE — Patient Instructions (Signed)
Loose weight    Milk free trial (no milk, ice cream, cheese and yogurt) for 4-6 weeks. OK to use almond, coconut, rice milk. "Almond breeze" brand tastes good.

## 2013-06-24 NOTE — Assessment & Plan Note (Signed)
Continue with current prescription therapy as reflected on the Med list.  

## 2013-06-24 NOTE — Assessment & Plan Note (Signed)
Labs

## 2013-06-24 NOTE — Progress Notes (Signed)
Pre visit review using our clinic review tool, if applicable. No additional management support is needed unless otherwise documented below in the visit note. 

## 2013-06-24 NOTE — Progress Notes (Signed)
   Subjective:    HPI  The patient is here for a wellness exam. The patient has been doing well overall without major physical or psychological issues going on lately. The patient needs to address  chronic hypertension that has been well controlled with medicines; to address chronic  hyperlipidemia controlled with medicines as well; and to address type 2 chronic diabetes, controlled with medical treatment and diet.   F/u DM, HTN, obesity  BP Readings from Last 3 Encounters:  06/24/13 140/80  07/01/12 122/80  01/29/12 178/80   Wt Readings from Last 3 Encounters:  06/24/13 281 lb (127.461 kg)  07/01/12 265 lb (120.203 kg)  01/29/12 281 lb (127.461 kg)       Review of Systems  Constitutional: Negative for appetite change, fatigue and unexpected weight change.  HENT: Negative for congestion, nosebleeds, sneezing and trouble swallowing.   Eyes: Negative for itching and visual disturbance.  Cardiovascular: Negative for leg swelling.  Gastrointestinal: Negative for diarrhea, blood in stool and abdominal distention.  Genitourinary: Negative for frequency and hematuria.  Musculoskeletal: Positive for neck pain. Negative for gait problem and joint swelling.  Neurological: Negative for tremors and speech difficulty.  Psychiatric/Behavioral: Negative for suicidal ideas, sleep disturbance, dysphoric mood and agitation. The patient is not nervous/anxious and is not hyperactive.        Objective:   Physical Exam  Constitutional: He is oriented to person, place, and time. He appears well-developed. No distress.  obese  HENT:  Mouth/Throat: Oropharynx is clear and moist.  eryth throat  Eyes: Conjunctivae are normal. Pupils are equal, round, and reactive to light.  Neck: Normal range of motion. No JVD present. No thyromegaly present.  Cardiovascular: Normal rate, regular rhythm, normal heart sounds and intact distal pulses.  Exam reveals no gallop and no friction rub.   No murmur  heard. L posterior chest wall in the rhomboid muscles area  Pulmonary/Chest: Effort normal and breath sounds normal. No respiratory distress. He has no wheezes. He has no rales. He exhibits no tenderness.  Abdominal: Soft. Bowel sounds are normal. He exhibits no distension and no mass. There is no tenderness. There is no rebound and no guarding.  Musculoskeletal: Normal range of motion. He exhibits no edema and no tenderness.  Lymphadenopathy:    He has no cervical adenopathy.  Neurological: He is alert and oriented to person, place, and time. He has normal reflexes. No cranial nerve deficit. He exhibits normal muscle tone. Coordination normal.  Skin: Skin is warm and dry. No rash noted. He is not diaphoretic.  Psychiatric: He has a normal mood and affect. His behavior is normal. Judgment and thought content normal.    Lab Results  Component Value Date   WBC 9.9 11/02/2010   HGB 14.1 11/02/2010   HCT 40.8 11/02/2010   PLT 282.0 11/02/2010   GLUCOSE 129* 04/05/2011   CHOL 106 04/05/2011   TRIG 100 04/05/2011   HDL 43 04/05/2011   LDLCALC 43 04/05/2011   ALT 40 04/05/2011   AST 25 04/05/2011   NA 139 04/05/2011   K 4.3 04/05/2011   CL 102 04/05/2011   CREATININE 0.82 04/05/2011   BUN 15 04/05/2011   CO2 28 04/05/2011   TSH 1.964 04/05/2011   HGBA1C 7.4* 04/05/2011         Assessment & Plan:

## 2013-06-24 NOTE — Assessment & Plan Note (Signed)
We discussed age appropriate health related issues, including available/recomended screening tests and vaccinations. We discussed a need for adhering to healthy diet and exercise. Labs/EKG were reviewed/ordered. All questions were answered.   

## 2013-06-25 ENCOUNTER — Telehealth: Payer: Self-pay

## 2013-06-25 NOTE — Telephone Encounter (Signed)
Relevant patient education assigned to patient using Emmi. ° °

## 2013-06-28 ENCOUNTER — Other Ambulatory Visit: Payer: Self-pay | Admitting: Internal Medicine

## 2013-07-05 ENCOUNTER — Other Ambulatory Visit: Payer: Self-pay | Admitting: Internal Medicine

## 2013-07-21 ENCOUNTER — Other Ambulatory Visit: Payer: Self-pay | Admitting: Internal Medicine

## 2013-08-30 ENCOUNTER — Encounter: Payer: Self-pay | Admitting: Internal Medicine

## 2013-08-30 ENCOUNTER — Ambulatory Visit (INDEPENDENT_AMBULATORY_CARE_PROVIDER_SITE_OTHER): Payer: BC Managed Care – PPO | Admitting: Internal Medicine

## 2013-08-30 VITALS — BP 160/80 | HR 80 | Temp 98.6°F | Resp 16 | Wt 286.0 lb

## 2013-08-30 DIAGNOSIS — I10 Essential (primary) hypertension: Secondary | ICD-10-CM

## 2013-08-30 DIAGNOSIS — F329 Major depressive disorder, single episode, unspecified: Secondary | ICD-10-CM

## 2013-08-30 DIAGNOSIS — G4700 Insomnia, unspecified: Secondary | ICD-10-CM

## 2013-08-30 DIAGNOSIS — E119 Type 2 diabetes mellitus without complications: Secondary | ICD-10-CM

## 2013-08-30 DIAGNOSIS — E669 Obesity, unspecified: Secondary | ICD-10-CM

## 2013-08-30 DIAGNOSIS — F3289 Other specified depressive episodes: Secondary | ICD-10-CM

## 2013-08-30 MED ORDER — BUPROPION HCL ER (XL) 150 MG PO TB24: 150.0000 mg | ORAL_TABLET | Freq: Every day | ORAL | Status: DC

## 2013-08-30 MED ORDER — NORTRIPTYLINE HCL 25 MG PO CAPS
25.0000 mg | ORAL_CAPSULE | Freq: Every day | ORAL | Status: DC
Start: 2013-08-30 — End: 2014-06-01

## 2013-08-30 NOTE — Assessment & Plan Note (Signed)
Continue with current prescription therapy as reflected on the Med list.  

## 2013-08-30 NOTE — Assessment & Plan Note (Signed)
Worse  Discussed  

## 2013-08-30 NOTE — Assessment & Plan Note (Signed)
Change Wellbutrin XL Start Nortript at hs

## 2013-08-30 NOTE — Progress Notes (Signed)
Pre visit review using our clinic review tool, if applicable. No additional management support is needed unless otherwise documented below in the visit note. 

## 2013-08-30 NOTE — Progress Notes (Signed)
  Subjective:    HPI  C/o insomnia - worse, can't sleep  The patient needs to address  chronic hypertension that has been well controlled with medicines; to address chronic  hyperlipidemia controlled with medicines as well; and to address type 2 chronic diabetes, controlled with medical treatment and diet.   F/u DM, HTN, obesity  BP Readings from Last 3 Encounters:  08/30/13 160/80  06/24/13 140/80  07/01/12 122/80   Wt Readings from Last 3 Encounters:  08/30/13 286 lb (129.729 kg)  06/24/13 281 lb (127.461 kg)  07/01/12 265 lb (120.203 kg)       Review of Systems  Constitutional: Negative for appetite change, fatigue and unexpected weight change.  HENT: Negative for congestion, nosebleeds, sneezing and trouble swallowing.   Eyes: Negative for itching and visual disturbance.  Cardiovascular: Negative for leg swelling.  Gastrointestinal: Negative for diarrhea, blood in stool and abdominal distention.  Genitourinary: Negative for frequency and hematuria.  Musculoskeletal: Positive for neck pain. Negative for gait problem and joint swelling.  Neurological: Negative for tremors and speech difficulty.  Psychiatric/Behavioral: Negative for suicidal ideas, sleep disturbance, dysphoric mood and agitation. The patient is not nervous/anxious and is not hyperactive.        Objective:   Physical Exam  Constitutional: He is oriented to person, place, and time. He appears well-developed. No distress.  obese  HENT:  Mouth/Throat: Oropharynx is clear and moist.  eryth throat  Eyes: Conjunctivae are normal. Pupils are equal, round, and reactive to light.  Neck: Normal range of motion. No JVD present. No thyromegaly present.  Cardiovascular: Normal rate, regular rhythm, normal heart sounds and intact distal pulses.  Exam reveals no gallop and no friction rub.   No murmur heard. L posterior chest wall in the rhomboid muscles area  Pulmonary/Chest: Effort normal and breath sounds  normal. No respiratory distress. He has no wheezes. He has no rales. He exhibits no tenderness.  Abdominal: Soft. Bowel sounds are normal. He exhibits no distension and no mass. There is no tenderness. There is no rebound and no guarding.  Musculoskeletal: Normal range of motion. He exhibits no edema and no tenderness.  Lymphadenopathy:    He has no cervical adenopathy.  Neurological: He is alert and oriented to person, place, and time. He has normal reflexes. No cranial nerve deficit. He exhibits normal muscle tone. Coordination normal.  Skin: Skin is warm and dry. No rash noted. He is not diaphoretic.  Psychiatric: He has a normal mood and affect. His behavior is normal. Judgment and thought content normal.    Lab Results  Component Value Date   WBC 9.9 11/02/2010   HGB 14.1 11/02/2010   HCT 40.8 11/02/2010   PLT 282.0 11/02/2010   GLUCOSE 129* 04/05/2011   CHOL 106 04/05/2011   TRIG 100 04/05/2011   HDL 43 04/05/2011   LDLCALC 43 04/05/2011   ALT 40 04/05/2011   AST 25 04/05/2011   NA 139 04/05/2011   K 4.3 04/05/2011   CL 102 04/05/2011   CREATININE 0.82 04/05/2011   BUN 15 04/05/2011   CO2 28 04/05/2011   TSH 1.964 04/05/2011   HGBA1C 7.4* 04/05/2011         Assessment & Plan:

## 2013-08-31 ENCOUNTER — Telehealth: Payer: Self-pay | Admitting: Internal Medicine

## 2013-08-31 NOTE — Telephone Encounter (Signed)
Relevant patient education assigned to patient using Emmi. ° °

## 2013-09-09 ENCOUNTER — Other Ambulatory Visit: Payer: Self-pay | Admitting: Internal Medicine

## 2013-09-28 ENCOUNTER — Ambulatory Visit (INDEPENDENT_AMBULATORY_CARE_PROVIDER_SITE_OTHER): Payer: BC Managed Care – PPO | Admitting: Internal Medicine

## 2013-09-28 ENCOUNTER — Encounter: Payer: Self-pay | Admitting: Internal Medicine

## 2013-09-28 VITALS — BP 142/76 | HR 100 | Temp 98.5°F | Wt 286.0 lb

## 2013-09-28 DIAGNOSIS — J029 Acute pharyngitis, unspecified: Secondary | ICD-10-CM

## 2013-09-28 MED ORDER — AZITHROMYCIN 250 MG PO TABS: ORAL_TABLET | ORAL | Status: DC

## 2013-09-28 NOTE — Progress Notes (Signed)
Patient ID: Victor Byrd, male   DOB: 1966/03/02, 48 y.o.   MRN: [id number]  Subjective:    Sore Throat  This is a new problem. The current episode started 1 to 4 weeks ago (3 wks). The problem has been unchanged. There has been no fever. The pain is moderate. Pertinent negatives include no congestion, diarrhea, neck pain or trouble swallowing. The treatment provided no relief.      The patient needs to address  chronic hypertension that has been well controlled with medicines; to address chronic  hyperlipidemia controlled with medicines as well; and to address type 2 chronic diabetes, controlled with medical treatment and diet.   F/u DM, HTN, obesity  BP Readings from Last 3 Encounters:  09/28/13 142/76  08/30/13 160/80  06/24/13 140/80   Wt Readings from Last 3 Encounters:  09/28/13 286 lb (129.729 kg)  08/30/13 286 lb (129.729 kg)  06/24/13 281 lb (127.461 kg)       Review of Systems  Constitutional: Negative for appetite change, fatigue and unexpected weight change.  HENT: Negative for congestion, nosebleeds, sneezing and trouble swallowing.   Eyes: Negative for itching and visual disturbance.  Cardiovascular: Negative for leg swelling.  Gastrointestinal: Negative for diarrhea, blood in stool and abdominal distention.  Genitourinary: Negative for frequency and hematuria.  Musculoskeletal: Negative for gait problem, joint swelling and neck pain.  Neurological: Negative for tremors and speech difficulty.  Psychiatric/Behavioral: Negative for suicidal ideas, sleep disturbance, dysphoric mood and agitation. The patient is not nervous/anxious and is not hyperactive.        Objective:   Physical Exam  Constitutional: He is oriented to person, place, and time. He appears well-developed. No distress.  obese  HENT:  Mouth/Throat: Oropharynx is clear and moist.  eryth throat  Eyes: Conjunctivae are normal. Pupils are equal, round, and reactive to light.  Neck: Normal  range of motion. No JVD present. No thyromegaly present.  Cardiovascular: Normal rate, regular rhythm, normal heart sounds and intact distal pulses.  Exam reveals no gallop and no friction rub.   No murmur heard. L posterior chest wall in the rhomboid muscles area  Pulmonary/Chest: Effort normal and breath sounds normal. No respiratory distress. He has no wheezes. He has no rales. He exhibits no tenderness.  Abdominal: Soft. Bowel sounds are normal. He exhibits no distension and no mass. There is no tenderness. There is no rebound and no guarding.  Musculoskeletal: Normal range of motion. He exhibits no edema and no tenderness.  Lymphadenopathy:    He has no cervical adenopathy.  Neurological: He is alert and oriented to person, place, and time. He has normal reflexes. No cranial nerve deficit. He exhibits normal muscle tone. Coordination normal.  Skin: Skin is warm and dry. No rash noted. He is not diaphoretic.  Psychiatric: He has a normal mood and affect. His behavior is normal. Judgment and thought content normal.    Lab Results  Component Value Date   WBC 9.9 11/02/2010   HGB 14.1 11/02/2010   HCT 40.8 11/02/2010   PLT 282.0 11/02/2010   GLUCOSE 129* 04/05/2011   CHOL 106 04/05/2011   TRIG 100 04/05/2011   HDL 43 04/05/2011   LDLCALC 43 04/05/2011   ALT 40 04/05/2011   AST 25 04/05/2011   NA 139 04/05/2011   K 4.3 04/05/2011   CL 102 04/05/2011   CREATININE 0.82 04/05/2011   BUN 15 04/05/2011   CO2 28 04/05/2011   TSH 1.964 04/05/2011   HGBA1C 7.4*  04/05/2011     Strep (-)    Assessment & Plan:

## 2013-09-28 NOTE — Progress Notes (Signed)
Pre visit review using our clinic review tool, if applicable. No additional management support is needed unless otherwise documented below in the visit note. 

## 2013-09-28 NOTE — Assessment & Plan Note (Signed)
4/15 Strep (-) x3 wks Empiric Zpac

## 2013-10-01 ENCOUNTER — Other Ambulatory Visit: Payer: Self-pay | Admitting: Internal Medicine

## 2013-10-20 ENCOUNTER — Other Ambulatory Visit: Payer: Self-pay | Admitting: Internal Medicine

## 2013-12-01 ENCOUNTER — Telehealth: Payer: Self-pay | Admitting: *Deleted

## 2013-12-01 DIAGNOSIS — E119 Type 2 diabetes mellitus without complications: Secondary | ICD-10-CM

## 2013-12-01 NOTE — Telephone Encounter (Signed)
Patient will come in fasting for next office visit.  A1c, lipid, bmet ordered.

## 2013-12-10 ENCOUNTER — Encounter: Payer: Self-pay | Admitting: *Deleted

## 2013-12-20 ENCOUNTER — Encounter: Payer: Self-pay | Admitting: Internal Medicine

## 2013-12-20 ENCOUNTER — Ambulatory Visit: Payer: BC Managed Care – PPO

## 2013-12-20 ENCOUNTER — Ambulatory Visit (INDEPENDENT_AMBULATORY_CARE_PROVIDER_SITE_OTHER): Payer: BC Managed Care – PPO | Admitting: Internal Medicine

## 2013-12-20 VITALS — BP 140/80 | HR 91 | Temp 98.7°F | Ht 72.0 in | Wt 287.0 lb

## 2013-12-20 DIAGNOSIS — I10 Essential (primary) hypertension: Secondary | ICD-10-CM

## 2013-12-20 DIAGNOSIS — E119 Type 2 diabetes mellitus without complications: Secondary | ICD-10-CM

## 2013-12-20 DIAGNOSIS — J45909 Unspecified asthma, uncomplicated: Secondary | ICD-10-CM

## 2013-12-20 DIAGNOSIS — F909 Attention-deficit hyperactivity disorder, unspecified type: Secondary | ICD-10-CM

## 2013-12-20 DIAGNOSIS — E0590 Thyrotoxicosis, unspecified without thyrotoxic crisis or storm: Secondary | ICD-10-CM

## 2013-12-20 DIAGNOSIS — F3289 Other specified depressive episodes: Secondary | ICD-10-CM

## 2013-12-20 DIAGNOSIS — F329 Major depressive disorder, single episode, unspecified: Secondary | ICD-10-CM

## 2013-12-20 LAB — BASIC METABOLIC PANEL
BUN: 14 mg/dL (ref 6–23)
CO2: 28 mEq/L (ref 19–32)
Calcium: 8.6 mg/dL (ref 8.4–10.5)
Chloride: 102 mEq/L (ref 96–112)
Creatinine, Ser: 0.7 mg/dL (ref 0.4–1.5)
GFR: 120.77 mL/min (ref >60.00)
Glucose, Bld: 105 mg/dL — ABNORMAL HIGH (ref 70–99)
POTASSIUM: 3.7 meq/L (ref 3.5–5.1)
Sodium: 137 mEq/L (ref 135–145)

## 2013-12-20 LAB — HEMOGLOBIN A1C: HEMOGLOBIN A1C: 8.6 % — AB (ref 4.6–6.5)

## 2013-12-20 LAB — LIPID PANEL
Cholesterol: 111 mg/dL (ref 0–200)
HDL: 36.5 mg/dL — ABNORMAL LOW (ref >39.00)
LDL Cholesterol: 56 mg/dL (ref 0–99)
NonHDL: 74.5
Total CHOL/HDL Ratio: 3
Triglycerides: 93 mg/dL (ref 0.0–149.0)
VLDL: 18.6 mg/dL (ref 0.0–40.0)

## 2013-12-20 NOTE — Assessment & Plan Note (Signed)
Continue with current prescription therapy as reflected on the Med list.  

## 2013-12-20 NOTE — Progress Notes (Signed)
Pre visit review using our clinic review tool, if applicable. No additional management support is needed unless otherwise documented below in the visit note. 

## 2013-12-20 NOTE — Assessment & Plan Note (Signed)
Not on rx 

## 2013-12-20 NOTE — Assessment & Plan Note (Addendum)
Continue with current prescription therapy as reflected on the Med list. He can see a psychologist. His dtr is depressed

## 2013-12-20 NOTE — Progress Notes (Signed)
   Subjective:    HPI    The patient needs to address  chronic hypertension that has been well controlled with medicines; to address chronic  hyperlipidemia controlled with medicines as well; and to address type 2 chronic diabetes, controlled with medical treatment and diet.   F/u DM, HTN, obesity  BP Readings from Last 3 Encounters:  12/20/13 140/80  09/28/13 142/76  08/30/13 160/80   Wt Readings from Last 3 Encounters:  12/20/13 287 lb (130.182 kg)  09/28/13 286 lb (129.729 kg)  08/30/13 286 lb (129.729 kg)       Review of Systems  Constitutional: Negative for appetite change, fatigue and unexpected weight change.  HENT: Negative for nosebleeds and sneezing.   Eyes: Negative for itching and visual disturbance.  Cardiovascular: Negative for leg swelling.  Gastrointestinal: Negative for blood in stool and abdominal distention.  Genitourinary: Negative for frequency and hematuria.  Musculoskeletal: Negative for gait problem and joint swelling.  Neurological: Negative for tremors and speech difficulty.  Psychiatric/Behavioral: Negative for suicidal ideas, sleep disturbance, dysphoric mood and agitation. The patient is not nervous/anxious and is not hyperactive.        Objective:   Physical Exam  Constitutional: He is oriented to person, place, and time. He appears well-developed. No distress.  obese  HENT:  Mouth/Throat: Oropharynx is clear and moist.  eryth throat  Eyes: Conjunctivae are normal. Pupils are equal, round, and reactive to light.  Neck: Normal range of motion. No JVD present. No thyromegaly present.  Cardiovascular: Normal rate, regular rhythm, normal heart sounds and intact distal pulses.  Exam reveals no gallop and no friction rub.   No murmur heard. L posterior chest wall in the rhomboid muscles area  Pulmonary/Chest: Effort normal and breath sounds normal. No respiratory distress. He has no wheezes. He has no rales. He exhibits no tenderness.   Abdominal: Soft. Bowel sounds are normal. He exhibits no distension and no mass. There is no tenderness. There is no rebound and no guarding.  Musculoskeletal: Normal range of motion. He exhibits no edema and no tenderness.  Lymphadenopathy:    He has no cervical adenopathy.  Neurological: He is alert and oriented to person, place, and time. He has normal reflexes. No cranial nerve deficit. He exhibits normal muscle tone. Coordination normal.  Skin: Skin is warm and dry. No rash noted. He is not diaphoretic.  Psychiatric: He has a normal mood and affect. His behavior is normal. Judgment and thought content normal.    Lab Results  Component Value Date   WBC 9.9 11/02/2010   HGB 14.1 11/02/2010   HCT 40.8 11/02/2010   PLT 282.0 11/02/2010   GLUCOSE 129* 04/05/2011   CHOL 106 04/05/2011   TRIG 100 04/05/2011   HDL 43 04/05/2011   LDLCALC 43 04/05/2011   ALT 40 04/05/2011   AST 25 04/05/2011   NA 139 04/05/2011   K 4.3 04/05/2011   CL 102 04/05/2011   CREATININE 0.82 04/05/2011   BUN 15 04/05/2011   CO2 28 04/05/2011   TSH 1.964 04/05/2011   HGBA1C 7.4* 04/05/2011       Assessment & Plan:

## 2014-01-31 ENCOUNTER — Other Ambulatory Visit: Payer: Self-pay | Admitting: Internal Medicine

## 2014-02-02 ENCOUNTER — Other Ambulatory Visit: Payer: Self-pay | Admitting: Internal Medicine

## 2014-03-28 ENCOUNTER — Other Ambulatory Visit: Payer: Self-pay | Admitting: Internal Medicine

## 2014-06-01 ENCOUNTER — Ambulatory Visit (INDEPENDENT_AMBULATORY_CARE_PROVIDER_SITE_OTHER): Payer: 59 | Admitting: Internal Medicine

## 2014-06-01 ENCOUNTER — Encounter: Payer: Self-pay | Admitting: Internal Medicine

## 2014-06-01 VITALS — BP 170/80 | HR 92 | Temp 98.2°F | Resp 12 | Ht 73.0 in | Wt 286.0 lb

## 2014-06-01 DIAGNOSIS — S46019A Strain of muscle(s) and tendon(s) of the rotator cuff of unspecified shoulder, initial encounter: Secondary | ICD-10-CM | POA: Insufficient documentation

## 2014-06-01 DIAGNOSIS — F331 Major depressive disorder, recurrent, moderate: Secondary | ICD-10-CM

## 2014-06-01 DIAGNOSIS — E669 Obesity, unspecified: Secondary | ICD-10-CM

## 2014-06-01 DIAGNOSIS — E1140 Type 2 diabetes mellitus with diabetic neuropathy, unspecified: Secondary | ICD-10-CM

## 2014-06-01 DIAGNOSIS — E1165 Type 2 diabetes mellitus with hyperglycemia: Secondary | ICD-10-CM

## 2014-06-01 DIAGNOSIS — G4700 Insomnia, unspecified: Secondary | ICD-10-CM

## 2014-06-01 DIAGNOSIS — J4520 Mild intermittent asthma, uncomplicated: Secondary | ICD-10-CM

## 2014-06-01 DIAGNOSIS — S46011A Strain of muscle(s) and tendon(s) of the rotator cuff of right shoulder, initial encounter: Secondary | ICD-10-CM

## 2014-06-01 DIAGNOSIS — IMO0002 Reserved for concepts with insufficient information to code with codable children: Secondary | ICD-10-CM

## 2014-06-01 DIAGNOSIS — I10 Essential (primary) hypertension: Secondary | ICD-10-CM

## 2014-06-01 MED ORDER — LOSARTAN POTASSIUM 100 MG PO TABS: 100.0000 mg | ORAL_TABLET | Freq: Every day | ORAL | Status: DC

## 2014-06-01 MED ORDER — BUPROPION HCL ER (XL) 150 MG PO TB24: ORAL_TABLET | ORAL | Status: DC

## 2014-06-01 MED ORDER — ATORVASTATIN CALCIUM 40 MG PO TABS
40.0000 mg | ORAL_TABLET | Freq: Every day | ORAL | Status: DC
Start: 2014-06-01 — End: 2015-07-24

## 2014-06-01 MED ORDER — NORTRIPTYLINE HCL 25 MG PO CAPS: 25.0000 mg | ORAL_CAPSULE | Freq: Every day | ORAL | Status: DC

## 2014-06-01 MED ORDER — METFORMIN HCL 1000 MG PO TABS: 1000.0000 mg | ORAL_TABLET | Freq: Two times a day (BID) | ORAL | Status: DC

## 2014-06-01 MED ORDER — AMLODIPINE BESYLATE 10 MG PO TABS: 10.0000 mg | ORAL_TABLET | Freq: Every day | ORAL | Status: DC

## 2014-06-01 MED ORDER — ALBUTEROL SULFATE HFA 108 (90 BASE) MCG/ACT IN AERS: INHALATION_SPRAY | RESPIRATORY_TRACT | Status: DC

## 2014-06-01 MED ORDER — CITALOPRAM HYDROBROMIDE 10 MG PO TABS: 20.0000 mg | ORAL_TABLET | Freq: Every day | ORAL | Status: DC

## 2014-06-01 NOTE — Assessment & Plan Note (Signed)
Changed to Losartan NAS diet, wt loss

## 2014-06-01 NOTE — Assessment & Plan Note (Signed)
ROM exercises Ortho ref was offered

## 2014-06-01 NOTE — Assessment & Plan Note (Signed)
Low carb diet 

## 2014-06-01 NOTE — Assessment & Plan Note (Addendum)
Continue with current prescription therapy as reflected on the Med list. Re-started Nortriptyline

## 2014-06-01 NOTE — Assessment & Plan Note (Addendum)
Continue with current prescription therapy as reflected on the Med list. D/c ACE due to cough

## 2014-06-01 NOTE — Assessment & Plan Note (Signed)
Re-started Nortriptyline

## 2014-06-01 NOTE — Assessment & Plan Note (Signed)
Dr Chalmers Cater - has an appt next week

## 2014-06-01 NOTE — Progress Notes (Signed)
   Subjective:    HPI   C/o depression, tearful The patient needs to address  chronic hypertension that has been well controlled with medicines; to address chronic  hyperlipidemia controlled with medicines as well; and to address type 2 chronic diabetes, controlled with medical treatment and diet.   F/u DM, HTN, obesity  BP Readings from Last 3 Encounters:  06/01/14 170/80  12/20/13 140/80  09/28/13 142/76   Wt Readings from Last 3 Encounters:  06/01/14 286 lb (129.729 kg)  12/20/13 287 lb (130.182 kg)  09/28/13 286 lb (129.729 kg)       Review of Systems  Constitutional: Negative for appetite change, fatigue and unexpected weight change.  HENT: Negative for nosebleeds and sneezing.   Eyes: Negative for itching and visual disturbance.  Cardiovascular: Negative for leg swelling.  Gastrointestinal: Negative for blood in stool and abdominal distention.  Genitourinary: Negative for frequency and hematuria.  Musculoskeletal: Negative for joint swelling and gait problem.  Neurological: Negative for tremors and speech difficulty.  Psychiatric/Behavioral: Negative for suicidal ideas, sleep disturbance, dysphoric mood and agitation. The patient is not nervous/anxious and is not hyperactive.        Objective:   Physical Exam  Constitutional: He is oriented to person, place, and time. He appears well-developed. No distress.  NAD  HENT:  Mouth/Throat: Oropharynx is clear and moist.  Eyes: Conjunctivae are normal. Pupils are equal, round, and reactive to light.  Neck: Normal range of motion. No JVD present. No thyromegaly present.  Cardiovascular: Normal rate, regular rhythm, normal heart sounds and intact distal pulses.  Exam reveals no gallop and no friction rub.   No murmur heard. Pulmonary/Chest: Effort normal and breath sounds normal. No respiratory distress. He has no wheezes. He has no rales. He exhibits no tenderness.  Abdominal: Soft. Bowel sounds are normal. He  exhibits no distension and no mass. There is no tenderness. There is no rebound and no guarding.  Musculoskeletal: Normal range of motion. He exhibits no edema or tenderness.  Lymphadenopathy:    He has no cervical adenopathy.  Neurological: He is alert and oriented to person, place, and time. He has normal reflexes. No cranial nerve deficit. He exhibits normal muscle tone. He displays a negative Romberg sign. Coordination and gait normal.  No meningeal signs  Skin: Skin is warm and dry. No rash noted.  Psychiatric: He has a normal mood and affect. His behavior is normal. Judgment and thought content normal.  Obese R shoulder is tender w/ROM  Lab Results  Component Value Date   WBC 9.9 11/02/2010   HGB 14.1 11/02/2010   HCT 40.8 11/02/2010   PLT 282.0 11/02/2010   GLUCOSE 105* 12/20/2013   CHOL 111 12/20/2013   TRIG 93.0 12/20/2013   HDL 36.50* 12/20/2013   LDLCALC 56 12/20/2013   ALT 40 04/05/2011   AST 25 04/05/2011   NA 137 12/20/2013   K 3.7 12/20/2013   CL 102 12/20/2013   CREATININE 0.7 12/20/2013   BUN 14 12/20/2013   CO2 28 12/20/2013   TSH 1.964 04/05/2011   HGBA1C 8.6* 12/20/2013       Assessment & Plan:

## 2014-06-01 NOTE — Patient Instructions (Addendum)
Low carb diet Shoulder stretch

## 2014-06-23 ENCOUNTER — Encounter: Payer: Self-pay | Admitting: Cardiology

## 2014-07-04 ENCOUNTER — Other Ambulatory Visit: Payer: Self-pay | Admitting: Internal Medicine

## 2014-08-04 ENCOUNTER — Ambulatory Visit (INDEPENDENT_AMBULATORY_CARE_PROVIDER_SITE_OTHER): Payer: 59 | Admitting: Internal Medicine

## 2014-08-04 ENCOUNTER — Encounter: Payer: Self-pay | Admitting: Internal Medicine

## 2014-08-04 VITALS — BP 138/78 | HR 93 | Temp 98.3°F | Ht 71.0 in | Wt 289.0 lb

## 2014-08-04 DIAGNOSIS — H9313 Tinnitus, bilateral: Secondary | ICD-10-CM | POA: Insufficient documentation

## 2014-08-04 NOTE — Progress Notes (Signed)
Pre visit review using our clinic review tool, if applicable. No additional management support is needed unless otherwise documented below in the visit note. 

## 2014-08-04 NOTE — Patient Instructions (Signed)
Go to WebMD for information about tinnitus. Avoid excess aspirin and excess noise exposure. "White noise" machine @ bedside can prevent tinnitus from affecting sleep. If indicated by repeat  ENT referral will be completed.

## 2014-08-04 NOTE — Progress Notes (Signed)
   Subjective:    Patient ID: Victor Byrd, male    DOB: 04-19-1966, 49 y.o.   MRN: [id number]  HPI  He describes increasing tinnitus bilaterally over the last 4 weeks or more. There was no trigger such as barotrauma, diving, excessive noise exposure, excessive aspirin ingestion , or upper respiratory tract infection.    He had seen  An audiologist in  May 2015 for decreased hearing as noted by his wife. That report was reviewed; it was essentially normal. He was given a copy of that report.    He does have some dizziness when he stands up from squatting.   He does have polydipsia; he sees an endocrinologist for his diabetes.  He has no other sensory loss  Review of Systems    The tinnitus has not been associated with hearing loss or vertigo. He denies blurred vision, double vision, loss of vision. There's been no loss of sense of smell or taste.    He has no polyuria or polyphagia.  Frontal headache, facial pain , nasal purulence, dental pain, sore throat , otic pain or otic discharge denied. No fever , chills or sweats.    Objective:   Physical Exam   General appearance:BMI 40.33.Adequately nourished; no acute distress or increased work of breathing is present.  No  lymphadenopathy about the head, neck, or axilla noted.   Eyes: No conjunctival inflammation or lid edema is present. There is no scleral icterus. Extraocular motion is intact. He has no nystagmus. Field of vision is normal.  Ears:  External ear exam shows no significant lesions or deformities.  Otoscopic examination reveals clear canals, tympanic membranes are intact bilaterally without bulging, retraction, inflammation or discharge. Whispers or 6 feet. Tuning fork exam was normal  Nose:  External nasal examination shows no deformity or inflammation. Nasal mucosa are pink and moist without lesions or exudates. No septal dislocation or deviation.No obstruction to airflow.   Oral exam: Dental hygiene is good; lips and  gums are healthy appearing.There is no oropharyngeal erythema or exudate noted.   Neck:  No deformities, thyromegaly, masses, or tenderness noted.   Supple with full range of motion without pain.   Heart:  Normal rate and regular rhythm. S1 and S2 normal without gallop, murmur, click, rub or other extra sounds.   Lungs:Chest clear to auscultation; no wheezes, rhonchi,rales ,or rubs present.  Extremities:  No cyanosis, edema, or clubbing  noted   Abdomen protuberant.  Skin: Warm & dry w/o jaundice or tenting.   Gait normal. Romberg and finger-nose testing negative.      Assessment & Plan:   #1 tinnitus , bilateral. This is been progressive in the last 4 weeks without specific trigger.  Plan: Refer back for audiologic evaluation. If pathology is demonstrated; ENT referral.

## 2014-08-04 NOTE — Assessment & Plan Note (Signed)
Audiology reassessment   Ear protection discussed

## 2014-11-25 ENCOUNTER — Telehealth: Payer: Self-pay

## 2014-11-25 NOTE — Telephone Encounter (Signed)
Pt will come in and get HgA1c rechecked

## 2014-12-14 ENCOUNTER — Telehealth: Payer: Self-pay | Admitting: Internal Medicine

## 2014-12-14 NOTE — Telephone Encounter (Signed)
Spoke to pt. Pt has an appt tomorrow with his ophthalmologist.

## 2014-12-14 NOTE — Telephone Encounter (Signed)
Patient Name: Victor Byrd  DOB: 10-10-1965    Initial Comment Caller states he has an infection in his right eye. Pus discharging   Nurse Assessment  Nurse: Mallie Mussel, RN, Alveta Heimlich Date/Time Eilene Ghazi Time): 12/14/2014 11:57:07 AM  Confirm and document reason for call. If symptomatic, describe symptoms. ---Caller states that he has a milky discharge coming from his right eye for about the last 2 weeks. Its been off and on, but worse this morning, his eye was crusted shut. His eyelid is not swollen or red in color. He states that it feels like there is a cut or something in the corner of his eye. Denies bleeding. He rates his eye pain as 6-7 on 0-10 scale but only when it is touched. He is unsure if he has a fever.  Has the patient traveled out of the country within the last 30 days? ---No  Does the patient require triage? ---Yes  Related visit to physician within the last 2 weeks? ---No  Does the PT have any chronic conditions? (i.e. diabetes, asthma, etc.) ---Yes  List chronic conditions. ---Asthma, Diabetes, HTN, Hypercholesterolemia     Guidelines    Guideline Title Affirmed Question Affirmed Notes  Eye - Pus or Discharge MODERATE eye pain (e.g., interferes with normal activities)    Final Disposition User   See Physician within 4 Hours (or PCP triage) Mallie Mussel, RN, Alveta Heimlich    Comments  From checking about 15 minutes ago for appointments, I know that no appointments are available at either [facility] or [facility]. I called the back line and spoke with [place] who transferred me to [facility]. The caller had told me that they didn't have anything available until Friday morning and he was okay waiting until then. Tammy states that nothing is availabe until Friday. I advised caller that I still feels he needs to be seen sooner that that. Pain as 6-7 is pretty significant on eyes. I suggested he might try an optometrist or an ophthalmologist, and UC or if necessary the ER. He verbalized understanding.  His plan is to  call his stomatologist.   Referrals  [place] UNDECIDED   Disagree/Comply: Comply

## 2014-12-16 ENCOUNTER — Ambulatory Visit: Payer: 59 | Admitting: Family

## 2015-06-07 DIAGNOSIS — E7800 Pure hypercholesterolemia, unspecified: Secondary | ICD-10-CM | POA: Diagnosis not present

## 2015-06-07 DIAGNOSIS — G609 Hereditary and idiopathic neuropathy, unspecified: Secondary | ICD-10-CM | POA: Diagnosis not present

## 2015-06-07 DIAGNOSIS — E1165 Type 2 diabetes mellitus with hyperglycemia: Secondary | ICD-10-CM | POA: Diagnosis not present

## 2015-06-07 DIAGNOSIS — I10 Essential (primary) hypertension: Secondary | ICD-10-CM | POA: Diagnosis not present

## 2015-06-12 ENCOUNTER — Other Ambulatory Visit: Payer: Self-pay | Admitting: Internal Medicine

## 2015-06-12 MED FILL — NORTRIPTYLINE HCL 25 MG CAP: 25 | 90 days supply | Qty: 180 | Fill #0

## 2015-06-12 MED FILL — GLIMEPIRIDE 4 MG TABLET: 4 | 30 days supply | Qty: 60 | Fill #0

## 2015-06-16 ENCOUNTER — Other Ambulatory Visit: Payer: Self-pay | Admitting: Internal Medicine

## 2015-06-16 MED FILL — LOSARTAN-HCTZ 100-25 MG TAB: 100-25 | 30 days supply | Qty: 30 | Fill #5

## 2015-06-20 ENCOUNTER — Other Ambulatory Visit: Payer: Self-pay | Admitting: Internal Medicine

## 2015-06-20 MED FILL — LANTUS SOLOSTAR 100 UNITS/M: 100 | 90 days supply | Qty: 90 | Fill #3

## 2015-06-21 MED FILL — BUPROPION HCL XL 150 MG TAB: 150 | 90 days supply | Qty: 90 | Fill #0

## 2015-06-22 ENCOUNTER — Telehealth: Payer: Self-pay | Admitting: Internal Medicine

## 2015-06-22 MED ORDER — BUPROPION HCL ER (XL) 150 MG PO TB24: 150.0000 mg | ORAL_TABLET | Freq: Every day | ORAL | Status: DC

## 2015-06-22 MED ORDER — CITALOPRAM HYDROBROMIDE 10 MG PO TABS: 20.0000 mg | ORAL_TABLET | Freq: Every day | ORAL | Status: DC

## 2015-06-22 MED FILL — CITALOPRAM HBR 10 MG TABLET: 10 | 10 days supply | Qty: 20 | Fill #0

## 2015-06-22 NOTE — Telephone Encounter (Signed)
Patient has made an appointment for next week for CPE.  He is currently out of Wellbutrin and antidepressant.  He is requesting these two med's to be sent to [facility] long out patient.

## 2015-06-22 NOTE — Telephone Encounter (Signed)
Notified pt week supply sent to [facility]...Victor Byrd

## 2015-06-28 ENCOUNTER — Ambulatory Visit (INDEPENDENT_AMBULATORY_CARE_PROVIDER_SITE_OTHER): Payer: 59 | Admitting: Internal Medicine

## 2015-06-28 ENCOUNTER — Encounter: Payer: Self-pay | Admitting: Internal Medicine

## 2015-06-28 VITALS — BP 130/74 | HR 104 | Ht 71.0 in | Wt 297.0 lb

## 2015-06-28 DIAGNOSIS — E119 Type 2 diabetes mellitus without complications: Secondary | ICD-10-CM | POA: Diagnosis not present

## 2015-06-28 DIAGNOSIS — Z23 Encounter for immunization: Secondary | ICD-10-CM | POA: Diagnosis not present

## 2015-06-28 DIAGNOSIS — E1140 Type 2 diabetes mellitus with diabetic neuropathy, unspecified: Secondary | ICD-10-CM

## 2015-06-28 DIAGNOSIS — N522 Drug-induced erectile dysfunction: Secondary | ICD-10-CM | POA: Diagnosis not present

## 2015-06-28 DIAGNOSIS — Z0000 Encounter for general adult medical examination without abnormal findings: Secondary | ICD-10-CM

## 2015-06-28 DIAGNOSIS — N529 Male erectile dysfunction, unspecified: Secondary | ICD-10-CM | POA: Insufficient documentation

## 2015-06-28 DIAGNOSIS — E669 Obesity, unspecified: Secondary | ICD-10-CM

## 2015-06-28 DIAGNOSIS — E1165 Type 2 diabetes mellitus with hyperglycemia: Secondary | ICD-10-CM | POA: Diagnosis not present

## 2015-06-28 DIAGNOSIS — IMO0002 Reserved for concepts with insufficient information to code with codable children: Secondary | ICD-10-CM

## 2015-06-28 DIAGNOSIS — H524 Presbyopia: Secondary | ICD-10-CM | POA: Diagnosis not present

## 2015-06-28 DIAGNOSIS — F339 Major depressive disorder, recurrent, unspecified: Secondary | ICD-10-CM

## 2015-06-28 LAB — HM DIABETES EYE EXAM

## 2015-06-28 MED ORDER — SILDENAFIL CITRATE 100 MG PO TABS: 100.0000 mg | ORAL_TABLET | ORAL | Status: DC | PRN

## 2015-06-28 MED FILL — VIAGRA 100 MG TABLET: 100 | 30 days supply | Qty: 6 | Fill #0

## 2015-06-28 NOTE — Assessment & Plan Note (Signed)
On Rx 

## 2015-06-28 NOTE — Assessment & Plan Note (Signed)
Dr Chalmers Cater Chronic

## 2015-06-28 NOTE — Progress Notes (Signed)
Subjective:  Patient ID: Victor Byrd, male    DOB: 06-09-1965  Age: 50 y.o. MRN: [id number]  CC: Annual Exam   HPI [name] presents for HTN, obesity, DM. C/o ED.  Outpatient Prescriptions Prior to Visit  Medication Sig Dispense Refill  . albuterol (VENTOLIN HFA) 108 (90 BASE) MCG/ACT inhaler INHALE 2 PUFFS INTO THE LUNGS EVERY 6 HOURS AS NEEDED FOR WHEEZING. 3 Inhaler 3  . amLODipine (NORVASC) 10 MG tablet Take 1 tablet (10 mg total) by mouth daily. 90 tablet 3  . aspirin 81 MG tablet Take 81 mg by mouth daily.      Marland Kitchen atorvastatin (LIPITOR) 40 MG tablet Take 1 tablet (40 mg total) by mouth daily. 90 tablet 3  . buPROPion (WELLBUTRIN XL) 150 MG 24 hr tablet Take 1 tablet (150 mg total) by mouth daily. Must keep 06/28/15 appt for refills 10 tablet 0  . Cholecalciferol (VITAMIN D3) 1000 UNITS tablet Take 1,000 Units by mouth daily.      . citalopram (CELEXA) 10 MG tablet Take 2 tablets (20 mg total) by mouth daily. Must keep 06/28/15 appt for refills 20 tablet 0  . metFORMIN (GLUCOPHAGE) 1000 MG tablet Take 1 tablet (1,000 mg total) by mouth 2 (two) times daily with a meal. 180 tablet 3  . nortriptyline (PAMELOR) 25 MG capsule TAKE 1 TO 2 CAPSULES BY MOUTH AT BEDTIME 180 capsule 3  . insulin glargine (LANTUS SOLOSTAR) 100 UNIT/ML injection Inject 40 Units into the skin once. Taking every morning 1 pen 0  . losartan (COZAAR) 100 MG tablet Take 1 tablet (100 mg total) by mouth daily. 90 tablet 3  . albuterol (PROVENTIL,VENTOLIN) 90 MCG/ACT inhaler Inhale 2 puffs into the lungs every 6 (six) hours as needed.     No facility-administered medications prior to visit.    ROS Review of Systems  Constitutional: Positive for fatigue. Negative for appetite change and unexpected weight change.  HENT: Negative for congestion, nosebleeds, sneezing, sore throat and trouble swallowing.   Eyes: Negative for itching and visual disturbance.  Respiratory: Negative for cough.   Cardiovascular:  Negative for chest pain, palpitations and leg swelling.  Gastrointestinal: Negative for nausea, diarrhea, blood in stool and abdominal distention.  Genitourinary: Negative for urgency, frequency and hematuria.  Musculoskeletal: Negative for back pain, joint swelling, gait problem and neck pain.  Skin: Negative for rash.  Neurological: Negative for dizziness, tremors, speech difficulty and weakness.  Psychiatric/Behavioral: Negative for sleep disturbance, dysphoric mood and agitation. The patient is nervous/anxious.     Objective:  BP 130/74 mmHg  Pulse 104  Ht 5\' 11"  (1.803 m)  Wt 297 lb (134.718 kg)  BMI 41.44 kg/m2  SpO2 94%  BP Readings from Last 3 Encounters:  06/28/15 130/74  08/04/14 138/78  06/01/14 170/80    Wt Readings from Last 3 Encounters:  06/28/15 297 lb (134.718 kg)  08/04/14 289 lb (131.09 kg)  06/01/14 286 lb (129.729 kg)    Physical Exam  Constitutional: He is oriented to person, place, and time. He appears well-developed. No distress.  NAD  HENT:  Mouth/Throat: Oropharynx is clear and moist.  Eyes: Conjunctivae are normal. Pupils are equal, round, and reactive to light.  Neck: Normal range of motion. No JVD present. No thyromegaly present.  Cardiovascular: Normal rate, regular rhythm, normal heart sounds and intact distal pulses.  Exam reveals no gallop and no friction rub.   No murmur heard. Pulmonary/Chest: Effort normal and breath sounds normal. No respiratory distress.  He has no wheezes. He has no rales. He exhibits no tenderness.  Abdominal: Soft. Bowel sounds are normal. He exhibits no distension and no mass. There is no tenderness. There is no rebound and no guarding.  Musculoskeletal: Normal range of motion. He exhibits no edema or tenderness.  Lymphadenopathy:    He has no cervical adenopathy.  Neurological: He is alert and oriented to person, place, and time. He has normal reflexes. No cranial nerve deficit. He exhibits normal muscle tone. He  displays a negative Romberg sign. Coordination and gait normal.  Skin: Skin is warm and dry. No rash noted.  Psychiatric: He has a normal mood and affect. His behavior is normal. Judgment and thought content normal.    Lab Results  Component Value Date   WBC 10.9* 06/29/2015   HGB 14.4 06/29/2015   HCT 43.5 06/29/2015   PLT 280.0 06/29/2015   GLUCOSE 194* 06/29/2015   CHOL 127 06/29/2015   TRIG 114.0 06/29/2015   HDL 44.60 06/29/2015   LDLCALC 59 06/29/2015   ALT 48 06/29/2015   AST 28 06/29/2015   NA 140 06/29/2015   K 4.2 06/29/2015   CL 101 06/29/2015   CREATININE 0.79 06/29/2015   BUN 17 06/29/2015   CO2 29 06/29/2015   TSH 2.68 06/29/2015   PSA 0.69 06/29/2015   HGBA1C 8.6* 12/20/2013    Dg Ugi W/kub  03/20/2011  *RADIOLOGY REPORT* Clinical Data:  Morbid obesity.  Pre-op evaluation for bariatric surgery. UPPER GI SERIES WITH KUB Technique:  After obtaining a scout radiograph a single-column upper GI series was performed using thin barium. Fluoroscopy time: 1.8 minutes Findings:  The scout radiograph shows a normal bowel gas pattern. There is no evidence of esophageal mass or stricture.  There is no evidence of hiatal hernia, and no gastroesophageal reflux was seen during the exam.  Esophageal motility is within normal limits. The stomach is normal in appearance.  There is no evidence of gastric masses or ulcers.  Duodenal bulb and sweep are normal in appearance. IMPRESSION: Negative UGI series. Original Report Authenticated By: Marlaine Hind, M.D.   Assessment & Plan:   [place] was seen today for annual exam.  Diagnoses and all orders for this visit:  Well adult exam -     Testosterone; Future -     TSH; Future -     Urinalysis; Future -     PSA; Future -     Basic metabolic panel; Future -     CBC with Differential/Platelet; Future -     Hepatic function panel; Future -     Lipid panel; Future  DM type 2, uncontrolled, with neuropathy  (HCC)  Obesity  Drug-induced erectile dysfunction -     Testosterone; Future  Episode of recurrent major depressive disorder, unspecified depression episode severity ([place])  Need for influenza vaccination -     Flu Vaccine QUAD 36+ mos IM  Other orders -     sildenafil (VIAGRA) 100 MG tablet; Take 1 tablet (100 mg total) by mouth as needed for erectile dysfunction.  I have discontinued Mr. Rossy insulin glargine and losartan. I am also having him start on sildenafil. Additionally, I am having him maintain his aspirin, cholecalciferol, albuterol, amLODipine, atorvastatin, metFORMIN, albuterol, nortriptyline, buPROPion, citalopram, glimepiride, LANTUS SOLOSTAR, and losartan-hydrochlorothiazide.  Meds ordered this encounter  Medications  . glimepiride (AMARYL) 4 MG tablet    Sig: Take 1 tablet by mouth 2 (two) times daily.    Refill:  6  .  LANTUS SOLOSTAR 100 UNIT/ML Solostar Pen    Sig: 70 units every morning, 50 units every evening    Refill:  3  . losartan-hydrochlorothiazide (HYZAAR) 100-25 MG tablet    Sig: Take 1 tablet by mouth daily.    Refill:  6  . sildenafil (VIAGRA) 100 MG tablet    Sig: Take 1 tablet (100 mg total) by mouth as needed for erectile dysfunction.    Dispense:  12 tablet    Refill:  11     Follow-up: Return in about 6 months (around 12/26/2015) for a follow-up visit.  Walker Kehr, MD

## 2015-06-28 NOTE — Progress Notes (Signed)
Pre visit review using our clinic review tool, if applicable. No additional management support is needed unless otherwise documented below in the visit note. 

## 2015-06-28 NOTE — Assessment & Plan Note (Signed)
Chronic, morbid

## 2015-06-29 ENCOUNTER — Other Ambulatory Visit (INDEPENDENT_AMBULATORY_CARE_PROVIDER_SITE_OTHER): Payer: 59

## 2015-06-29 DIAGNOSIS — Z0000 Encounter for general adult medical examination without abnormal findings: Secondary | ICD-10-CM | POA: Diagnosis not present

## 2015-06-29 DIAGNOSIS — N522 Drug-induced erectile dysfunction: Secondary | ICD-10-CM | POA: Diagnosis not present

## 2015-06-29 LAB — CBC WITH DIFFERENTIAL/PLATELET
Basophils Absolute: 0 10*3/uL (ref 0.0–0.1)
Basophils Relative: 0.4 % (ref 0.0–3.0)
EOS PCT: 3 % (ref 0.0–5.0)
Eosinophils Absolute: 0.3 10*3/uL (ref 0.0–0.7)
HCT: 43.5 % (ref 39.0–52.0)
HEMOGLOBIN: 14.4 g/dL (ref 13.0–17.0)
LYMPHS ABS: 2.7 10*3/uL (ref 0.7–4.0)
Lymphocytes Relative: 24.9 % (ref 12.0–46.0)
MCHC: 33 g/dL (ref 30.0–36.0)
MCV: 80.7 fl (ref 78.0–100.0)
MONOS PCT: 9.7 % (ref 3.0–12.0)
Monocytes Absolute: 1.1 10*3/uL — ABNORMAL HIGH (ref 0.1–1.0)
NEUTROS PCT: 62 % (ref 43.0–77.0)
Neutro Abs: 6.8 10*3/uL (ref 1.4–7.7)
Platelets: 280 10*3/uL (ref 150.0–400.0)
RBC: 5.4 Mil/uL (ref 4.22–5.81)
RDW: 14.1 % (ref 11.5–15.5)
WBC: 10.9 10*3/uL — ABNORMAL HIGH (ref 4.0–10.5)

## 2015-06-29 LAB — HEPATIC FUNCTION PANEL
ALBUMIN: 4.3 g/dL (ref 3.5–5.2)
ALT: 48 U/L (ref 0–53)
AST: 28 U/L (ref 0–37)
Alkaline Phosphatase: 59 U/L (ref 39–117)
Bilirubin, Direct: 0.1 mg/dL (ref 0.0–0.3)
TOTAL PROTEIN: 7.3 g/dL (ref 6.0–8.3)
Total Bilirubin: 0.6 mg/dL (ref 0.2–1.2)

## 2015-06-29 LAB — TSH: TSH: 2.68 u[IU]/mL (ref 0.35–4.50)

## 2015-06-29 LAB — BASIC METABOLIC PANEL
BUN: 17 mg/dL (ref 6–23)
CO2: 29 mEq/L (ref 19–32)
Calcium: 9.4 mg/dL (ref 8.4–10.5)
Chloride: 101 mEq/L (ref 96–112)
Creatinine, Ser: 0.79 mg/dL (ref 0.40–1.50)
GFR: 111.26 mL/min (ref >60.00)
GLUCOSE: 194 mg/dL — AB (ref 70–99)
POTASSIUM: 4.2 meq/L (ref 3.5–5.1)
SODIUM: 140 meq/L (ref 135–145)

## 2015-06-29 LAB — TESTOSTERONE: Testosterone: 235.17 ng/dL — ABNORMAL LOW (ref 300.00–890.00)

## 2015-06-29 LAB — URINALYSIS
BILIRUBIN URINE: NEGATIVE
Hgb urine dipstick: NEGATIVE
Ketones, ur: NEGATIVE
LEUKOCYTES UA: NEGATIVE
NITRITE: NEGATIVE
PH: 6.5 (ref 5.0–8.0)
SPECIFIC GRAVITY, URINE: 1.02 (ref 1.000–1.030)
Urine Glucose: >=1000 — AB
Urobilinogen, UA: 0.2 (ref 0.0–1.0)

## 2015-06-29 LAB — LIPID PANEL
CHOLESTEROL: 127 mg/dL (ref 0–200)
HDL: 44.6 mg/dL (ref >39.00)
LDL Cholesterol: 59 mg/dL (ref 0–99)
NonHDL: 82.04
Total CHOL/HDL Ratio: 3
Triglycerides: 114 mg/dL (ref 0.0–149.0)
VLDL: 22.8 mg/dL (ref 0.0–40.0)

## 2015-06-29 LAB — PSA: PSA: 0.69 ng/mL (ref 0.10–4.00)

## 2015-07-06 ENCOUNTER — Other Ambulatory Visit: Payer: Self-pay | Admitting: Internal Medicine

## 2015-07-06 MED FILL — CITALOPRAM HBR 10 MG TABLET: 10 | 90 days supply | Qty: 180 | Fill #0

## 2015-07-20 MED FILL — ONETOUCH VERIO TEST STRIP: 30 days supply | Qty: 100 | Fill #2

## 2015-07-22 MED FILL — METFORMIN HCL ER 500 MG TAB: 500 | 90 days supply | Qty: 360 | Fill #1

## 2015-07-22 MED FILL — LOSARTAN-HCTZ 100-25 MG TAB: 100-25 | 30 days supply | Qty: 30 | Fill #6

## 2015-07-24 ENCOUNTER — Other Ambulatory Visit: Payer: Self-pay | Admitting: Internal Medicine

## 2015-07-24 MED FILL — ATORVASTATIN 40 MG TABLET: 40 | 90 days supply | Qty: 90 | Fill #0

## 2015-08-06 MED FILL — GLIMEPIRIDE 4 MG TABLET: 4 | 30 days supply | Qty: 60 | Fill #1

## 2015-08-07 ENCOUNTER — Other Ambulatory Visit: Payer: Self-pay | Admitting: Internal Medicine

## 2015-08-07 MED FILL — AMLODIPINE BESYLATE 10 MG T: 10 | 90 days supply | Qty: 90 | Fill #0

## 2015-08-10 MED FILL — VIAGRA 100 MG TABLET: 100 | 30 days supply | Qty: 6 | Fill #1

## 2015-08-14 ENCOUNTER — Ambulatory Visit (INDEPENDENT_AMBULATORY_CARE_PROVIDER_SITE_OTHER): Payer: 59 | Admitting: Internal Medicine

## 2015-08-14 ENCOUNTER — Encounter: Payer: Self-pay | Admitting: Internal Medicine

## 2015-08-14 VITALS — BP 168/78 | HR 100 | Temp 99.1°F | Resp 18 | Wt 300.0 lb

## 2015-08-14 DIAGNOSIS — K130 Diseases of lips: Secondary | ICD-10-CM

## 2015-08-14 NOTE — Patient Instructions (Signed)
If your lip lesion does not go away you should see an ENT.

## 2015-08-14 NOTE — Progress Notes (Signed)
Subjective:    Patient ID: Victor Byrd, male    DOB: [birth year], 50 y.o.   MRN: [id number]  HPI He is here for an acute visit for a bump on his lip.   He noticed it about one month. He denies tauma.  No pain or discomfort.  It has not gotten bigger.  It looked like it was about to go away - faded and then it came back.  He has not put anything on it. She does lick his lips a lot. He denies prior episodes. He does have a history of cold sores.  He denies oral lesions.    Hypertension:  He did not take his meds today.  He does typically his blood pressure is controlled.  Medications and allergies reviewed with patient and updated if appropriate.  Patient Active Problem List   Diagnosis Date Noted  . Erectile dysfunction 06/28/2015  . Tinnitus of both ears 08/04/2014  . Rotator cuff strain 06/01/2014  . Acute pharyngitis 09/28/2013  . Insomnia 08/30/2013  . Chest wall pain 10/02/2011  . Costochondritis 10/02/2011  . URI, acute 07/17/2011  . Atypical chest pain 06/12/2011  . Chest pain, atypical 05/15/2011  . UTI (urinary tract infection) 11/25/2010  . Well adult exam 11/05/2010  . Laceration of index finger 09/11/2010  . NECK PAIN, ACUTE 07/16/2010  . LYMPHADENITIS 06/12/2010  . APHTHOUS ULCERS 06/12/2010  . OBSTRUCTIVE SLEEP APNEA 02/21/2010  . URINARY URGENCY 02/21/2010  . ADHD 11/09/2009  . HYPERTHYROIDISM 09/07/2009  . DM type 2, uncontrolled, with neuropathy ([place]) 09/07/2009  . Obesity 09/07/2009  . Depression, major ([place]) 09/07/2009  . Essential hypertension 09/07/2009  . Asthma 09/07/2009  . RENAL CALCULUS, HX OF 09/07/2009  . WISDOM TEETH EXTRACTION, HX OF 09/07/2009    Current Outpatient Prescriptions on File Prior to Visit  Medication Sig Dispense Refill  . albuterol (VENTOLIN HFA) 108 (90 BASE) MCG/ACT inhaler INHALE 2 PUFFS INTO THE LUNGS EVERY 6 HOURS AS NEEDED FOR WHEEZING. 3 Inhaler 3  . amLODipine (NORVASC) 10 MG tablet TAKE 1 TABLET BY MOUTH ONCE  DAILY 90 tablet 3  . aspirin 81 MG tablet Take 81 mg by mouth daily.      Marland Kitchen atorvastatin (LIPITOR) 40 MG tablet TAKE 1 TABLET BY MOUTH ONCE DAILY 90 tablet 3  . buPROPion (WELLBUTRIN XL) 150 MG 24 hr tablet Take 1 tablet (150 mg total) by mouth daily. Must keep 06/28/15 appt for refills 10 tablet 0  . Cholecalciferol (VITAMIN D3) 1000 UNITS tablet Take 1,000 Units by mouth daily.      . citalopram (CELEXA) 10 MG tablet Take 2 tablets (20 mg total) by mouth daily. Must keep 06/28/15 appt for refills 20 tablet 0  . glimepiride (AMARYL) 4 MG tablet Take 1 tablet by mouth 2 (two) times daily.  6  . LANTUS SOLOSTAR 100 UNIT/ML Solostar Pen 70 units every morning, 50 units every evening  3  . losartan-hydrochlorothiazide (HYZAAR) 100-25 MG tablet Take 1 tablet by mouth daily.  6  . metFORMIN (GLUCOPHAGE) 1000 MG tablet Take 1 tablet (1,000 mg total) by mouth 2 (two) times daily with a meal. 180 tablet 3  . nortriptyline (PAMELOR) 25 MG capsule TAKE 1 TO 2 CAPSULES BY MOUTH AT BEDTIME 180 capsule 3  . sildenafil (VIAGRA) 100 MG tablet Take 1 tablet (100 mg total) by mouth as needed for erectile dysfunction. 12 tablet 11  . albuterol (PROVENTIL,VENTOLIN) 90 MCG/ACT inhaler Inhale 2 puffs into the lungs every 6 (  six) hours as needed.     No current facility-administered medications on file prior to visit.    Past Medical History  Diagnosis Date  . Depression   . OSA on CPAP     Dr. Chalmers Cater  . Diabetes mellitus type II   . Restless legs   . Asthma   . Hypertension   . Hyperthyroidism   . Hyperlipidemia     Past Surgical History  Procedure Laterality Date  . Vasectomy  2007  . Hernia repair  [place]  . Wisdom tooth extraction  1992    Social History   Social History  . Marital Status: Married    Spouse Name: N/A  . Number of Children: 2  . Years of Education: N/A   Occupational History  . Security     out of work   Social History Main Topics  . Smoking status: Never Smoker   .  Smokeless tobacco: Not on file  . Alcohol Use: Yes  . Drug Use: No  . Sexual Activity: Yes   Other Topics Concern  . Not on file   Social History Narrative    Family History  Problem Relation Age of Onset  . Diabetes Other   . Hyperlipidemia Other   . Lung cancer Other   . Hypertension Other   . Diabetes Mother   . Heart disease Father   . Heart attack Father   . Hypertension Father     Review of Systems  Constitutional: Negative for fever.  HENT: Positive for sore throat (scratchy x couple of days). Negative for congestion, ear pain and mouth sores.   Respiratory: Positive for cough (asthma) and wheezing (asthma). Negative for shortness of breath.   Neurological: Positive for light-headedness. Negative for headaches.       Objective:   Filed Vitals:   08/14/15 1401  BP: 168/78  Pulse: 100  Temp: 99.1 F (37.3 C)  Resp: 18   Filed Weights   08/14/15 1401  Weight: 300 lb (136.079 kg)   Body mass index is 41.86 kg/(m^2).   Physical Exam  Constitutional: He appears well-developed and well-nourished. No distress.  HENT:  Mouth/Throat: Oropharynx is clear and moist.  Lower lip lesion, circular, white (moist skin) in color with central pinpoint, non-tender, non-indurated, surrounding lip appears normal  Neck: Neck supple. No tracheal deviation present. No thyromegaly present.  Lymphadenopathy:    He has no cervical adenopathy.          Assessment & Plan:    Lip lesion Present for one month without pain or discomfort It is not consistent with a viral lesion such as herpes It does not look cancerous He denies trauma Advised him that I do not know exactly what it is Advised them to monitor for now and if it does not resolve on its own he should consider seeing a specialist-ENT. I will call if he needs a referral  He will follow-up with his primary care physician regarding his pressure-typically sees them twice a year

## 2015-08-14 NOTE — Progress Notes (Signed)
Pre visit review using our clinic review tool, if applicable. No additional management support is needed unless otherwise documented below in the visit note. 

## 2015-08-24 MED FILL — LOSARTAN-HCTZ 100-25 MG TAB: 100-25 | 30 days supply | Qty: 30 | Fill #0

## 2015-08-30 ENCOUNTER — Telehealth: Payer: Self-pay | Admitting: Internal Medicine

## 2015-08-30 NOTE — Telephone Encounter (Signed)
Patient Name: Victor Byrd  DOB: Oct 29, 1965    Initial Comment Caller states was in about 2 wks ago and saw different MD; has a spot on lip and said not sure what it is but to call to f/u if still has in 2 wks;    Nurse Assessment  Nurse: Raphael Gibney, RN, Vanita Ingles Date/Time ([place] Time): 08/30/2015 1:02:14 PM  Confirm and document reason for call. If symptomatic, describe symptoms. You must click the next button to save text entered. ---Caller states he was in the office a couple of weeks ago and saw a different doctor. He has a sore on his lip. Doctor said to call back if he still had the sore in a couple of weeks. Not tender to the touch.  Has the patient traveled out of the country within the last 30 days? ---Not Applicable  Does the patient have any new or worsening symptoms? ---Yes  Will a triage be completed? ---Yes  Related visit to physician within the last 2 weeks? ---Yes  Does the PT have any chronic conditions? (i.e. diabetes, asthma, etc.) ---Yes  List chronic conditions. ---HTN  Is this a behavioral health or substance abuse call? ---No     Guidelines    Guideline Title Affirmed Question Affirmed Notes  Cold Sores (Fever Blisters) Sores last > 2 weeks    Final Disposition User   See PCP When Office is Open (within 3 days) Raphael Gibney, RN, [place]    Comments  Appt scheduled for 08/30/15 at 1:45 pm with Dr. Mauricio Po  Appt scheduled for 08/31/15 at 1:45 pm with Dr. Mauricio Po   Disagree/Comply: Comply

## 2015-08-31 ENCOUNTER — Encounter: Payer: Self-pay | Admitting: Family

## 2015-08-31 ENCOUNTER — Ambulatory Visit (INDEPENDENT_AMBULATORY_CARE_PROVIDER_SITE_OTHER): Payer: 59 | Admitting: Family

## 2015-08-31 ENCOUNTER — Other Ambulatory Visit (INDEPENDENT_AMBULATORY_CARE_PROVIDER_SITE_OTHER): Payer: 59

## 2015-08-31 VITALS — BP 130/70 | HR 108 | Temp 98.7°F | Resp 18 | Ht 71.0 in | Wt 292.0 lb

## 2015-08-31 DIAGNOSIS — K130 Diseases of lips: Secondary | ICD-10-CM

## 2015-08-31 LAB — CBC
HCT: 41.4 % (ref 39.0–52.0)
Hemoglobin: 14.1 g/dL (ref 13.0–17.0)
MCHC: 34 g/dL (ref 30.0–36.0)
MCV: 80.5 fl (ref 78.0–100.0)
Platelets: 306 10*3/uL (ref 150.0–400.0)
RBC: 5.14 Mil/uL (ref 4.22–5.81)
RDW: 13.9 % (ref 11.5–15.5)
WBC: 10.9 10*3/uL — AB (ref 4.0–10.5)

## 2015-08-31 MED ORDER — NYSTATIN 100000 UNIT/ML MT SUSP: 5.0000 mL | Freq: Four times a day (QID) | OROMUCOSAL | Status: DC

## 2015-08-31 MED FILL — NYSTATIN 100,000 UNITS/ML S: 100000 | 3 days supply | Qty: 60 | Fill #0

## 2015-08-31 NOTE — Assessment & Plan Note (Signed)
Lip lesion of undetermined origin with differentials including candidiasis, HSV, or leukoplakia. Start Nystatin oral. Refer to ENT for possible biopsy if no improvement with Nystatin. Obtain CBC and HSV-1. Follow up if symptoms do not improve.

## 2015-08-31 NOTE — Progress Notes (Signed)
Pre visit review using our clinic review tool, if applicable. No additional management support is needed unless otherwise documented below in the visit note. 

## 2015-08-31 NOTE — Progress Notes (Signed)
Subjective:    Patient ID: Victor Byrd, male    DOB: 1965-06-27, 50 y.o.   MRN: [id number]  Chief Complaint  Patient presents with  . Sore    has a sore on his lip that has been there a couple of months, no pain     HPI:  Victor Byrd is a 50 y.o. male who  has a past medical history of Depression; OSA on CPAP; Diabetes mellitus type II; Restless legs; Asthma; Hypertension; Hyperthyroidism; and Hyperlipidemia. and presents today For an acute office visit.  Recently evaluated in the office throat was described as a bump on his lip have been going on for approximately one month. No trauma, pain or discomfort. At the time there is no change in size and it appeared as if was going away and came back. Physical exam with circular, white, moist skin with central pinpoint that is nontender and surrounding lip being normal. Described as not consistent with a viral lesion or herpes and did not appear cancerous. He was advised to continue to monitor and follow-up if it did not improve. Continues to experience the associated symptom of a lesion located in the center of his bottom lip. Described as white and non-painful. It waxes and wanes and appears slightly lighter at times and then returns. Denies any modifying factors/treatments that make it better. No changes that make it worse.  Allergies  Allergen Reactions  . Ramipril     cough     Current Outpatient Prescriptions on File Prior to Visit  Medication Sig Dispense Refill  . albuterol (VENTOLIN HFA) 108 (90 BASE) MCG/ACT inhaler INHALE 2 PUFFS INTO THE LUNGS EVERY 6 HOURS AS NEEDED FOR WHEEZING. 3 Inhaler 3  . amLODipine (NORVASC) 10 MG tablet TAKE 1 TABLET BY MOUTH ONCE DAILY 90 tablet 3  . aspirin 81 MG tablet Take 81 mg by mouth daily.      Marland Kitchen atorvastatin (LIPITOR) 40 MG tablet TAKE 1 TABLET BY MOUTH ONCE DAILY 90 tablet 3  . buPROPion (WELLBUTRIN XL) 150 MG 24 hr tablet Take 1 tablet (150 mg total) by mouth daily. Must keep 06/28/15  appt for refills 10 tablet 0  . Cholecalciferol (VITAMIN D3) 1000 UNITS tablet Take 1,000 Units by mouth daily.      . citalopram (CELEXA) 10 MG tablet Take 2 tablets (20 mg total) by mouth daily. Must keep 06/28/15 appt for refills 20 tablet 0  . glimepiride (AMARYL) 4 MG tablet Take 1 tablet by mouth 2 (two) times daily.  6  . LANTUS SOLOSTAR 100 UNIT/ML Solostar Pen 70 units every morning, 50 units every evening  3  . losartan-hydrochlorothiazide (HYZAAR) 100-25 MG tablet Take 1 tablet by mouth daily.  6  . metFORMIN (GLUCOPHAGE) 1000 MG tablet Take 1 tablet (1,000 mg total) by mouth 2 (two) times daily with a meal. 180 tablet 3  . nortriptyline (PAMELOR) 25 MG capsule TAKE 1 TO 2 CAPSULES BY MOUTH AT BEDTIME 180 capsule 3  . sildenafil (VIAGRA) 100 MG tablet Take 1 tablet (100 mg total) by mouth as needed for erectile dysfunction. 12 tablet 11   No current facility-administered medications on file prior to visit.    Review of Systems  Constitutional: Negative for fever and chills.  HENT:       Positive for lip lesion.       Objective:    BP 130/70 mmHg  Pulse 108  Temp(Src) 98.7 F (37.1 C) (Oral)  Resp 18  Ht  5\' 11"  (1.803 m)  Wt 292 lb (132.45 kg)  BMI 40.74 kg/m2  SpO2 96% Nursing note and vital signs reviewed.  Physical Exam  Constitutional: He is oriented to person, place, and time. He appears well-developed and well-nourished. No distress.  HENT:  1-2 mm annular, white, raised lesion with indurated center and well defined/demarcated borders with no discharge or tenderness.   Cardiovascular: Normal rate, regular rhythm, normal heart sounds and intact distal pulses.   Pulmonary/Chest: Effort normal and breath sounds normal.  Neurological: He is alert and oriented to person, place, and time.  Skin: Skin is warm and dry.  Psychiatric: He has a normal mood and affect. His behavior is normal. Judgment and thought content normal.       Assessment & Plan:   Problem List  Items Addressed This Visit      Other   Lip lesion - Primary    Lip lesion of undetermined origin with differentials including candidiasis, HSV, or leukoplakia. Start Nystatin oral. Refer to ENT for possible biopsy if no improvement with Nystatin. Obtain CBC and HSV-1. Follow up if symptoms do not improve.       Relevant Orders   CBC   HSV 1 antibody, IgG   Ambulatory referral to ENT

## 2015-08-31 NOTE — Patient Instructions (Addendum)
Thank you for choosing [facility].  Summary/Instructions:  Nystatin - 4x per day to the affected area.   Your prescription(s) have been submitted to your pharmacy or been printed and provided for you. Please take as directed and contact our office if you believe you are having problem(s) with the medication(s) or have any questions.  Please stop by the lab on the basement level of the building for your blood work. Your results will be released to [place] (or called to you) after review, usually within 72 hours after test completion. If any changes need to be made, you will be notified at that same time.  If your symptoms worsen or fail to improve, please contact our office for further instruction, or in case of emergency go directly to the emergency room at the closest medical facility.

## 2015-09-01 LAB — HSV 1 ANTIBODY, IGG: HSV 1 GLYCOPROTEIN G AB, IGG: 17.2 {index} — AB (ref <0.90)

## 2015-09-03 ENCOUNTER — Telehealth: Payer: Self-pay | Admitting: Family

## 2015-09-03 NOTE — Telephone Encounter (Signed)
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 If he has had no improvement with the Nystatin, I will send in a prescription for Valacyclovir.

## 2015-09-04 NOTE — Telephone Encounter (Signed)
LVM for pt to call back.

## 2015-09-05 MED FILL — LANTUS SOLOSTAR 100 UNITS/M: 100 | 87 days supply | Qty: 105 | Fill #0

## 2015-09-07 MED FILL — NORTRIPTYLINE HCL 25 MG CAP: 25 | 90 days supply | Qty: 180 | Fill #1

## 2015-09-07 NOTE — Telephone Encounter (Signed)
Pt aware of results .Marland Kitchen Please send in Valacyclovir .  Lovena Le gave him results

## 2015-09-08 ENCOUNTER — Other Ambulatory Visit: Payer: Self-pay

## 2015-09-08 MED ORDER — VALACYCLOVIR HCL 1 G PO TABS: 1000.0000 mg | ORAL_TABLET | Freq: Two times a day (BID) | ORAL | Status: DC

## 2015-09-08 MED FILL — valACYclovir HCL 1 GM TABS: 1 | 2 days supply | Qty: 4 | Fill #0

## 2015-09-15 ENCOUNTER — Other Ambulatory Visit: Payer: Self-pay | Admitting: Internal Medicine

## 2015-09-18 MED FILL — BUPROPION HCL XL 150 MG TAB: 150 | 90 days supply | Qty: 90 | Fill #0

## 2015-09-22 MED FILL — LOSARTAN-HCTZ 100-25 MG TAB: 100-25 | 30 days supply | Qty: 30 | Fill #1

## 2015-09-26 MED FILL — VIAGRA 100 MG TABLET: 100 | 30 days supply | Qty: 6 | Fill #2

## 2015-10-05 DIAGNOSIS — G609 Hereditary and idiopathic neuropathy, unspecified: Secondary | ICD-10-CM | POA: Diagnosis not present

## 2015-10-05 DIAGNOSIS — E7800 Pure hypercholesterolemia, unspecified: Secondary | ICD-10-CM | POA: Diagnosis not present

## 2015-10-05 DIAGNOSIS — E1165 Type 2 diabetes mellitus with hyperglycemia: Secondary | ICD-10-CM | POA: Diagnosis not present

## 2015-10-05 DIAGNOSIS — R809 Proteinuria, unspecified: Secondary | ICD-10-CM | POA: Diagnosis not present

## 2015-10-05 DIAGNOSIS — I10 Essential (primary) hypertension: Secondary | ICD-10-CM | POA: Diagnosis not present

## 2015-10-07 MED FILL — GLIMEPIRIDE 4 MG TABLET: 4 | 30 days supply | Qty: 60 | Fill #2

## 2015-10-07 MED FILL — CITALOPRAM HBR 10 MG TABLET: 10 | 90 days supply | Qty: 180 | Fill #1

## 2015-10-11 ENCOUNTER — Ambulatory Visit (INDEPENDENT_AMBULATORY_CARE_PROVIDER_SITE_OTHER): Payer: 59 | Admitting: Internal Medicine

## 2015-10-11 ENCOUNTER — Encounter: Payer: Self-pay | Admitting: Internal Medicine

## 2015-10-11 VITALS — BP 108/78 | HR 94 | Wt 301.0 lb

## 2015-10-11 DIAGNOSIS — M7632 Iliotibial band syndrome, left leg: Secondary | ICD-10-CM | POA: Diagnosis not present

## 2015-10-11 DIAGNOSIS — M7630 Iliotibial band syndrome, unspecified leg: Secondary | ICD-10-CM | POA: Insufficient documentation

## 2015-10-11 DIAGNOSIS — R609 Edema, unspecified: Secondary | ICD-10-CM | POA: Insufficient documentation

## 2015-10-11 DIAGNOSIS — R600 Localized edema: Secondary | ICD-10-CM | POA: Diagnosis not present

## 2015-10-11 MED ORDER — DICLOFENAC SODIUM 75 MG PO TBEC: 75.0000 mg | DELAYED_RELEASE_TABLET | Freq: Two times a day (BID) | ORAL | Status: DC

## 2015-10-11 MED FILL — DICLOFENAC SOD EC 75 MG TAB: 75 | 15 days supply | Qty: 30 | Fill #0

## 2015-10-11 NOTE — Assessment & Plan Note (Signed)
5/17 L distal IT band strain Stretch

## 2015-10-11 NOTE — Patient Instructions (Signed)
Compression knee highsIliotibial Band Syndrome Iliotibial band syndrome is pain in the outer, lower thigh. The pain is caused by an inflammation of the iliotibial band. This is a band of thick fibrous tissue that runs down the outside of the thigh. The iliotibial band begins at the hip. It extends to the outer side of the shin bone (tibia) just below the knee joint. The band works with the thigh muscles. Together they provide stability to the outside of the knee joint. Iliotibial band syndrome occurs when there is inflammation to this band of tissue. This is typically due to over use and not due to an injury. The irritation usually occurs over the outside of the knee joint, at the the end of the thigh bone (femur). The iliotibial band crosses bone and muscle at this point. Between these structures is a cushioning sac (bursa). The bursa should make possible a smooth gliding motion. However, when inflamed, the iliotibial band does not glide easily. When inflamed, there is pain with motion of the knee. Usually the pain worsens with continued movement and the pain goes away with rest. This problem usually arises when there is a sudden increase in sports activities involving your legs. Running, and playing soccer or basketball are examples of activities causing this. Others who are prone to iliotibial band syndrome include individuals with mechanical problems such as leg length differences, abnormality of walking, bowed legs etc. HOME CARE INSTRUCTIONS   Apply ice to the injured area:  Put ice in a plastic bag.  Place a towel between your skin and the bag.  Leave the ice on for 20 minutes, 2-3 times a day.  Limit excessive training or eliminate training until pain goes away.  While pain is present, you may use gentle range of motion. Do not resume regular use until instructed by your health care provider. Begin use gradually. Do not increase activity to the point of pain. If pain does develop, decrease  activity and continue the above measures. Gradually increase activities that do not cause discomfort. Do this until you finally achieve normal use.  Perform low-impact activities while pain is present. Wear proper footwear.  Only take over-the-counter or prescription medicines for pain, discomfort, or fever as directed by your health care provider. SEEK MEDICAL CARE IF:   Your pain increases or pain is not controlled with medications.  You develop new, unexplained symptoms, or an increase of the symptoms that brought you to your health care provider.  Your pain and symptoms are not improving or are getting worse.   This information is not intended to replace advice given to you by your health care provider. Make sure you discuss any questions you have with your health care provider.   Document Released: 11/09/2001 Document Revised: 06/10/2014 Document Reviewed: 12/17/2012 Elsevier Interactive Patient Education [employer].

## 2015-10-11 NOTE — Assessment & Plan Note (Signed)
Poss due to obesity and amlodipine - may need to reduce dose to 5 mg/d B ankles Compression knee highs

## 2015-10-11 NOTE — Progress Notes (Signed)
Subjective:  Patient ID: Victor Byrd, male    DOB: [birth year]  Age: 50 y.o. 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 albuterol (VENTOLIN HFA) 108 (90 BASE) MCG/ACT inhaler INHALE 2 PUFFS INTO THE LUNGS EVERY 6 HOURS AS NEEDED FOR WHEEZING. 3 Inhaler 3  . amLODipine (NORVASC) 10 MG tablet TAKE 1 TABLET BY MOUTH ONCE DAILY 90 tablet 3  . aspirin 81 MG tablet Take 81 mg by mouth daily.      Marland Kitchen atorvastatin (LIPITOR) 40 MG tablet TAKE 1 TABLET BY MOUTH ONCE DAILY 90 tablet 3  . buPROPion (WELLBUTRIN XL) 150 MG 24 hr tablet Take 1 tablet (150 mg total) by mouth daily. Must keep 06/28/15 appt for refills 10 tablet 0  . buPROPion (WELLBUTRIN XL) 150 MG 24 hr tablet Take 1 tablet (150 mg total) by mouth daily. 90 tablet 3  . Cholecalciferol (VITAMIN D3) 1000 UNITS tablet Take 1,000 Units by mouth daily.      . citalopram (CELEXA) 10 MG tablet Take 2 tablets (20 mg total) by mouth daily. Must keep 06/28/15 appt for refills 20 tablet 0  . glimepiride (AMARYL) 4 MG tablet Take 1 tablet by mouth 2 (two) times daily.  6  . LANTUS SOLOSTAR 100 UNIT/ML Solostar Pen 70 units every morning, 50 units every evening  3  . losartan-hydrochlorothiazide (HYZAAR) 100-25 MG tablet Take 1 tablet by mouth daily.  6  . metFORMIN (GLUCOPHAGE) 1000 MG tablet Take 1 tablet (1,000 mg total) by mouth 2 (two) times daily with a meal. 180 tablet 3  . nortriptyline (PAMELOR) 25 MG capsule TAKE 1 TO 2 CAPSULES BY MOUTH AT BEDTIME 180 capsule 3  . nystatin (MYCOSTATIN) 100000 UNIT/ML suspension Take 5 mLs (500,000 Units total) by mouth 4 (four) times daily. 60 mL 0  . sildenafil (VIAGRA) 100 MG tablet Take 1 tablet (100 mg total) by mouth as needed for erectile dysfunction. 12 tablet 11  . valACYclovir (VALTREX) 1000 MG tablet Take 1 tablet (1,000 mg total)  by mouth 2 (two) times daily. 4 tablet 0   No facility-administered medications prior to visit.    ROS Review of Systems  Constitutional: Negative for appetite change, fatigue and unexpected weight change.  HENT: Negative for congestion, nosebleeds, sneezing, sore throat and trouble swallowing.   Eyes: Negative for itching and visual disturbance.  Respiratory: Negative for cough.   Cardiovascular: Negative for chest pain, palpitations and leg swelling.  Gastrointestinal: Negative for nausea, diarrhea, blood in stool and abdominal distention.  Genitourinary: Negative for frequency and hematuria.  Musculoskeletal: Positive for arthralgias and gait problem. Negative for back pain, joint swelling and neck pain.  Skin: Negative for rash.  Neurological: Negative for dizziness, tremors, speech difficulty and weakness.  Psychiatric/Behavioral: Negative for sleep disturbance, dysphoric mood and agitation. The patient is not nervous/anxious.     Objective:  BP 108/78 mmHg  Pulse 94  Wt 301 lb (136.533 kg)  SpO2 96%  BP Readings from Last 3 Encounters:  10/11/15 108/78  08/31/15 130/70  08/14/15 168/78    Wt Readings from Last 3 Encounters:  10/11/15 301 lb (136.533 kg)  08/31/15 292 lb (132.45 kg)  08/14/15 300 lb (136.079 kg)    Physical Exam  Constitutional: No distress.  Cardiovascular: Normal rate.   No murmur  heard. Pulmonary/Chest: He has no wheezes.  Musculoskeletal: He exhibits edema and tenderness.  Skin: No rash noted.  distal IT band is very tender Edema B ankles 1+ Obese  Lab Results  Component Value Date   WBC 10.9* 08/31/2015   HGB 14.1 08/31/2015   HCT 41.4 08/31/2015   PLT 306.0 08/31/2015   GLUCOSE 194* 06/29/2015   CHOL 127 06/29/2015   TRIG 114.0 06/29/2015   HDL 44.60 06/29/2015   LDLCALC 59 06/29/2015   ALT 48 06/29/2015   AST 28 06/29/2015   NA 140 06/29/2015   K 4.2 06/29/2015   CL 101 06/29/2015   CREATININE 0.79 06/29/2015   BUN 17  06/29/2015   CO2 29 06/29/2015   TSH 2.68 06/29/2015   PSA 0.69 06/29/2015   HGBA1C 8.6* 12/20/2013    Dg Ugi W/kub  03/20/2011  *RADIOLOGY REPORT* Clinical Data:  Morbid obesity.  Pre-op evaluation for bariatric surgery. UPPER GI SERIES WITH KUB Technique:  After obtaining a scout radiograph a single-column upper GI series was performed using thin barium. Fluoroscopy time: 1.8 minutes Findings:  The scout radiograph shows a normal bowel gas pattern. There is no evidence of esophageal mass or stricture.  There is no evidence of hiatal hernia, and no gastroesophageal reflux was seen during the exam.  Esophageal motility is within normal limits. The stomach is normal in appearance.  There is no evidence of gastric masses or ulcers.  Duodenal bulb and sweep are normal in appearance. IMPRESSION: Negative UGI series. Original Report Authenticated By: Marlaine Hind, M.D.   Assessment & Plan:   There are no diagnoses linked to this encounter. I am having Victor Byrd maintain his aspirin, cholecalciferol, metFORMIN, albuterol, nortriptyline, buPROPion, citalopram, glimepiride, LANTUS SOLOSTAR, losartan-hydrochlorothiazide, sildenafil, atorvastatin, amLODipine, nystatin, valACYclovir, and buPROPion.  No orders of the defined types were placed in this encounter.     Follow-up: No Follow-up on file.  Walker Kehr, MD

## 2015-10-11 NOTE — Progress Notes (Signed)
Pre visit review using our clinic review tool, if applicable. No additional management support is needed unless otherwise documented below in the visit note. 

## 2015-10-12 DIAGNOSIS — D3701 Neoplasm of uncertain behavior of lip: Secondary | ICD-10-CM | POA: Diagnosis not present

## 2015-10-12 DIAGNOSIS — E1165 Type 2 diabetes mellitus with hyperglycemia: Secondary | ICD-10-CM | POA: Diagnosis not present

## 2015-10-19 MED FILL — ATORVASTATIN 40 MG TABLET: 40 | 90 days supply | Qty: 90 | Fill #1

## 2015-10-19 MED FILL — METFORMIN HCL ER 500 MG TAB: 500 | 90 days supply | Qty: 360 | Fill #2

## 2015-10-23 MED FILL — LOSARTAN-HCTZ 100-25 MG TAB: 100-25 | 30 days supply | Qty: 30 | Fill #2

## 2015-11-02 MED FILL — VIAGRA 100 MG TABLET: 100 | 30 days supply | Qty: 6 | Fill #3

## 2015-11-03 MED FILL — LANTUS SOLOSTAR 100 UNITS/M: 100 | 90 days supply | Qty: 135 | Fill #0

## 2015-11-07 MED FILL — AMLODIPINE BESYLATE 10 MG T: 10 | 90 days supply | Qty: 90 | Fill #1

## 2015-11-15 DIAGNOSIS — E7800 Pure hypercholesterolemia, unspecified: Secondary | ICD-10-CM | POA: Diagnosis not present

## 2015-11-15 DIAGNOSIS — E1165 Type 2 diabetes mellitus with hyperglycemia: Secondary | ICD-10-CM | POA: Diagnosis not present

## 2015-11-15 DIAGNOSIS — I10 Essential (primary) hypertension: Secondary | ICD-10-CM | POA: Diagnosis not present

## 2015-11-15 DIAGNOSIS — G609 Hereditary and idiopathic neuropathy, unspecified: Secondary | ICD-10-CM | POA: Diagnosis not present

## 2015-11-24 MED FILL — DICLOFENAC SOD EC 75 MG TAB: 75 | 15 days supply | Qty: 30 | Fill #1

## 2015-11-29 MED FILL — NORTRIPTYLINE HCL 25 MG CAP: 25 | 90 days supply | Qty: 180 | Fill #2

## 2015-11-30 ENCOUNTER — Other Ambulatory Visit: Payer: Self-pay

## 2015-11-30 VITALS — BP 140/86 | HR 92 | Resp 16 | Ht 72.0 in | Wt 303.0 lb

## 2015-11-30 DIAGNOSIS — IMO0002 Reserved for concepts with insufficient information to code with codable children: Secondary | ICD-10-CM

## 2015-11-30 DIAGNOSIS — E1140 Type 2 diabetes mellitus with diabetic neuropathy, unspecified: Secondary | ICD-10-CM

## 2015-11-30 DIAGNOSIS — E1165 Type 2 diabetes mellitus with hyperglycemia: Principal | ICD-10-CM

## 2015-11-30 NOTE — Patient Instructions (Signed)
1. Plan to attend required meeting with RD at the [facility].  They will call you to schedule or call them at [phone] 2. Plan to eat 60-75 GM (4-5) servings of carbohydrate a meal and 15 GM for snacks.  Plan to eat protein with your snacks 3. Plan to check blood sugar daily either fasting or 1 -2hrs after a meal.  Goals of 80-130 fasting and 180 or less after eating. 4. Plan to walk for 30 minutes 5 times a week 5. Plan to complete EMMI programs by 03/03/16 6. Plan to see Dr. Chalmers Cater on 02/07/16 7. Plan to return to [facility] on 03/14/16 at 10:30AM

## 2015-11-30 NOTE — Patient Outreach (Signed)
[facility]) Care Management   11/30/2015  TALIK PERITO May 20, 1966 [id number]  Victor Byrd is an 50 y.o. male.   Member seen for initial office visit for [facility] program for self management of Type 2 diabetes  Subjective: Member states that he would like to get healthier and use less insulin.  States that he saw Dr.Balan earlier this month and his hemoglobin A1C had gone down to 8.2% from 9.5%.  States he has difficulty with watching the portions of his food especially rice.  States that he eats out for dinner frequently.  States he does not exercise regularly other than the walking he gets at work.    Objective:   Review of Systems  All other systems reviewed and are negative. Reviewed glucometer 7 day average-182 14 day average-178 30 day average-166  Physical Exam Today's Vitals   11/30/15 1104  BP: 140/86  Pulse: 92  Resp: 16  Height: 1.829 m (6')  Weight: 303 lb (137.44 kg)  SpO2: 94%  PainSc: 0-No pain   Encounter Medications:   Outpatient Encounter Prescriptions as of 11/30/2015  Medication Sig Note  . albuterol (VENTOLIN HFA) 108 (90 BASE) MCG/ACT inhaler INHALE 2 PUFFS INTO THE LUNGS EVERY 6 HOURS AS NEEDED FOR WHEEZING.   Marland Kitchen amLODipine (NORVASC) 10 MG tablet TAKE 1 TABLET BY MOUTH ONCE DAILY   . aspirin 81 MG tablet Take 81 mg by mouth daily.     Marland Kitchen atorvastatin (LIPITOR) 40 MG tablet TAKE 1 TABLET BY MOUTH ONCE DAILY   . buPROPion (WELLBUTRIN XL) 150 MG 24 hr tablet Take 1 tablet (150 mg total) by mouth daily. Must keep 06/28/15 appt for refills   . buPROPion (WELLBUTRIN XL) 150 MG 24 hr tablet Take 1 tablet (150 mg total) by mouth daily.   . Cholecalciferol (VITAMIN D3) 1000 UNITS tablet Take 1,000 Units by mouth daily.     . citalopram (CELEXA) 10 MG tablet Take 2 tablets (20 mg total) by mouth daily. Must keep 06/28/15 appt for refills   . diclofenac (VOLTAREN) 75 MG EC tablet Take 1 tablet (75 mg total) by mouth 2 (two) times daily.    Marland Kitchen glimepiride (AMARYL) 4 MG tablet Take 1 tablet by mouth 2 (two) times daily. 06/28/2015: Received from: External Pharmacy Received Sig:   . LANTUS SOLOSTAR 100 UNIT/ML Solostar Pen 75 Units 2 (two) times daily. 75 units every morning, 75 units every evening 06/28/2015: Received from: External Pharmacy Received Sig:   . losartan-hydrochlorothiazide (HYZAAR) 100-25 MG tablet Take 1 tablet by mouth daily. 06/28/2015: Received from: External Pharmacy Received Sig:   . metFORMIN (GLUCOPHAGE) 1000 MG tablet Take 1 tablet (1,000 mg total) by mouth 2 (two) times daily with a meal.   . nortriptyline (PAMELOR) 25 MG capsule TAKE 1 TO 2 CAPSULES BY MOUTH AT BEDTIME   . sildenafil (VIAGRA) 100 MG tablet Take 1 tablet (100 mg total) by mouth as needed for erectile dysfunction.   . valACYclovir (VALTREX) 1000 MG tablet Take 1 tablet (1,000 mg total) by mouth 2 (two) times daily.   Marland Kitchen nystatin (MYCOSTATIN) 100000 UNIT/ML suspension Take 5 mLs (500,000 Units total) by mouth 4 (four) times daily. (Patient not taking: Reported on 11/30/2015)    No facility-administered encounter medications on file as of 11/30/2015.    Functional Status:   In your present state of health, do you have any difficulty performing the following activities: 11/30/2015  Hearing? N  Vision? N  Difficulty concentrating or  making decisions? N  Walking or climbing stairs? N  Dressing or bathing? N  Doing errands, shopping? N    Fall/Depression Screening:    PHQ 2/9 Scores 11/30/2015  PHQ - 2 Score 1    Assessment:   Member seen for initial office visit for [facility] program for self management of Type 2 diabetes.  Member is not meeting diabetes self management goal of hemoglobin A1C of 75 or below with last reported result of 8.2%.  Member is followed by endocrinologist and is to see her again on 02/07/16.  Reports difficulty with adherence with diet and exercise.  Member will be referred to see RD at [facility].  Reports being up to date with annual eye exam and dental check ups  Plan:   Plan to attend required meeting with RD at [facility].  They will call you to schedule or call them at [phone] Plan to eat 60-75 GM (4-5) servings of carbohydrate a meal and 15 GM for snacks.  Plan to eat protein with your snacks Plan to check blood sugar daily either fasting or 1 -2hrs after a meal.  Goals of 80-130 fasting and 180 or less after eating. Plan to walk for 30 minutes 5 times a week Plan to complete EMMI programs by 03/03/16 Plan to see Dr. Chalmers Cater on 02/07/16 Plan to return to [facility] on 03/14/16 at 10:30AM  [facility] CM Care Plan Problem One        Most Recent Value   Care Plan Problem One  Elevated blood sugar related to dx of Type 2 DM   Role Documenting the Problem One  Care Management Coordinator   Care Plan for Problem One  Active   [facility] Long Term Goal (31-90 days)  Member will decrease hemoglobin A1C to 75 or below by the next [facility] visit   [place] Start Date  11/30/15   Interventions for Problem One Long Term Goal  Given [facility] diabetes teaching packet and reviewed program requirements, Instructed on CHO counting and portion control , Reviewed portion size of rice , Instructed on use of medications and possible side effects especially to be sure to eat when he takes his glimepride, Instructed on blood sugar goals and how they relate to his hemoglobin A1C,  Instructed on the importance of regular exercise to glycemic control, Given voucher for blood pressure monitor and instructed on importance of keeping B/P under control  with his diabetes    [name] RN, [facility] Care Management Coordinator-[facility] [place] [facility] [phone]

## 2015-12-08 MED FILL — VIAGRA 100 MG TABLET: 100 | 30 days supply | Qty: 6 | Fill #4

## 2015-12-09 MED FILL — GLIMEPIRIDE 4 MG TABLET: 4 | 30 days supply | Qty: 60 | Fill #3

## 2015-12-17 MED FILL — LOSARTAN-HCTZ 100-25 MG TAB: 100-25 | 30 days supply | Qty: 30 | Fill #3

## 2015-12-18 ENCOUNTER — Other Ambulatory Visit: Payer: Self-pay | Admitting: Internal Medicine

## 2015-12-18 MED FILL — DICLOFENAC SOD EC 75 MG TAB: 75 | 15 days supply | Qty: 30 | Fill #0

## 2015-12-18 MED FILL — ONETOUCH VERIO TEST STRIP: 30 days supply | Qty: 100 | Fill #0

## 2015-12-25 MED FILL — BUPROPION HCL XL 150 MG TAB: 150 | 90 days supply | Qty: 90 | Fill #1

## 2016-01-04 ENCOUNTER — Ambulatory Visit: Payer: 59 | Admitting: *Deleted

## 2016-01-11 MED FILL — METFORMIN HCL ER 500 MG TAB: 500 | 90 days supply | Qty: 360 | Fill #3

## 2016-01-11 MED FILL — ATORVASTATIN 40 MG TABLET: 40 | 90 days supply | Qty: 90 | Fill #2

## 2016-01-11 MED FILL — CITALOPRAM HBR 10 MG TABLET: 10 | 90 days supply | Qty: 180 | Fill #2

## 2016-01-17 ENCOUNTER — Ambulatory Visit: Payer: 59 | Admitting: *Deleted

## 2016-01-19 MED FILL — LOSARTAN-HCTZ 100-25 MG TAB: 100-25 | 30 days supply | Qty: 30 | Fill #4

## 2016-01-25 MED FILL — VIAGRA 100 MG TABLET: 100 | 30 days supply | Qty: 6 | Fill #5

## 2016-02-12 MED FILL — AMLODIPINE BESYLATE 10 MG T: 10 | 90 days supply | Qty: 90 | Fill #2

## 2016-02-12 MED FILL — GLIMEPIRIDE 4 MG TABLET: 4 | 30 days supply | Qty: 60 | Fill #4

## 2016-02-15 MED FILL — LANTUS SOLOSTAR 100 UNITS/M: 100 | 90 days supply | Qty: 135 | Fill #1

## 2016-02-26 MED FILL — NORTRIPTYLINE HCL 25 MG CAP: 25 | 90 days supply | Qty: 180 | Fill #3

## 2016-03-03 MED FILL — LOSARTAN-HCTZ 100-25 MG TAB: 100-25 | 30 days supply | Qty: 30 | Fill #5

## 2016-03-12 MED FILL — VIAGRA 100 MG TABLET: 100 | 30 days supply | Qty: 6 | Fill #6

## 2016-03-13 ENCOUNTER — Encounter: Payer: Self-pay | Admitting: Skilled Nursing Facility1

## 2016-03-13 ENCOUNTER — Ambulatory Visit (INDEPENDENT_AMBULATORY_CARE_PROVIDER_SITE_OTHER): Payer: 59 | Admitting: Internal Medicine

## 2016-03-13 ENCOUNTER — Other Ambulatory Visit (INDEPENDENT_AMBULATORY_CARE_PROVIDER_SITE_OTHER): Payer: 59

## 2016-03-13 ENCOUNTER — Encounter: Payer: 59 | Attending: Internal Medicine | Admitting: Skilled Nursing Facility1

## 2016-03-13 ENCOUNTER — Encounter: Payer: Self-pay | Admitting: Internal Medicine

## 2016-03-13 DIAGNOSIS — R600 Localized edema: Secondary | ICD-10-CM

## 2016-03-13 DIAGNOSIS — I10 Essential (primary) hypertension: Secondary | ICD-10-CM

## 2016-03-13 DIAGNOSIS — E1140 Type 2 diabetes mellitus with diabetic neuropathy, unspecified: Secondary | ICD-10-CM

## 2016-03-13 DIAGNOSIS — E119 Type 2 diabetes mellitus without complications: Secondary | ICD-10-CM | POA: Diagnosis not present

## 2016-03-13 DIAGNOSIS — Z713 Dietary counseling and surveillance: Secondary | ICD-10-CM | POA: Insufficient documentation

## 2016-03-13 DIAGNOSIS — E1165 Type 2 diabetes mellitus with hyperglycemia: Secondary | ICD-10-CM

## 2016-03-13 DIAGNOSIS — IMO0002 Reserved for concepts with insufficient information to code with codable children: Secondary | ICD-10-CM

## 2016-03-13 LAB — BASIC METABOLIC PANEL
BUN: 14 mg/dL (ref 6–23)
CALCIUM: 9 mg/dL (ref 8.4–10.5)
CHLORIDE: 102 meq/L (ref 96–112)
CO2: 30 meq/L (ref 19–32)
Creatinine, Ser: 0.81 mg/dL (ref 0.40–1.50)
GFR: 107.78 mL/min (ref >60.00)
Glucose, Bld: 170 mg/dL — ABNORMAL HIGH (ref 70–99)
POTASSIUM: 3.8 meq/L (ref 3.5–5.1)
SODIUM: 140 meq/L (ref 135–145)

## 2016-03-13 LAB — HEMOGLOBIN A1C: Hgb A1c MFr Bld: 8.8 % — ABNORMAL HIGH (ref 4.6–6.5)

## 2016-03-13 MED ORDER — AMLODIPINE BESYLATE 10 MG PO TABS: 5.0000 mg | ORAL_TABLET | Freq: Every day | ORAL | 3 refills | Status: DC

## 2016-03-13 MED ORDER — TRIAMCINOLONE ACETONIDE 0.5 % EX CREA: 1.0000 "application " | TOPICAL_CREAM | Freq: Three times a day (TID) | CUTANEOUS | 2 refills | Status: DC

## 2016-03-13 MED ORDER — FUROSEMIDE 20 MG PO TABS: 20.0000 mg | ORAL_TABLET | Freq: Every day | ORAL | 2 refills | Status: DC | PRN

## 2016-03-13 MED FILL — TRIAMCINOLONE 0.5% CREAM: 0.5 | 20 days supply | Qty: 60 | Fill #0

## 2016-03-13 MED FILL — FUROSEMIDE 20 MG TABLET: 20 | 90 days supply | Qty: 180 | Fill #0

## 2016-03-13 NOTE — Progress Notes (Signed)
Pt states he has attended core classes and he was regularly going until his wife got laid off of [employer] and was [facility].  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 Pt states he has had some swelling in his legs with weeping. Pt states the  Has Wounds on his feet and legs.Pt states his feet look dirty so he scrubs them in the shower (Dietitian advised he stop scrubbing his feet due to possible harm being caused). Pt states his wt Goal is to be 200 pounds. Pt states he checks his blood sugar Every morning and is usually 150+. Pt states he is constantly Being watched by his daughter and wife. PT states his daughter will say are you sure you really need that which bothers the pt. Pt states his daughter has been diagnosed with a fear of school which she constantly uses as a way to get out of helping take care of her many different animals which live in the house (pig, dogs, cats, [place] pig, etc.). Pt expressed a various many stresses in his life that he feels he has no control over. Pt states he is married but has no support his tells him to stop throwing himself a pity party when he tries to talk to her about his stress.  Dietitian actively listened to the pt as he detailed the many stresses of his life.  Goals: -Look into a podiatrist but in the mean time have your endocrinologist or primary care doc take a look at your feet  -Look into a financial advisor with [place] [employer] or your bank  -Put the pig outside  -Right when you get home do one thing either finish or start: get rid of the clutter in the garage  -Talk with your wife about AS A TEAM how you will get the house back under control: what each others responsibilities are and what your expectations are for one another   -Be thinking about other things that decrease your stress level

## 2016-03-13 NOTE — Progress Notes (Signed)
Subjective:  Patient ID: Victor Byrd, male    DOB: Apr 23, 1966  Age: 50 y.o. MRN: [id number]  CC: Leg Swelling (Bilateral x 2 months worsening x 1-2 wks) and Urticaria (on bilateral legs last week, has mostly resolved now)   HPI Victor Byrd presents for B leg edema, hives on legs. F/u HTN, DM.   Outpatient Medications Prior to Visit  Medication Sig Dispense Refill  . albuterol (VENTOLIN HFA) 108 (90 BASE) MCG/ACT inhaler INHALE 2 PUFFS INTO THE LUNGS EVERY 6 HOURS AS NEEDED FOR WHEEZING. 3 Inhaler 3  . amLODipine (NORVASC) 10 MG tablet TAKE 1 TABLET BY MOUTH ONCE DAILY 90 tablet 3  . aspirin 81 MG tablet Take 81 mg by mouth daily.      Marland Kitchen atorvastatin (LIPITOR) 40 MG tablet TAKE 1 TABLET BY MOUTH ONCE DAILY 90 tablet 3  . buPROPion (WELLBUTRIN XL) 150 MG 24 hr tablet Take 1 tablet (150 mg total) by mouth daily. Must keep 06/28/15 appt for refills 10 tablet 0  . buPROPion (WELLBUTRIN XL) 150 MG 24 hr tablet Take 1 tablet (150 mg total) by mouth daily. 90 tablet 3  . Cholecalciferol (VITAMIN D3) 1000 UNITS tablet Take 1,000 Units by mouth daily.      . citalopram (CELEXA) 10 MG tablet Take 2 tablets (20 mg total) by mouth daily. Must keep 06/28/15 appt for refills 20 tablet 0  . diclofenac (VOLTAREN) 75 MG EC tablet TAKE 1 TABLET BY MOUTH TWICE DAILY 30 tablet 1  . glimepiride (AMARYL) 4 MG tablet Take 1 tablet by mouth 2 (two) times daily.  6  . LANTUS SOLOSTAR 100 UNIT/ML Solostar Pen 75 Units 2 (two) times daily. 75 units every morning, 75 units every evening  3  . losartan-hydrochlorothiazide (HYZAAR) 100-25 MG tablet Take 1 tablet by mouth daily.  6  . metFORMIN (GLUCOPHAGE) 1000 MG tablet Take 1 tablet (1,000 mg total) by mouth 2 (two) times daily with a meal. 180 tablet 3  . nortriptyline (PAMELOR) 25 MG capsule TAKE 1 TO 2 CAPSULES BY MOUTH AT BEDTIME 180 capsule 3  . nystatin (MYCOSTATIN) 100000 UNIT/ML suspension Take 5 mLs (500,000 Units total) by mouth 4 (four) times daily. 60  mL 0  . sildenafil (VIAGRA) 100 MG tablet Take 1 tablet (100 mg total) by mouth as needed for erectile dysfunction. 12 tablet 11  . valACYclovir (VALTREX) 1000 MG tablet Take 1 tablet (1,000 mg total) by mouth 2 (two) times daily. 4 tablet 0   No facility-administered medications prior to visit.     ROS Review of Systems  Constitutional: Negative for appetite change, fatigue and unexpected weight change.  HENT: Negative for congestion, nosebleeds, sneezing, sore throat and trouble swallowing.   Eyes: Negative for itching and visual disturbance.  Respiratory: Negative for cough.   Cardiovascular: Negative for chest pain, palpitations and leg swelling.  Gastrointestinal: Negative for abdominal distention, blood in stool, diarrhea and nausea.  Genitourinary: Negative for frequency and hematuria.  Musculoskeletal: Negative for back pain, gait problem, joint swelling and neck pain.  Skin: Negative for rash.  Neurological: Negative for dizziness, tremors, speech difficulty and weakness.  Psychiatric/Behavioral: Negative for agitation, dysphoric mood and sleep disturbance. The patient is not nervous/anxious.     Objective:  BP 140/70   Pulse (!) 105   Temp 99.2 F (37.3 C) (Oral)   Wt (!) 306 lb (138.8 kg)   SpO2 95%   BMI 41.50 kg/m   BP Readings from Last 3 Encounters:  03/13/16 140/70  11/30/15 140/86  10/11/15 108/78    Wt Readings from Last 3 Encounters:  03/13/16 (!) 306 lb (138.8 kg)  03/13/16 (!) 305 lb (138.3 kg)  11/30/15 (!) 303 lb (137.4 kg)    Physical Exam  Constitutional: He is oriented to person, place, and time. He appears well-developed. No distress.  NAD  HENT:  Mouth/Throat: Oropharynx is clear and moist.  Eyes: Conjunctivae are normal. Pupils are equal, round, and reactive to light.  Neck: Normal range of motion. No JVD present. No thyromegaly present.  Cardiovascular: Normal rate, regular rhythm, normal heart sounds and intact distal pulses.  Exam  reveals no gallop and no friction rub.   No murmur heard. Pulmonary/Chest: Effort normal and breath sounds normal. No respiratory distress. He has no wheezes. He has no rales. He exhibits no tenderness.  Abdominal: Soft. Bowel sounds are normal. He exhibits no distension and no mass. There is no tenderness. There is no rebound and no guarding.  Musculoskeletal: Normal range of motion. He exhibits no edema or tenderness.  Lymphadenopathy:    He has no cervical adenopathy.  Neurological: He is alert and oriented to person, place, and time. He has normal reflexes. No cranial nerve deficit. He exhibits normal muscle tone. He displays a negative Romberg sign. Coordination and gait normal.  Skin: Skin is warm and dry. No rash noted.  Psychiatric: He has a normal mood and affect. His behavior is normal. Judgment and thought content normal.  1+ edema B, NT; brown discoloration and eczema like rash on the shins Obese  Lab Results  Component Value Date   WBC 10.9 (H) 08/31/2015   HGB 14.1 08/31/2015   HCT 41.4 08/31/2015   PLT 306.0 08/31/2015   GLUCOSE 170 (H) 03/13/2016   CHOL 127 06/29/2015   TRIG 114.0 06/29/2015   HDL 44.60 06/29/2015   LDLCALC 59 06/29/2015   ALT 48 06/29/2015   AST 28 06/29/2015   NA 140 03/13/2016   K 3.8 03/13/2016   CL 102 03/13/2016   CREATININE 0.81 03/13/2016   BUN 14 03/13/2016   CO2 30 03/13/2016   TSH 2.68 06/29/2015   PSA 0.69 06/29/2015   HGBA1C 8.8 (H) 03/13/2016    Dg Ugi W/kub  Result Date: 03/20/2011 *RADIOLOGY REPORT* Clinical Data:  Morbid obesity.  Pre-op evaluation for bariatric surgery. UPPER GI SERIES WITH KUB Technique:  After obtaining a scout radiograph a single-column upper GI series was performed using thin barium. Fluoroscopy time: 1.8 minutes Findings:  The scout radiograph shows a normal bowel gas pattern. There is no evidence of esophageal mass or stricture.  There is no evidence of hiatal hernia, and no gastroesophageal reflux was  seen during the exam.  Esophageal motility is within normal limits. The stomach is normal in appearance.  There is no evidence of gastric masses or ulcers.  Duodenal bulb and sweep are normal in appearance. IMPRESSION: Negative UGI series. Original Report Authenticated By: Marlaine Hind, M.D.   Assessment & Plan:   There are no diagnoses linked to this encounter. I am having Mr. Ikner maintain his aspirin, cholecalciferol, metFORMIN, albuterol, nortriptyline, buPROPion, citalopram, glimepiride, LANTUS SOLOSTAR, losartan-hydrochlorothiazide, sildenafil, atorvastatin, amLODipine, nystatin, valACYclovir, buPROPion, and diclofenac.  No orders of the defined types were placed in this encounter.    Follow-up: No Follow-up on file.  Walker Kehr, MD

## 2016-03-13 NOTE — Patient Instructions (Addendum)
-  Look into a podiatrist but in the mean time have your endocrinologist or primary care doc take a look at your feet  -Look into a financial advisor with [employer] or your bank  -Put the pig outside  -Right when you get home do one thing either finish or start: get rid of the clutter in the garage  -Talk with your wife about AS A TEAM how you will get the house back under control: what each others responsibilities are and what your expectations are for one another   -Be thinking about other things that decrease your stress level

## 2016-03-13 NOTE — Progress Notes (Signed)
Pre visit review using our clinic review tool, if applicable. No additional management support is needed unless otherwise documented below in the visit note. 

## 2016-03-13 NOTE — Assessment & Plan Note (Addendum)
Poss due to obesity and amlodipine - worse; reduce to 5 mg B ankles Compression knee highs Lasix prn

## 2016-03-13 NOTE — Patient Instructions (Addendum)
Amlodipine - reduce to 5 mg a day     Venous Stasis or Chronic Venous Insufficiency Chronic venous insufficiency, also called venous stasis, is a condition that affects the veins in the legs. The condition prevents blood from being pumped through these veins effectively. Blood may no longer be pumped effectively from the legs back to the heart. This condition can range from mild to severe. With proper treatment, you should be able to continue with an active life. CAUSES  Chronic venous insufficiency occurs when the vein walls become stretched, weakened, or damaged or when valves within the vein are damaged. Some common causes of this include:  High blood pressure inside the veins (venous hypertension).  Increased blood pressure in the leg veins from long periods of sitting or standing.  A blood clot that blocks blood flow in a vein (deep vein thrombosis).  Inflammation of a superficial vein (phlebitis) that causes a blood clot to form. RISK FACTORS Various things can make you more likely to develop chronic venous insufficiency, including:  Family history of this condition.  Obesity.  Pregnancy.  Sedentary lifestyle.  Smoking.  Jobs requiring long periods of standing or sitting in one place.  Being a certain age. Women in their 61s and 62s and men in their 81s are more likely to develop this condition. SIGNS AND SYMPTOMS  Symptoms may include:   Varicose veins.  Skin breakdown or ulcers.  Reddened or discolored skin on the leg.  Brown, smooth, tight, and painful skin just above the ankle, usually on the inside surface (lipodermatosclerosis).  Swelling. DIAGNOSIS  To diagnose this condition, your health care provider will take a medical history and do a physical exam. The following tests may be ordered to confirm the diagnosis:  Duplex ultrasound--A procedure that produces a picture of a blood vessel and nearby organs and also provides information on blood flow through  the blood vessel.  Plethysmography--A procedure that tests blood flow.  A venogram, or venography--A procedure used to look at the veins using X-ray and dye. TREATMENT The goals of treatment are to help you return to an active life and to minimize pain or disability. Treatment will depend on the severity of the condition. Medical procedures may be needed for severe cases. Treatment options may include:   Use of compression stockings. These can help with symptoms and lower the chances of the problem getting worse, but they do not cure the problem.  Sclerotherapy--A procedure involving an injection of a material that "dissolves" the damaged veins. Other veins in the network of blood vessels take over the function of the damaged veins.  Surgery to remove the vein or cut off blood flow through the vein (vein stripping or laser ablation surgery).  Surgery to repair a valve. HOME CARE INSTRUCTIONS   Wear compression stockings as directed by your health care provider.  Only take over-the-counter or prescription medicines for pain, discomfort, or fever as directed by your health care provider.  Follow up with your health care provider as directed. SEEK MEDICAL CARE IF:   You have redness, swelling, or increasing pain in the affected area.  You see a red streak or line that extends up or down from the affected area.  You have a breakdown or loss of skin in the affected area, even if the breakdown is small.  You have an injury to the affected area. SEEK IMMEDIATE MEDICAL CARE IF:   You have an injury and open wound in the affected area.  Your  pain is severe and does not improve with medicine.  You have sudden numbness or weakness in the foot or ankle below the affected area, or you have trouble moving your foot or ankle.  You have a fever or persistent symptoms for more than 2-3 days.  You have a fever and your symptoms suddenly get worse. MAKE SURE YOU:   Understand these  instructions.  Will watch your condition.  Will get help right away if you are not doing well or get worse.   This information is not intended to replace advice given to you by your health care provider. Make sure you discuss any questions you have with your health care provider.   Document Released: 09/23/2006 Document Revised: 03/10/2013 Document Reviewed: 01/25/2013 Elsevier Interactive Patient Education 2016 [employer] Stasis Dermatitis Stasis dermatitis occurs when veins lose the ability to pump blood back to the heart (poor venous circulation). It causes a reddish-purple to brownish scaly, itchy rash on the legs. The rash comes from pooling of blood (stasis). CAUSES  This occurs because the veins do not work very well anymore or because pressure may be increased in the veins due to other conditions. With blood pooling, the increased pressure in the tiny blood vessels (capillaries) causes fluid to leak out of the capillaries into the tissue. The extra fluid makes it harder for the blood to feed the cells and get rid of waste products. SYMPTOMS  Stasis dermatitis appears as red, scaly, itchy patches on the legs. A yellowish or light brown discoloration is also present. Due to scratching or other injury, these patches can become an ulcer. This ulcer may remain for long periods of time. The ulcer can also become infected. Swelling of the legs is often present with stasis dermatitis. If the leg is swollen, this increases the risk of infection and further damage to the skin. Sometimes, intense itching, tingling, and burning occurs before signs of stasis dermatitis appear. You may find yourself scratching the insides of your ankles or rubbing your ankles together before the rash appears. After healing, there are often brown spots on the affected skin. DIAGNOSIS  Your caregiver makes this diagnosis based on an exam. Other tests may be done to better understand the cause. TREATMENT  If underlying  conditions are present, they must be treated. Some of these conditions are heart failure, thyroid problems, poor nutrition, and varicose veins.  Cortisone creams and ointments applied to the skin (topically) may be needed, as well as medicine to reduce swelling in the legs (diuretics).  Compression stockings or an elastic wrap may also be needed to reduce swelling.  If there is an infection, antibiotic medicines may also be used. HOME CARE INSTRUCTIONS   Try to rest and raise (elevate) the affected leg above the level of the heart, if possible.  Burow's solution wet packs applied for 30 minutes, 3 times daily, will help the weepy rash. Stop using the packs before your skin gets too dry. You can also use a mixture of 3 parts white vinegar to 1 quart water.  Grease your legs daily with ointments, such as petroleum jelly, to fight dryness.  Avoid scratching or injuring the area. SEEK IMMEDIATE MEDICAL CARE IF:   Your rash gets worse.  An ulcer forms.  You have an oral temperature above 102 F (38.9 C), not controlled by medicine.  You have any other severe symptoms.   This information is not intended to replace advice given to you by your health care provider.  Make sure you discuss any questions you have with your health care provider.   Document Released: 08/29/2005 Document Revised: 08/12/2011 Document Reviewed: 10/05/2014 Elsevier Interactive Patient Education 2016 [employer].  Compression knee highs

## 2016-03-13 NOTE — Assessment & Plan Note (Signed)
Reduce Norvasc dose due to edema

## 2016-03-14 ENCOUNTER — Ambulatory Visit: Payer: 59

## 2016-03-20 DIAGNOSIS — E1165 Type 2 diabetes mellitus with hyperglycemia: Secondary | ICD-10-CM | POA: Diagnosis not present

## 2016-03-20 DIAGNOSIS — I10 Essential (primary) hypertension: Secondary | ICD-10-CM | POA: Diagnosis not present

## 2016-03-20 DIAGNOSIS — E7800 Pure hypercholesterolemia, unspecified: Secondary | ICD-10-CM | POA: Diagnosis not present

## 2016-03-20 DIAGNOSIS — G609 Hereditary and idiopathic neuropathy, unspecified: Secondary | ICD-10-CM | POA: Diagnosis not present

## 2016-03-20 MED FILL — INVOKAMET XR 150-1,000 MG T: 150-1000 | 90 days supply | Qty: 180 | Fill #0

## 2016-03-27 MED FILL — LOSARTAN-HCTZ 100-25 MG TAB: 100-25 | 30 days supply | Qty: 30 | Fill #0

## 2016-04-08 MED FILL — BUPROPION HCL XL 150 MG TAB: 150 | 90 days supply | Qty: 90 | Fill #2

## 2016-04-10 MED FILL — CITALOPRAM HBR 10 MG TABLET: 10 | 90 days supply | Qty: 180 | Fill #3

## 2016-04-10 MED FILL — GLIMEPIRIDE 4 MG TABLET: 4 | 30 days supply | Qty: 60 | Fill #5

## 2016-04-15 MED FILL — ONETOUCH VERIO TEST STRIP: 30 days supply | Qty: 100 | Fill #1

## 2016-04-17 MED FILL — ATORVASTATIN 40 MG TABLET: 40 | 90 days supply | Qty: 90 | Fill #3

## 2016-04-18 ENCOUNTER — Other Ambulatory Visit: Payer: Self-pay

## 2016-04-18 VITALS — BP 128/78 | HR 104 | Resp 16 | Ht 72.0 in | Wt 300.8 lb

## 2016-04-18 DIAGNOSIS — E1165 Type 2 diabetes mellitus with hyperglycemia: Principal | ICD-10-CM

## 2016-04-18 DIAGNOSIS — IMO0002 Reserved for concepts with insufficient information to code with codable children: Secondary | ICD-10-CM

## 2016-04-18 DIAGNOSIS — E1140 Type 2 diabetes mellitus with diabetic neuropathy, unspecified: Secondary | ICD-10-CM

## 2016-04-18 NOTE — Patient Outreach (Signed)
[facility]) Care Management   04/18/2016  Victor Byrd 11/10/1965 [id number]  Victor Byrd is an 50 y.o. male.   Member seen for follow up office visit for Link to Wellness program for self management of Type 2 diabetes  Subjective: member states that Dr. Chalmers Cater put him on a new medication on his last visit in October.  States that his blood sugars have been better since he changed to the Invokamet.  States he saw his MD because he was having swelling in his legs and he gave him a fluid pill which has helped.  sTates that his legs are much better now.  States he has been trying to eat better but they still eat a lot of take out food.  States his only exercise is doing a lot of walking on his job at [employer].  Member states that his stress level is improved now that they have moved their pot belly pig out of the house.  Objective:   Review of Systems  Cardiovascular: Positive for leg swelling.  Musculoskeletal: Positive for joint pain.  All other systems reviewed and are negative. Reviewed glucometer 7 day average-133 14 day average-126 30 day average-130  Physical Exam Today's Vitals   04/18/16 1201 04/18/16 1205  BP: 128/78   Pulse: (!) 104   Resp: 16   SpO2: 96%   Weight: (!) 300 lb 12.8 oz (136.4 kg)   Height: 1.829 m (6')   PainSc: 0-No pain 0-No pain   Encounter Medications:   Outpatient Encounter Prescriptions as of 04/18/2016  Medication Sig Note  . albuterol (VENTOLIN HFA) 108 (90 BASE) MCG/ACT inhaler INHALE 2 PUFFS INTO THE LUNGS EVERY 6 HOURS AS NEEDED FOR WHEEZING.   Marland Kitchen amLODipine (NORVASC) 10 MG tablet Take 0.5 tablets (5 mg total) by mouth daily.   Marland Kitchen aspirin 81 MG tablet Take 81 mg by mouth daily.     Marland Kitchen atorvastatin (LIPITOR) 40 MG tablet TAKE 1 TABLET BY MOUTH ONCE DAILY   . buPROPion (WELLBUTRIN XL) 150 MG 24 hr tablet Take 1 tablet (150 mg total) by mouth daily. Must keep 06/28/15 appt for refills   . buPROPion (WELLBUTRIN XL) 150 MG 24 hr  tablet Take 1 tablet (150 mg total) by mouth daily.   . Canagliflozin-Metformin HCl (INVOKAMET) 9808031443 MG TABS Take 1 tablet by mouth 2 (two) times daily.   . Cholecalciferol (VITAMIN D3) 1000 UNITS tablet Take 1,000 Units by mouth daily.     . citalopram (CELEXA) 10 MG tablet Take 2 tablets (20 mg total) by mouth daily. Must keep 06/28/15 appt for refills   . diclofenac (VOLTAREN) 75 MG EC tablet TAKE 1 TABLET BY MOUTH TWICE DAILY   . furosemide (LASIX) 20 MG tablet Take 1-2 tablets (20-40 mg total) by mouth daily as needed.   Marland Kitchen LANTUS SOLOSTAR 100 UNIT/ML Solostar Pen 75 Units 2 (two) times daily. 75 units every morning, 75 units every evening 06/28/2015: Received from: External Pharmacy Received Sig:   . losartan-hydrochlorothiazide (HYZAAR) 100-25 MG tablet Take 1 tablet by mouth daily. 06/28/2015: Received from: External Pharmacy Received Sig:   . Multiple Vitamin (MULTIVITAMIN WITH MINERALS) TABS tablet Take 1 tablet by mouth daily.   . nortriptyline (PAMELOR) 25 MG capsule TAKE 1 TO 2 CAPSULES BY MOUTH AT BEDTIME   . sildenafil (VIAGRA) 100 MG tablet Take 1 tablet (100 mg total) by mouth as needed for erectile dysfunction.   Marland Kitchen glimepiride (AMARYL) 4 MG tablet Take 1 tablet by  mouth daily with breakfast.  06/28/2015: Received from: External Pharmacy Received Sig:   . metFORMIN (GLUCOPHAGE) 1000 MG tablet Take 1 tablet (1,000 mg total) by mouth 2 (two) times daily with a meal. (Patient not taking: Reported on 04/18/2016)   . nystatin (MYCOSTATIN) 100000 UNIT/ML suspension Take 5 mLs (500,000 Units total) by mouth 4 (four) times daily. (Patient not taking: Reported on 04/18/2016)   . triamcinolone cream (KENALOG) 0.5 % Apply 1 application topically 3 (three) times daily. (Patient not taking: Reported on 04/18/2016)   . valACYclovir (VALTREX) 1000 MG tablet Take 1 tablet (1,000 mg total) by mouth 2 (two) times daily. (Patient not taking: Reported on 04/18/2016)    No facility-administered encounter  medications on file as of 04/18/2016.     Functional Status:   In your present state of health, do you have any difficulty performing the following activities: 04/18/2016 11/30/2015  Hearing? N N  Vision? N N  Difficulty concentrating or making decisions? N N  Walking or climbing stairs? N N  Dressing or bathing? N N  Doing errands, shopping? N N  Some recent data might be hidden    Fall/Depression Screening:    PHQ 2/9 Scores 04/18/2016 11/30/2015  PHQ - 2 Score 0 1    Assessment:  Member seen for initial office visit for Link to Wellness program for self management of Type 2 diabetes.  Member is not meeting diabetes self management goal of hemoglobin A1C of 7% or below with last reported result of 8.8%.  Member is followed by endocrinologist and is to see her again on 07/25/16.  Member was started on Invokamet at his last endocrinology visit.  CBGs have been improved since starting medication and his B/P is 126/78 today.  Reports difficulty with adherence with diet and exercise.  [facility].  Reports being up to date with annual eye exam and dental check ups  Plan:  Plan to eat 60-75 GM (4-5) servings of carbohydrate a meal and 15 GM for snacks.  Plan to eat protein with your snacks Plan to check blood sugar daily either fasting or 1 -2hrs after a meal.  Goals of 80-130 fasting and 180 or less after eating. Plan to walk for 30 minutes 5 times a week Plan to enroll in the [facility] program Plan to see Dr. Chalmers Cater on 07/25/16 Plan to be followed through the [facility] program  [facility] CM Care Plan Problem One   Flowsheet Row Most Recent Value  Care Plan Problem One  Elevated blood sugar as evidenced by hemoglobin A1C of 9.5% related to dx of Type 2 DM  Role Documenting the Problem One  Care Management [place] for Problem One  Active  [facility] Long Term Goal (31-90 days)  Member will decrease hemoglobin A1C to 7% or below by the next  link to Wellness visit  [place] Start Date  04/18/16  Interventions for Problem One Long Term Goal  Reinforced  CHO counting and portion control , Instructed on use of Invokamet and possible side effects,  Reinforced on blood sugar goals and how they relate to his hemoglobin A1C,  Reinforced the importance of regular exercise to glycemic control, Given handout on the [facility] program and instructed on how to enroll in the program     [name] RN, [facility] Care Management Coordinator-Link to [place] Management [phone]

## 2016-04-18 NOTE — Patient Instructions (Signed)
1. Plan to eat 60-75 GM (4-5) servings of carbohydrate a meal and 15 GM for snacks.  Plan to eat protein with your snacks 2. Plan to check blood sugar daily either fasting or 1 -2hrs after a meal.  Goals of 80-130 fasting and 180 or less after eating. 3. Plan to walk for 30 minutes 5 times a week 4. Plan to enroll in the [facility] program 5. Plan to see Dr. Chalmers Cater on 07/25/16 6. Plan to be followed through the [facility] program

## 2016-04-24 MED FILL — LOSARTAN-HCTZ 100-25 MG TAB: 100-25 | 30 days supply | Qty: 30 | Fill #1

## 2016-04-26 MED FILL — VIAGRA 100 MG TABLET: 100 | 30 days supply | Qty: 6 | Fill #7

## 2016-05-16 MED FILL — CHLORHEXIDINE 0.12% RINSE: 0.12 | 16 days supply | Qty: 473 | Fill #0

## 2016-05-23 MED FILL — LANTUS SOLOSTAR 100 UNITS/M: 100 | 90 days supply | Qty: 135 | Fill #2

## 2016-05-27 MED FILL — LOSARTAN-HCTZ 100-25 MG TAB: 100-25 | 30 days supply | Qty: 30 | Fill #2

## 2016-05-28 ENCOUNTER — Other Ambulatory Visit: Payer: Self-pay | Admitting: Internal Medicine

## 2016-05-28 MED FILL — NORTRIPTYLINE HCL 25 MG CAP: 25 | 30 days supply | Qty: 60 | Fill #0

## 2016-06-06 ENCOUNTER — Telehealth: Payer: Self-pay | Admitting: Internal Medicine

## 2016-06-06 NOTE — Telephone Encounter (Signed)
Pt has a cpap machine that he was give back in 2014 from a plum dr that is no longer practicing .  He has not seen a pulmonary since 2014.  He is needs [facility] a piece of Velcro that goes on his cpap machine. He needs to know if Dr Camila Li can send this in for him or a nurse could call him about this?

## 2016-06-07 NOTE — Telephone Encounter (Signed)
Pt will need to make appt w/Dr. Plotnikov. Per chart he is due for his yearly physical. He must be re-evaluated for CPAP issues, and if need MD can refer him to see pulmonologist to assist w/getting new CPAP machine...Victor Byrd

## 2016-06-11 MED FILL — GLIMEPIRIDE 4 MG TABLET: 4 | 90 days supply | Qty: 180 | Fill #0

## 2016-06-11 MED FILL — INVOKAMET XR 150-1,000 MG T: 150-1000 | 90 days supply | Qty: 180 | Fill #1

## 2016-06-12 DIAGNOSIS — G4733 Obstructive sleep apnea (adult) (pediatric): Secondary | ICD-10-CM | POA: Diagnosis not present

## 2016-06-20 MED FILL — LOSARTAN-HCTZ 100-25 MG TAB: 100-25 | 90 days supply | Qty: 90 | Fill #3

## 2016-06-25 MED FILL — FLUCONAZOLE 150 MG TAB: 150 | 3 days supply | Qty: 3 | Fill #0

## 2016-06-27 ENCOUNTER — Other Ambulatory Visit: Payer: Self-pay | Admitting: Internal Medicine

## 2016-07-01 ENCOUNTER — Other Ambulatory Visit: Payer: Self-pay | Admitting: Internal Medicine

## 2016-07-01 MED FILL — SILDENAFIL 100 MG TABLET: 100 | 84 days supply | Qty: 12 | Fill #0

## 2016-07-03 MED FILL — NORTRIPTYLINE HCL 25 MG CAP: 25 | 30 days supply | Qty: 60 | Fill #0

## 2016-07-04 DIAGNOSIS — E119 Type 2 diabetes mellitus without complications: Secondary | ICD-10-CM | POA: Diagnosis not present

## 2016-07-04 DIAGNOSIS — H524 Presbyopia: Secondary | ICD-10-CM | POA: Diagnosis not present

## 2016-07-04 LAB — HM DIABETES EYE EXAM

## 2016-07-05 MED FILL — BUPROPION HCL XL 150 MG TAB: 150 | 90 days supply | Qty: 90 | Fill #3

## 2016-07-15 ENCOUNTER — Other Ambulatory Visit: Payer: Self-pay | Admitting: Internal Medicine

## 2016-07-15 MED FILL — ATORVASTATIN 40 MG TABLET: 40 | 30 days supply | Qty: 30 | Fill #0

## 2016-07-18 ENCOUNTER — Other Ambulatory Visit: Payer: Self-pay | Admitting: Internal Medicine

## 2016-07-18 ENCOUNTER — Encounter: Payer: Self-pay | Admitting: Internal Medicine

## 2016-07-18 ENCOUNTER — Other Ambulatory Visit: Payer: Self-pay | Admitting: *Deleted

## 2016-07-18 ENCOUNTER — Ambulatory Visit (INDEPENDENT_AMBULATORY_CARE_PROVIDER_SITE_OTHER): Payer: 59 | Admitting: Internal Medicine

## 2016-07-18 DIAGNOSIS — E1140 Type 2 diabetes mellitus with diabetic neuropathy, unspecified: Secondary | ICD-10-CM

## 2016-07-18 DIAGNOSIS — E6609 Other obesity due to excess calories: Secondary | ICD-10-CM | POA: Diagnosis not present

## 2016-07-18 DIAGNOSIS — Z6841 Body Mass Index (BMI) 40.0 and over, adult: Secondary | ICD-10-CM

## 2016-07-18 DIAGNOSIS — IMO0002 Reserved for concepts with insufficient information to code with codable children: Secondary | ICD-10-CM

## 2016-07-18 DIAGNOSIS — E1165 Type 2 diabetes mellitus with hyperglycemia: Secondary | ICD-10-CM | POA: Diagnosis not present

## 2016-07-18 DIAGNOSIS — B3742 Candidal balanitis: Secondary | ICD-10-CM | POA: Insufficient documentation

## 2016-07-18 DIAGNOSIS — IMO0001 Reserved for inherently not codable concepts without codable children: Secondary | ICD-10-CM

## 2016-07-18 MED ORDER — KETOCONAZOLE 200 MG PO TABS: 200.0000 mg | ORAL_TABLET | Freq: Every day | ORAL | 1 refills | Status: DC

## 2016-07-18 MED ORDER — KETOCONAZOLE 2 % EX CREA: 1.0000 "application " | TOPICAL_CREAM | Freq: Two times a day (BID) | CUTANEOUS | 1 refills | Status: AC

## 2016-07-18 MED FILL — KETOCONAZOLE 2% CREAM: 2 | 30 days supply | Qty: 45 | Fill #0

## 2016-07-18 MED FILL — CITALOPRAM HBR 10 MG TABLET: 10 | 90 days supply | Qty: 180 | Fill #0

## 2016-07-18 MED FILL — KETOCONAZOLE 200 MG TABLET: 200 | 14 days supply | Qty: 14 | Fill #0

## 2016-07-18 NOTE — Assessment & Plan Note (Signed)
Improve DM control to help to fight yeast balanitis

## 2016-07-18 NOTE — Assessment & Plan Note (Signed)
Discussed wt loss 

## 2016-07-18 NOTE — Patient Instructions (Addendum)
Hold atorvastatin x 10-14 days Athletic underwear

## 2016-07-18 NOTE — Progress Notes (Signed)
Subjective:  Patient ID: Victor Byrd, male    DOB: [birth year]  Age: 51 y.o. MRN: [id number]  CC: Rash (genital area, burning, itching, flakes, white/yellow color, 3 months comes and goes)   HPI Victor Byrd presents for DM; rash on glans penis - worsening.   Outpatient Medications Prior to Visit  Medication Sig Dispense Refill  . albuterol (VENTOLIN HFA) 108 (90 BASE) MCG/ACT inhaler INHALE 2 PUFFS INTO THE LUNGS EVERY 6 HOURS AS NEEDED FOR WHEEZING. 3 Inhaler 3  . amLODipine (NORVASC) 10 MG tablet Take 0.5 tablets (5 mg total) by mouth daily. 90 tablet 3  . aspirin 81 MG tablet Take 81 mg by mouth daily.      Marland Kitchen atorvastatin (LIPITOR) 40 MG tablet Take 1 tablet (40 mg total) by mouth daily. Yearly physical w/labs are due must see MD for refills 30 tablet 0  . buPROPion (WELLBUTRIN XL) 150 MG 24 hr tablet Take 1 tablet (150 mg total) by mouth daily. 90 tablet 3  . Canagliflozin-Metformin HCl (INVOKAMET) 210 164 1331 MG TABS Take 1 tablet by mouth 2 (two) times daily.    . Cholecalciferol (VITAMIN D3) 1000 UNITS tablet Take 1,000 Units by mouth daily.      . citalopram (CELEXA) 10 MG tablet Take 2 tablets (20 mg total) by mouth daily. Must keep 06/28/15 appt for refills 20 tablet 0  . diclofenac (VOLTAREN) 75 MG EC tablet TAKE 1 TABLET BY MOUTH TWICE DAILY 30 tablet 1  . furosemide (LASIX) 20 MG tablet Take 1-2 tablets (20-40 mg total) by mouth daily as needed. 60 tablet 2  . glimepiride (AMARYL) 4 MG tablet Take 1 tablet by mouth daily with breakfast.   6  . LANTUS SOLOSTAR 100 UNIT/ML Solostar Pen 75 Units 2 (two) times daily. 75 units every morning, 75 units every evening  3  . losartan-hydrochlorothiazide (HYZAAR) 100-25 MG tablet Take 1 tablet by mouth daily.  6  . Multiple Vitamin (MULTIVITAMIN WITH MINERALS) TABS tablet Take 1 tablet by mouth daily.    . nortriptyline (PAMELOR) 25 MG capsule TAKE 1 TO 2 CAPSULES BY MOUTH AT BEDTIME. *YEARLY PHYSICAL DUE IN [place]. MUST SEE MD FOR  REFILLS* 60 capsule 5  . sildenafil (VIAGRA) 100 MG tablet Take 1 tablet (100 mg total) by mouth as needed for erectile dysfunction. Yearly physical w/labs are due must see MD for refills 12 tablet 0  . buPROPion (WELLBUTRIN XL) 150 MG 24 hr tablet Take 1 tablet (150 mg total) by mouth daily. Must keep 06/28/15 appt for refills 10 tablet 0  . metFORMIN (GLUCOPHAGE) 1000 MG tablet Take 1 tablet (1,000 mg total) by mouth 2 (two) times daily with a meal. (Patient not taking: Reported on 04/18/2016) 180 tablet 3  . nystatin (MYCOSTATIN) 100000 UNIT/ML suspension Take 5 mLs (500,000 Units total) by mouth 4 (four) times daily. (Patient not taking: Reported on 04/18/2016) 60 mL 0  . triamcinolone cream (KENALOG) 0.5 % Apply 1 application topically 3 (three) times daily. (Patient not taking: Reported on 04/18/2016) 90 g 2  . valACYclovir (VALTREX) 1000 MG tablet Take 1 tablet (1,000 mg total) by mouth 2 (two) times daily. (Patient not taking: Reported on 04/18/2016) 4 tablet 0   No facility-administered medications prior to visit.     ROS Review of Systems  Constitutional: Negative for appetite change, fatigue and unexpected weight change.  HENT: Negative for congestion, nosebleeds, sneezing, sore throat and trouble swallowing.   Eyes: Negative for itching and visual disturbance.  Respiratory: Negative for cough.   Cardiovascular: Negative for chest pain, palpitations and leg swelling.  Gastrointestinal: Negative for abdominal distention, blood in stool, diarrhea and nausea.  Genitourinary: Negative for frequency and hematuria.  Musculoskeletal: Negative for back pain.  Skin: Positive for rash.  Neurological: Negative for dizziness, tremors, speech difficulty and weakness.  Psychiatric/Behavioral: Negative for agitation, dysphoric mood and sleep disturbance. The patient is nervous/anxious.     Objective:  BP (!) 146/88   Pulse (!) 104   Temp 99 F (37.2 C) (Oral)   Resp 16   Ht 6' (1.829 m)    Wt (!) 303 lb 4 oz (137.6 kg)   SpO2 96%   BMI 41.13 kg/m   BP Readings from Last 3 Encounters:  07/18/16 (!) 146/88  04/18/16 128/78  03/13/16 140/70    Wt Readings from Last 3 Encounters:  07/18/16 (!) 303 lb 4 oz (137.6 kg)  04/18/16 (!) 300 lb 12.8 oz (136.4 kg)  03/13/16 (!) 306 lb (138.8 kg)    Physical Exam  Constitutional: No distress.  Cardiovascular: Normal rate.  Exam reveals no gallop.   No murmur heard. Pulmonary/Chest: He has no wheezes. He has no rales.  Abdominal: He exhibits no distension. There is no guarding.  Musculoskeletal: He exhibits no tenderness.  Neurological: Coordination normal.  Skin: Rash noted.  Large abd/obese Balanitis is present  Lab Results  Component Value Date   WBC 10.9 (H) 08/31/2015   HGB 14.1 08/31/2015   HCT 41.4 08/31/2015   PLT 306.0 08/31/2015   GLUCOSE 170 (H) 03/13/2016   CHOL 127 06/29/2015   TRIG 114.0 06/29/2015   HDL 44.60 06/29/2015   LDLCALC 59 06/29/2015   ALT 48 06/29/2015   AST 28 06/29/2015   NA 140 03/13/2016   K 3.8 03/13/2016   CL 102 03/13/2016   CREATININE 0.81 03/13/2016   BUN 14 03/13/2016   CO2 30 03/13/2016   TSH 2.68 06/29/2015   PSA 0.69 06/29/2015   HGBA1C 8.8 (H) 03/13/2016    Dg Ugi W/kub  Result Date: 03/20/2011 *RADIOLOGY REPORT* Clinical Data:  Morbid obesity.  Pre-op evaluation for bariatric surgery. UPPER GI SERIES WITH KUB Technique:  After obtaining a scout radiograph a single-column upper GI series was performed using thin barium. Fluoroscopy time: 1.8 minutes Findings:  The scout radiograph shows a normal bowel gas pattern. There is no evidence of esophageal mass or stricture.  There is no evidence of hiatal hernia, and no gastroesophageal reflux was seen during the exam.  Esophageal motility is within normal limits. The stomach is normal in appearance.  There is no evidence of gastric masses or ulcers.  Duodenal bulb and sweep are normal in appearance. IMPRESSION: Negative UGI  series. Original Report Authenticated By: Marlaine Hind, M.D.   Assessment & Plan:   There are no diagnoses linked to this encounter. I have discontinued Mr. Shehata metFORMIN, nystatin, valACYclovir, and triamcinolone cream. I am also having him start on ketoconazole and ketoconazole. Additionally, I am having him maintain his aspirin, cholecalciferol, albuterol, citalopram, glimepiride, LANTUS SOLOSTAR, losartan-hydrochlorothiazide, buPROPion, diclofenac, amLODipine, furosemide, Canagliflozin-Metformin HCl, multivitamin with minerals, nortriptyline, sildenafil, atorvastatin, and INVOKAMET XR.  Meds ordered this encounter  Medications  . INVOKAMET XR [phone] MG TB24    Refill:  4  . ketoconazole (NIZORAL) 2 % cream    Sig: Apply 1 application topically 2 (two) times daily.    Dispense:  45 g    Refill:  1  . ketoconazole (NIZORAL) 200 MG  tablet    Sig: Take 1 tablet (200 mg total) by mouth daily.    Dispense:  14 tablet    Refill:  1     Follow-up: No Follow-up on file.  Walker Kehr, MD

## 2016-07-18 NOTE — Assessment & Plan Note (Signed)
Hold atorvastatin x 10-14 days Athletic underwear DM control/wt loss Urol ref if refractory Ketoconazole po/topical Rx

## 2016-07-18 NOTE — Telephone Encounter (Signed)
Rec'd call pt is wanting to know was his rx for citalopram was sent to pharmacy inform per chart MD sent to [place]...Victor Byrd

## 2016-07-22 MED FILL — FREESTYLE LITE TEST STRIP: 30 days supply | Qty: 100 | Fill #0

## 2016-07-22 MED FILL — FREESTYLE LANCETS: 30 days supply | Qty: 100 | Fill #0

## 2016-07-22 MED FILL — FREESTYLE LITE METER: 30 days supply | Qty: 1 | Fill #0

## 2016-07-24 ENCOUNTER — Encounter: Payer: Self-pay | Admitting: Internal Medicine

## 2016-07-24 NOTE — Progress Notes (Unsigned)
[facility] Abstracted

## 2016-07-26 MED FILL — AMLODIPINE BESYLATE 10 MG T: 10 | 90 days supply | Qty: 45 | Fill #0

## 2016-07-29 MED FILL — KETOCONAZOLE 200 MG TABLET: 200 | 14 days supply | Qty: 14 | Fill #1

## 2016-08-04 MED FILL — NORTRIPTYLINE HCL 25 MG CAP: 25 | 30 days supply | Qty: 60 | Fill #1

## 2016-08-05 ENCOUNTER — Other Ambulatory Visit: Payer: Self-pay | Admitting: Internal Medicine

## 2016-08-12 MED FILL — ATORVASTATIN 40 MG TABLET: 40 | 30 days supply | Qty: 30 | Fill #0

## 2016-08-22 MED FILL — LANTUS SOLOSTAR 100 UNITS/M: 100 | 90 days supply | Qty: 135 | Fill #0

## 2016-08-28 DIAGNOSIS — G609 Hereditary and idiopathic neuropathy, unspecified: Secondary | ICD-10-CM | POA: Diagnosis not present

## 2016-08-28 DIAGNOSIS — E7800 Pure hypercholesterolemia, unspecified: Secondary | ICD-10-CM | POA: Diagnosis not present

## 2016-08-28 DIAGNOSIS — E1165 Type 2 diabetes mellitus with hyperglycemia: Secondary | ICD-10-CM | POA: Diagnosis not present

## 2016-08-28 DIAGNOSIS — I10 Essential (primary) hypertension: Secondary | ICD-10-CM | POA: Diagnosis not present

## 2016-08-29 DIAGNOSIS — R809 Proteinuria, unspecified: Secondary | ICD-10-CM | POA: Diagnosis not present

## 2016-08-29 DIAGNOSIS — G609 Hereditary and idiopathic neuropathy, unspecified: Secondary | ICD-10-CM | POA: Diagnosis not present

## 2016-08-29 DIAGNOSIS — E7800 Pure hypercholesterolemia, unspecified: Secondary | ICD-10-CM | POA: Diagnosis not present

## 2016-08-29 DIAGNOSIS — E1165 Type 2 diabetes mellitus with hyperglycemia: Secondary | ICD-10-CM | POA: Diagnosis not present

## 2016-08-29 DIAGNOSIS — R945 Abnormal results of liver function studies: Secondary | ICD-10-CM | POA: Diagnosis not present

## 2016-08-30 ENCOUNTER — Other Ambulatory Visit: Payer: Self-pay | Admitting: Internal Medicine

## 2016-09-04 MED FILL — ACCU-CHEK GUIDE TEST STRIP: 30 days supply | Qty: 100 | Fill #0

## 2016-09-04 MED FILL — ACCU-CHEK FASTCLIX LANCETS: 30 days supply | Qty: 102 | Fill #0

## 2016-09-04 MED FILL — NORTRIPTYLINE HCL 25 MG CAP: 25 | 30 days supply | Qty: 60 | Fill #2

## 2016-09-10 ENCOUNTER — Telehealth: Payer: Self-pay

## 2016-09-10 MED FILL — KETOCONAZOLE 200 MG TABLET: 200 | 14 days supply | Qty: 14 | Fill #0

## 2016-09-10 NOTE — Telephone Encounter (Signed)
Aaron Edelman with [facility] outpt pharm called to ask about med usage with lipitor and nizoral---taking consecutively over a long period of time increases chance of rhabdo---per dr Alain Marion, patient was advised to stop lipitor while on nizoral---I have advised brian- pharmacy to stop refills on lipitor for now until nizoral is completed

## 2016-09-12 ENCOUNTER — Other Ambulatory Visit: Payer: Self-pay | Admitting: Internal Medicine

## 2016-09-12 MED FILL — ATORVASTATIN 40 MG TABLET: 40 | 30 days supply | Qty: 30 | Fill #1

## 2016-09-12 MED FILL — FUROSEMIDE 20 MG TABLET: 20 | 30 days supply | Qty: 60 | Fill #0

## 2016-09-12 MED FILL — INVOKAMET XR 150-1,000 MG T: 150-1000 | 90 days supply | Qty: 180 | Fill #2

## 2016-09-20 MED FILL — LOSARTAN-HCTZ 100-25 MG TAB: 100-25 | 30 days supply | Qty: 30 | Fill #0

## 2016-09-23 MED FILL — FLUCONAZOLE 150 MG TABLET: 150 | 3 days supply | Qty: 3 | Fill #0

## 2016-10-07 MED FILL — ATORVASTATIN 40 MG TABLET: 40 | 30 days supply | Qty: 30 | Fill #2

## 2016-10-07 MED FILL — NORTRIPTYLINE HCL 25 MG CAP: 25 | 30 days supply | Qty: 60 | Fill #3

## 2016-10-14 ENCOUNTER — Other Ambulatory Visit: Payer: Self-pay | Admitting: Internal Medicine

## 2016-10-16 MED FILL — BUPROPION XL 150 MG TAB: 150 | 90 days supply | Qty: 90 | Fill #0

## 2016-10-17 MED FILL — GLIMEPIRIDE 4 MG TABLET: 4 | 90 days supply | Qty: 180 | Fill #1

## 2016-10-22 MED FILL — LOSARTAN-HCTZ 100-25 MG TAB: 100-25 | 30 days supply | Qty: 30 | Fill #1

## 2016-10-22 MED FILL — CITALOPRAM HBR 10 MG TABLET: 10 | 90 days supply | Qty: 180 | Fill #1

## 2016-10-23 ENCOUNTER — Other Ambulatory Visit: Payer: Self-pay | Admitting: Internal Medicine

## 2016-10-23 MED FILL — SILDENAFIL 100 MG TABLET: 100 | 60 days supply | Qty: 12 | Fill #0

## 2016-10-29 MED FILL — FLUCONAZOLE 150 MG TABLET: 150 | 3 days supply | Qty: 3 | Fill #1

## 2016-11-06 MED FILL — FUROSEMIDE 20 MG TABLET: 20 | 30 days supply | Qty: 60 | Fill #1

## 2016-11-06 MED FILL — NORTRIPTYLINE HCL 25 MG CAP: 25 | 30 days supply | Qty: 60 | Fill #4

## 2016-11-06 MED FILL — ATORVASTATIN 40 MG TABLET: 40 | 30 days supply | Qty: 30 | Fill #3

## 2016-11-22 MED FILL — LOSARTAN-HCTZ 100-25 MG TAB: 100-25 | 90 days supply | Qty: 90 | Fill #2

## 2016-11-22 MED FILL — AMLODIPINE BESYLATE 10 MG T: 10 | 90 days supply | Qty: 45 | Fill #1

## 2016-11-29 MED FILL — LANTUS SOLOSTAR 100 UNITS/M: 100 | 84 days supply | Qty: 90 | Fill #1

## 2016-12-05 MED FILL — ACCU-CHEK GUIDE TEST STRIP: 30 days supply | Qty: 100 | Fill #1

## 2016-12-07 MED FILL — NORTRIPTYLINE HCL 25 MG CAP: 25 | 30 days supply | Qty: 60 | Fill #5

## 2016-12-09 MED FILL — INVOKAMET XR 150-1,000 MG T: 150-1000 | 90 days supply | Qty: 180 | Fill #3

## 2016-12-16 ENCOUNTER — Telehealth: Payer: Self-pay | Admitting: Internal Medicine

## 2016-12-16 NOTE — Telephone Encounter (Signed)
Patient scheduled for Wednesday at 415

## 2016-12-16 NOTE — Telephone Encounter (Signed)
Pt states he is having a reaction to buPROPion (WELLBUTRIN XL) 150 MG 24 hr tablet States ringing in the ears and other issues he could not discuss because he was in  A public place when he called. He would like a call back

## 2016-12-18 ENCOUNTER — Ambulatory Visit (INDEPENDENT_AMBULATORY_CARE_PROVIDER_SITE_OTHER): Payer: 59 | Admitting: Internal Medicine

## 2016-12-18 ENCOUNTER — Encounter: Payer: Self-pay | Admitting: Internal Medicine

## 2016-12-18 DIAGNOSIS — H9313 Tinnitus, bilateral: Secondary | ICD-10-CM | POA: Diagnosis not present

## 2016-12-18 DIAGNOSIS — E291 Testicular hypofunction: Secondary | ICD-10-CM | POA: Insufficient documentation

## 2016-12-18 DIAGNOSIS — R6882 Decreased libido: Secondary | ICD-10-CM | POA: Diagnosis not present

## 2016-12-18 DIAGNOSIS — H9319 Tinnitus, unspecified ear: Secondary | ICD-10-CM | POA: Insufficient documentation

## 2016-12-18 DIAGNOSIS — Z6841 Body Mass Index (BMI) 40.0 and over, adult: Secondary | ICD-10-CM | POA: Diagnosis not present

## 2016-12-18 NOTE — Progress Notes (Signed)
Subjective:  Patient ID: Victor Byrd, male    DOB: [birth year]  Age: 51 y.o. MRN: [id number]  CC: No chief complaint on file.   HPI Victor Byrd presents for ringing in the ears x 6 months He thinks it is due to Wellbutrin...  Outpatient Medications Prior to Visit  Medication Sig Dispense Refill  . albuterol (VENTOLIN HFA) 108 (90 BASE) MCG/ACT inhaler INHALE 2 PUFFS INTO THE LUNGS EVERY 6 HOURS AS NEEDED FOR WHEEZING. 3 Inhaler 3  . amLODipine (NORVASC) 10 MG tablet Take 0.5 tablets (5 mg total) by mouth daily. 90 tablet 3  . aspirin 81 MG tablet Take 81 mg by mouth daily.      Marland Kitchen atorvastatin (LIPITOR) 40 MG tablet Take 1 tablet (40 mg total) by mouth daily. 30 tablet 5  . buPROPion (WELLBUTRIN XL) 150 MG 24 hr tablet TAKE 1 TABLET BY MOUTH DAILY. 90 tablet 3  . Cholecalciferol (VITAMIN D3) 1000 UNITS tablet Take 1,000 Units by mouth daily.      . citalopram (CELEXA) 10 MG tablet TAKE 2 TABLETS BY MOUTH DAILY 180 tablet 3  . diclofenac (VOLTAREN) 75 MG EC tablet TAKE 1 TABLET BY MOUTH TWICE DAILY 30 tablet 1  . furosemide (LASIX) 20 MG tablet TAKE 1-2 TABLETS BY MOUTH DAILY AS NEEDED. 60 tablet 2  . glimepiride (AMARYL) 4 MG tablet Take 1 tablet by mouth daily with breakfast.   6  . INVOKAMET XR [phone] MG TB24   4  . ketoconazole (NIZORAL) 200 MG tablet TAKE 1 TABLET BY MOUTH ONCE DAILY 14 tablet 1  . LANTUS SOLOSTAR 100 UNIT/ML Solostar Pen 75 Units 2 (two) times daily. 75 units every morning, 75 units every evening  3  . losartan-hydrochlorothiazide (HYZAAR) 100-25 MG tablet Take 1 tablet by mouth daily.  6  . Multiple Vitamin (MULTIVITAMIN WITH MINERALS) TABS tablet Take 1 tablet by mouth daily.    . nortriptyline (PAMELOR) 25 MG capsule TAKE 1 TO 2 CAPSULES BY MOUTH AT BEDTIME. *YEARLY PHYSICAL DUE IN [place]. MUST SEE MD FOR REFILLS* 60 capsule 5  . sildenafil (VIAGRA) 100 MG tablet Take 1 tablet (100 mg total) by mouth as needed for erectile dysfunction. 12 tablet 0  .  Canagliflozin-Metformin HCl (INVOKAMET) [phone] MG TABS Take 1 tablet by mouth 2 (two) times daily.    . citalopram (CELEXA) 10 MG tablet Take 2 tablets (20 mg total) by mouth daily. Must keep 06/28/15 appt for refills 20 tablet 0   No facility-administered medications prior to visit.     ROS Review of Systems  Constitutional: Positive for fatigue. Negative for appetite change and unexpected weight change.  HENT: Positive for hearing loss and tinnitus. Negative for congestion, nosebleeds, sneezing, sore throat and trouble swallowing.   Eyes: Negative for itching and visual disturbance.  Respiratory: Negative for cough.   Cardiovascular: Negative for chest pain, palpitations and leg swelling.  Gastrointestinal: Negative for abdominal distention, blood in stool, diarrhea and nausea.  Genitourinary: Negative for frequency and hematuria.  Musculoskeletal: Negative for back pain, gait problem, joint swelling and neck pain.  Skin: Negative for rash.  Neurological: Negative for dizziness, tremors, speech difficulty and weakness.  Psychiatric/Behavioral: Negative for agitation, dysphoric mood and sleep disturbance. The patient is not nervous/anxious.     Objective:  BP (!) 160/90 (BP Location: Left Arm, Patient Position: Sitting, Cuff Size: Large)   Pulse (!) 108   Temp 98.7 F (37.1 C) (Oral)   Ht 6' (1.829 m)  Wt (!) 308 lb (139.7 kg)   SpO2 98%   BMI 41.77 kg/m   BP Readings from Last 3 Encounters:  12/18/16 (!) 160/90  07/18/16 (!) 146/88  04/18/16 128/78    Wt Readings from Last 3 Encounters:  12/18/16 (!) 308 lb (139.7 kg)  07/18/16 (!) 303 lb 4 oz (137.6 kg)  04/18/16 (!) 300 lb 12.8 oz (136.4 kg)    Physical Exam  Constitutional: He is oriented to person, place, and time. He appears well-developed. No distress.  NAD  HENT:  Mouth/Throat: Oropharynx is clear and moist.  Eyes: Pupils are equal, round, and reactive to light. Conjunctivae are normal.  Neck: Normal range  of motion. No JVD present. No thyromegaly present.  Cardiovascular: Normal rate, regular rhythm, normal heart sounds and intact distal pulses.  Exam reveals no gallop and no friction rub.   No murmur heard. Pulmonary/Chest: Effort normal and breath sounds normal. No respiratory distress. He has no wheezes. He has no rales. He exhibits no tenderness.  Abdominal: Soft. Bowel sounds are normal. He exhibits no distension and no mass. There is no tenderness. There is no rebound and no guarding.  Musculoskeletal: Normal range of motion. He exhibits no edema or tenderness.  Lymphadenopathy:    He has no cervical adenopathy.  Neurological: He is alert and oriented to person, place, and time. He has normal reflexes. No cranial nerve deficit. He exhibits normal muscle tone. He displays a negative Romberg sign. Coordination and gait normal.  Skin: Skin is warm and dry. No rash noted.  Psychiatric: He has a normal mood and affect. His behavior is normal. Judgment and thought content normal.  Obese  Lab Results  Component Value Date   WBC 10.9 (H) 08/31/2015   HGB 14.1 08/31/2015   HCT 41.4 08/31/2015   PLT 306.0 08/31/2015   GLUCOSE 170 (H) 03/13/2016   CHOL 127 06/29/2015   TRIG 114.0 06/29/2015   HDL 44.60 06/29/2015   LDLCALC 59 06/29/2015   ALT 48 06/29/2015   AST 28 06/29/2015   NA 140 03/13/2016   K 3.8 03/13/2016   CL 102 03/13/2016   CREATININE 0.81 03/13/2016   BUN 14 03/13/2016   CO2 30 03/13/2016   TSH 2.68 06/29/2015   PSA 0.69 06/29/2015   HGBA1C 8.8 (H) 03/13/2016    Dg Ugi W/kub  Result Date: 03/20/2011 *RADIOLOGY REPORT* Clinical Data:  Morbid obesity.  Pre-op evaluation for bariatric surgery. UPPER GI SERIES WITH KUB Technique:  After obtaining a scout radiograph a single-column upper GI series was performed using thin barium. Fluoroscopy time: 1.8 minutes Findings:  The scout radiograph shows a normal bowel gas pattern. There is no evidence of esophageal mass or  stricture.  There is no evidence of hiatal hernia, and no gastroesophageal reflux was seen during the exam.  Esophageal motility is within normal limits. The stomach is normal in appearance.  There is no evidence of gastric masses or ulcers.  Duodenal bulb and sweep are normal in appearance. IMPRESSION: Negative UGI series. Original Report Authenticated By: Marlaine Hind, M.D.   Assessment & Plan:   There are no diagnoses linked to this encounter. I have discontinued Mr. Cangelosi Canagliflozin-Metformin HCl. I am also having him maintain his aspirin, cholecalciferol, albuterol, glimepiride, LANTUS SOLOSTAR, losartan-hydrochlorothiazide, diclofenac, amLODipine, multivitamin with minerals, nortriptyline, INVOKAMET XR, citalopram, atorvastatin, ketoconazole, furosemide, buPROPion, and sildenafil.  No orders of the defined types were placed in this encounter.    Follow-up: No Follow-up on file.  Alex  Plotnikov, MD

## 2016-12-18 NOTE — Assessment & Plan Note (Signed)
ENT ref discussed Taper off Celexa May need to d/c Wellbutrin

## 2016-12-18 NOTE — Patient Instructions (Signed)
L- arginine for libido  Taper off and stop Celexa  There are natural ways to boost your testosterone:  1. Lose Weight If you're overweight, shedding the excess pounds may increase your testosterone levels, according to multiple research. Overweight men are more likely to have low testosterone levels to begin with, so this is an important trick to increase your body's testosterone production when you need it most.   2. Strength Training    Strength training is also known to boost testosterone levels, provided you are doing so intensely enough. When strength training to boost testosterone, you'll want to increase the weight and lower your number of reps, and then focus on exercises that work a large number of muscles.  3. Optimize Your Vitamin D Levels Vitamin D, a steroid hormone, is essential for the healthy development of the nucleus of the sperm cell, and helps maintain semen quality and sperm count. Vitamin D also increases levels of testosterone, which may boost libido. In one study, overweight men who were given vitamin D supplements had a significant increase in testosterone levels after one year.  4. Reduce Stress When you're under a lot of stress, your body releases high levels of the stress hormone cortisol. This hormone actually blocks the effects of testosterone, presumably because, from a biological standpoint, testosterone-associated behaviors (mating, competing, aggression) may have lowered your chances of survival in an emergency (hence, the "fight or flight" response is dominant, courtesy of cortisol).  5. Limit or Eliminate Sugar from Your Diet Testosterone levels decrease after you eat sugar, which is likely because the sugar leads to a high insulin level, another factor leading to low testosterone.  6. Eat Healthy Fats By healthy, this means not only mon- and polyunsaturated fats, like that found in avocadoes and nuts, but also saturated, as these are essential for building  testosterone. Research shows that a diet with less than 40 percent of energy as fat (and that mainly from animal sources, i.e. saturated) lead to a decrease in testosterone levels.  It's important to understand that your body requires saturated fats from animal and vegetable sources (such as meat, dairy, certain oils, and tropical plants like coconut) for optimal functioning, and if you neglect this important food group in favor of sugar, grains and other starchy carbs, your health and weight are almost guaranteed to suffer. Examples of healthy fats you can eat more of to give your testosterone levels a boost include:  Olives and Olive oil  Coconuts and coconut oil Butter made from organic milk  Raw nuts, such as, almonds or pecans Eggs Avocados   Meats Palm oil Unheated organic nut oils   7. "Testosterone boosters" containing Vitamin D-3, Niacin, Vitamin B-6, Vitamin B-12, Magnesium, Zinc, Selenium, D-Aspartic Acid, Fenugreed Seed Extract, Oystershell, [place] Extract, [place] Ginseng may be helpful as well.

## 2016-12-18 NOTE — Assessment & Plan Note (Signed)
Wt Readings from Last 3 Encounters:  12/18/16 (!) 308 lb (139.7 kg)  07/18/16 (!) 303 lb 4 oz (137.6 kg)  04/18/16 (!) 300 lb 12.8 oz (136.4 kg)

## 2016-12-18 NOTE — Assessment & Plan Note (Signed)
D/c Celexa, L arginine for libido Boost testosterone

## 2016-12-18 NOTE — Assessment & Plan Note (Addendum)
L arginine for libido See below

## 2017-01-09 ENCOUNTER — Other Ambulatory Visit: Payer: Self-pay | Admitting: Internal Medicine

## 2017-01-09 MED FILL — FUROSEMIDE 20 MG TAB: 20 | 30 days supply | Qty: 60 | Fill #2

## 2017-01-09 MED FILL — buPROPion HCL ER (XL) 150 M: 150 | 90 days supply | Qty: 90 | Fill #1

## 2017-01-10 MED FILL — NORTRIPTYLINE HCL 25 MG CAP: 25 | 30 days supply | Qty: 60 | Fill #0

## 2017-01-18 MED FILL — ACCU-CHEK GUIDE TEST STRIP: 30 days supply | Qty: 100 | Fill #2

## 2017-01-30 ENCOUNTER — Other Ambulatory Visit: Payer: Self-pay | Admitting: Internal Medicine

## 2017-01-31 MED FILL — SILDENAFIL CITRATE 100 MG T: 100 | 60 days supply | Qty: 12 | Fill #0

## 2017-02-04 MED FILL — LANTUS SOLOSTAR 100 UNITS/M: 100 | 84 days supply | Qty: 90 | Fill #2

## 2017-02-04 MED FILL — GLIMEPIRIDE 4 MG TABLET: 4 | 90 days supply | Qty: 180 | Fill #2

## 2017-02-10 MED FILL — NORTRIPTYLINE HCL 25 MG CAP: 25 | 30 days supply | Qty: 60 | Fill #1

## 2017-02-10 MED FILL — CITALOPRAM HBR 10 MG TABLET: 10 | 90 days supply | Qty: 180 | Fill #2

## 2017-02-18 MED FILL — LOSARTAN-HCTZ 100-25 MG TAB: 100-25 | 30 days supply | Qty: 30 | Fill #3

## 2017-02-18 MED FILL — AMLODIPINE BESYLATE 10 MG T: 10 | 90 days supply | Qty: 45 | Fill #2

## 2017-02-27 DIAGNOSIS — Z6841 Body Mass Index (BMI) 40.0 and over, adult: Secondary | ICD-10-CM | POA: Diagnosis not present

## 2017-02-27 DIAGNOSIS — G4733 Obstructive sleep apnea (adult) (pediatric): Secondary | ICD-10-CM | POA: Diagnosis not present

## 2017-02-27 DIAGNOSIS — H9113 Presbycusis, bilateral: Secondary | ICD-10-CM | POA: Diagnosis not present

## 2017-02-27 DIAGNOSIS — H9313 Tinnitus, bilateral: Secondary | ICD-10-CM | POA: Diagnosis not present

## 2017-03-06 MED FILL — INVOKAMET XR 150-1,000 MG T: 150-1000 | 90 days supply | Qty: 180 | Fill #4

## 2017-03-07 ENCOUNTER — Other Ambulatory Visit: Payer: Self-pay | Admitting: Internal Medicine

## 2017-03-07 MED FILL — NORTRIPTYLINE HCL 25 MG CAP: 25 | 30 days supply | Qty: 60 | Fill #2

## 2017-03-07 MED FILL — FUROSEMIDE 20 MG TAB: 20 | 90 days supply | Qty: 180 | Fill #0

## 2017-03-24 MED FILL — ACCU-CHEK GUIDE TEST STRIP: 30 days supply | Qty: 100 | Fill #3

## 2017-03-24 MED FILL — LOSARTAN-HCTZ 100-25 MG TAB: 100-25 | 30 days supply | Qty: 30 | Fill #0

## 2017-04-03 ENCOUNTER — Other Ambulatory Visit: Payer: Self-pay | Admitting: Internal Medicine

## 2017-04-03 MED FILL — NORTRIPTYLINE HCL 25 MG CAP: 25 | 30 days supply | Qty: 60 | Fill #3

## 2017-04-04 ENCOUNTER — Telehealth: Payer: Self-pay | Admitting: Internal Medicine

## 2017-04-04 NOTE — Telephone Encounter (Signed)
sildenafil (VIAGRA) 100 MG tablet  [place], [place] - [place] [phone] (Phone) [phone] (Fax)    Patient is requesting a refill on this medication. He states he needs it before the weekend. He would like a call if this can be done. I have  Informed we have a 24-72 hour turn around.

## 2017-04-07 MED FILL — SILDENAFIL CITRATE 100 MG T: 100 | 60 days supply | Qty: 12 | Fill #0

## 2017-04-07 MED FILL — BUPROPION HCL XL 150 MG TAB: 150 | 90 days supply | Qty: 90 | Fill #2

## 2017-04-07 NOTE — Telephone Encounter (Signed)
RX sent 04/05/17 by Redmond Baseman

## 2017-04-12 MED FILL — LANTUS SOLOSTAR 100 UNITS/M: 100 | 84 days supply | Qty: 90 | Fill #3

## 2017-04-22 ENCOUNTER — Ambulatory Visit: Payer: 59

## 2017-04-23 ENCOUNTER — Other Ambulatory Visit: Payer: Self-pay | Admitting: Internal Medicine

## 2017-04-29 ENCOUNTER — Ambulatory Visit (INDEPENDENT_AMBULATORY_CARE_PROVIDER_SITE_OTHER): Payer: 59

## 2017-04-29 DIAGNOSIS — Z23 Encounter for immunization: Secondary | ICD-10-CM | POA: Diagnosis not present

## 2017-05-01 MED FILL — GLIMEPIRIDE 4 MG TABLET: 4 | 90 days supply | Qty: 180 | Fill #3

## 2017-05-02 ENCOUNTER — Other Ambulatory Visit: Payer: Self-pay

## 2017-05-02 NOTE — Patient Outreach (Signed)
[facility]) Care Management  05/02/2017  Victor Byrd Jul 30, 1965 [id number]   Case closed in [place].  Member is being followed by the [employer] program Member enrolled in an external program [name] RN, [facility] Care Management Coordinator-Link to [place] Management [phone]

## 2017-05-08 ENCOUNTER — Encounter: Payer: Self-pay | Admitting: Internal Medicine

## 2017-05-08 ENCOUNTER — Ambulatory Visit (INDEPENDENT_AMBULATORY_CARE_PROVIDER_SITE_OTHER): Payer: 59 | Admitting: Internal Medicine

## 2017-05-08 DIAGNOSIS — H04301 Unspecified dacryocystitis of right lacrimal passage: Secondary | ICD-10-CM | POA: Insufficient documentation

## 2017-05-08 MED ORDER — ERYTHROMYCIN 5 MG/GM OP OINT: 1.0000 "application " | TOPICAL_OINTMENT | Freq: Every day | OPHTHALMIC | 0 refills | Status: DC

## 2017-05-08 MED FILL — AMLODIPINE BESYLATE 10 MG T: 10 | 90 days supply | Qty: 45 | Fill #0

## 2017-05-08 MED FILL — ERYTHROMYCIN EYE OINTMENT: 5 | 10 days supply | Qty: 4 | Fill #0

## 2017-05-08 NOTE — Assessment & Plan Note (Signed)
Erythromycin ointment for 3-5 days, advised to abstain from contacts until cleared and warm compress for pain and crusting.

## 2017-05-08 NOTE — Progress Notes (Signed)
   Subjective:    Patient ID: Victor Byrd, male    DOB: 1965/08/21, 51 y.o.   MRN: [id number]  HPI The patient is a 51 YO man coming in for possible right eye infection. Started about 2 weeks ago and is draining out some fluid during the day. Gets crusted at n times and he has to wipe down with warm washcloth. He feels it is stable. Does wear contacts but has not since this started. Denies fevers or chills. No sick contacts. Feels like his tear duct is blocked. He is currently out of blood pressure medication and has been trying to get the refills from the pharmacy and they will not let him. Denies headaches or chest pains.   Review of Systems  Constitutional: Negative.   HENT: Negative.   Eyes: Positive for discharge. Negative for photophobia, pain, redness, itching and visual disturbance.  Respiratory: Negative.   Cardiovascular: Negative.   Gastrointestinal: Negative.   Musculoskeletal: Negative.   Skin: Negative.       Objective:   Physical Exam  Constitutional: He appears well-developed and well-nourished.  HENT:  Head: Normocephalic and atraumatic.  Some redness of the conjunctivae, some fullness around the tear duct but able to express tears  Eyes: EOM are normal.  Neck: Normal range of motion.  Cardiovascular: Normal rate and regular rhythm.  Pulmonary/Chest: Effort normal. No respiratory distress. He has no wheezes. He has no rales.  Abdominal: Soft.  Skin: Skin is warm and dry.   Vitals:   05/08/17 1304  BP: (!) 180/100  Pulse: 88  Temp: 98.5 F (36.9 C)  TempSrc: Oral  SpO2: 98%  Weight: (!) 307 lb (139.3 kg)  Height: 6' (1.829 m)      Assessment & Plan:

## 2017-05-08 NOTE — Patient Instructions (Signed)
We have sent in eye ointment to use on the right eye for 3 days. Use a thin ribbon at night time and let sit for at least 10 minutes. It can cause blurred vision for up to 1 hour after using.  Make sure to get the blood pressure medicine as keeping good care of your blood pressure can decrease your risk of heart attack and stroke.

## 2017-05-09 MED FILL — NORTRIPTYLINE HCL 25 MG CAP: 25 | 30 days supply | Qty: 60 | Fill #4

## 2017-05-17 MED FILL — CITALOPRAM HBR 10 MG TABLET: 10 | 90 days supply | Qty: 180 | Fill #3

## 2017-05-26 MED FILL — LOSARTAN-HCTZ 100-25 MG TAB: 100-25 | 30 days supply | Qty: 30 | Fill #1

## 2017-06-06 ENCOUNTER — Other Ambulatory Visit: Payer: Self-pay | Admitting: Internal Medicine

## 2017-06-06 MED FILL — SILDENAFIL CITRATE 100 MG T: 100 | 60 days supply | Qty: 12 | Fill #0

## 2017-06-09 MED FILL — ACCU-CHEK GUIDE TEST STRIP: 30 days supply | Qty: 100 | Fill #4

## 2017-06-09 MED FILL — NORTRIPTYLINE HCL 25 MG CAP: 25 | 30 days supply | Qty: 60 | Fill #5

## 2017-06-09 MED FILL — INVOKAMET XR 150-1,000 MG T: 150-1000 | 90 days supply | Qty: 180 | Fill #0

## 2017-06-17 MED FILL — LOSARTAN-HCTZ 100-25 MG TAB: 100-25 | 30 days supply | Qty: 30 | Fill #2

## 2017-06-24 MED FILL — LANTUS SOLOSTAR 100 UNITS/M: 100 | 84 days supply | Qty: 90 | Fill #4

## 2017-06-26 DIAGNOSIS — M79642 Pain in left hand: Secondary | ICD-10-CM | POA: Insufficient documentation

## 2017-06-26 DIAGNOSIS — G5601 Carpal tunnel syndrome, right upper limb: Secondary | ICD-10-CM | POA: Insufficient documentation

## 2017-06-30 ENCOUNTER — Telehealth: Payer: Self-pay | Admitting: Internal Medicine

## 2017-06-30 DIAGNOSIS — M79641 Pain in right hand: Secondary | ICD-10-CM

## 2017-06-30 DIAGNOSIS — M79642 Pain in left hand: Principal | ICD-10-CM

## 2017-06-30 NOTE — Telephone Encounter (Signed)
Copied from [place] [id number]. Topic: Quick Communication - See Telephone Encounter >> Jun 30, 2017  4:40 PM Cleaster Corin, [place] wrote: CRM for notification. See Telephone encounter for:   06/30/17. Pt. Needed to have Dr. Alain Marion to resend request for test [facility] with EMG CODE: [id number] and MCV: [place] [phone] ext. [place]

## 2017-07-01 NOTE — Telephone Encounter (Signed)
Referral created.

## 2017-07-09 ENCOUNTER — Other Ambulatory Visit: Payer: Self-pay | Admitting: Internal Medicine

## 2017-07-09 MED FILL — buPROPion HCL ER (XL) 150 M: 150 | 90 days supply | Qty: 90 | Fill #3

## 2017-07-09 MED FILL — FUROSEMIDE 20 MG TABS: 20 | 90 days supply | Qty: 180 | Fill #1

## 2017-07-09 MED FILL — NORTRIPTYLINE HCL 25 MG CAP: 25 | 30 days supply | Qty: 60 | Fill #0

## 2017-07-10 ENCOUNTER — Other Ambulatory Visit: Payer: Self-pay | Admitting: Internal Medicine

## 2017-07-10 MED FILL — AMLODIPINE BESYLATE 10 MG T: 10 | 90 days supply | Qty: 90 | Fill #0

## 2017-07-23 MED FILL — LOSARTAN-HCTZ 100-25 MG TAB: 100-25 | 30 days supply | Qty: 30 | Fill #3

## 2017-07-24 MED FILL — ACCU-CHEK GUIDE TEST STRIP: 33 days supply | Qty: 100 | Fill #0

## 2017-07-29 MED FILL — SILDENAFIL CITRATE 100 MG T: 100 | 60 days supply | Qty: 12 | Fill #1

## 2017-08-06 MED FILL — GLIMEPIRIDE 4 MG TABLET: 4 | 30 days supply | Qty: 60 | Fill #0

## 2017-08-13 ENCOUNTER — Other Ambulatory Visit: Payer: Self-pay | Admitting: Internal Medicine

## 2017-08-20 ENCOUNTER — Other Ambulatory Visit: Payer: Self-pay | Admitting: Internal Medicine

## 2017-08-20 MED FILL — LOSARTAN POTASSIUM-HCTZ 100: 100-25 | 30 days supply | Qty: 30 | Fill #4

## 2017-08-21 MED FILL — CITALOPRAM HBR 10 MG TABLET: 10 | 90 days supply | Qty: 180 | Fill #0

## 2017-08-29 MED FILL — LANTUS SOLOSTAR 100 UNITS/M: 100 | 3 days supply | Qty: 3 | Fill #0

## 2017-09-01 MED FILL — LANTUS SOLOSTAR 100 UNITS/M: 100 | 90 days supply | Qty: 90 | Fill #0

## 2017-09-08 MED FILL — INVOKAMET XR 150-1,000 MG T: 150-1000 | 90 days supply | Qty: 180 | Fill #1

## 2017-09-08 MED FILL — GLIMEPIRIDE 4 MG TABLET: 4 | 30 days supply | Qty: 60 | Fill #0

## 2017-09-12 MED FILL — SILDENAFIL CITRATE 100 MG T: 100 | 60 days supply | Qty: 12 | Fill #2

## 2017-09-22 MED FILL — LOSARTAN POTASSIUM-HCTZ 100: 100-25 | 30 days supply | Qty: 30 | Fill #5

## 2017-09-26 ENCOUNTER — Ambulatory Visit: Payer: Self-pay | Admitting: Internal Medicine

## 2017-10-06 ENCOUNTER — Other Ambulatory Visit: Payer: Self-pay | Admitting: Internal Medicine

## 2017-10-06 MED FILL — GLIMEPIRIDE 4 MG TABLET: 4 | 10 days supply | Qty: 20 | Fill #0

## 2017-10-07 MED FILL — buPROPion HCL ER (XL) 150 M: 150 | 90 days supply | Qty: 90 | Fill #0

## 2017-10-17 MED FILL — GLIMEPIRIDE 4 MG TABLET: 4 | 90 days supply | Qty: 180 | Fill #0

## 2017-10-22 MED FILL — LOSARTAN-HCTZ 100-25 MG TAB: 100-25 | 90 days supply | Qty: 90 | Fill #0

## 2017-10-31 ENCOUNTER — Other Ambulatory Visit: Payer: Self-pay | Admitting: Internal Medicine

## 2017-10-31 MED FILL — FUROSEMIDE 20 MG TABS: 20 | 90 days supply | Qty: 180 | Fill #0

## 2017-10-31 MED FILL — NORTRIPTYLINE HCL 25 MG CAP: 25 | 90 days supply | Qty: 180 | Fill #0

## 2017-11-11 MED FILL — ACCU-CHEK GUIDE TEST STRIP: 33 days supply | Qty: 100 | Fill #1

## 2017-11-21 MED FILL — LANTUS SOLOSTAR 100 UNITS/M: 100 | 45 days supply | Qty: 45 | Fill #1

## 2017-11-24 ENCOUNTER — Other Ambulatory Visit: Payer: Self-pay | Admitting: Internal Medicine

## 2017-11-24 MED FILL — CITALOPRAM HBR 10 MG TABLET: 10 | 90 days supply | Qty: 180 | Fill #0

## 2017-12-01 ENCOUNTER — Encounter: Payer: Self-pay | Admitting: Internal Medicine

## 2017-12-01 ENCOUNTER — Ambulatory Visit (INDEPENDENT_AMBULATORY_CARE_PROVIDER_SITE_OTHER): Payer: No Typology Code available for payment source | Admitting: Internal Medicine

## 2017-12-01 VITALS — BP 150/84 | HR 100 | Ht 72.0 in | Wt 304.0 lb

## 2017-12-01 DIAGNOSIS — E1140 Type 2 diabetes mellitus with diabetic neuropathy, unspecified: Secondary | ICD-10-CM | POA: Diagnosis not present

## 2017-12-01 DIAGNOSIS — Z23 Encounter for immunization: Secondary | ICD-10-CM | POA: Diagnosis not present

## 2017-12-01 DIAGNOSIS — F339 Major depressive disorder, recurrent, unspecified: Secondary | ICD-10-CM | POA: Diagnosis not present

## 2017-12-01 DIAGNOSIS — E1165 Type 2 diabetes mellitus with hyperglycemia: Secondary | ICD-10-CM | POA: Diagnosis not present

## 2017-12-01 DIAGNOSIS — I10 Essential (primary) hypertension: Secondary | ICD-10-CM

## 2017-12-01 DIAGNOSIS — Z6841 Body Mass Index (BMI) 40.0 and over, adult: Secondary | ICD-10-CM | POA: Diagnosis not present

## 2017-12-01 DIAGNOSIS — Z0000 Encounter for general adult medical examination without abnormal findings: Secondary | ICD-10-CM

## 2017-12-01 DIAGNOSIS — IMO0002 Reserved for concepts with insufficient information to code with codable children: Secondary | ICD-10-CM

## 2017-12-01 NOTE — Assessment & Plan Note (Signed)
On Rx 

## 2017-12-01 NOTE — Progress Notes (Signed)
Subjective:  Patient ID: Victor Byrd, male    DOB: 1966/04/04  Age: 52 y.o. MRN: [id number]  CC: No chief complaint on file.   HPI Victor Byrd presents for a well exam C/o R shoulder pain at night F/u HTN, DM, depression f/u  Outpatient Medications Prior to Visit  Medication Sig Dispense Refill  . albuterol (VENTOLIN HFA) 108 (90 BASE) MCG/ACT inhaler INHALE 2 PUFFS INTO THE LUNGS EVERY 6 HOURS AS NEEDED FOR WHEEZING. 3 Inhaler 3  . amLODipine (NORVASC) 10 MG tablet Take 1 tablet (10 mg total) by mouth daily. 90 tablet 3  . aspirin 81 MG tablet Take 81 mg by mouth daily.      Marland Kitchen atorvastatin (LIPITOR) 40 MG tablet Take 1 tablet (40 mg total) by mouth daily. 30 tablet 5  . buPROPion (WELLBUTRIN XL) 150 MG 24 hr tablet TAKE 1 TABLET BY MOUTH DAILY. 90 tablet 3  . Cholecalciferol (VITAMIN D3) 1000 UNITS tablet Take 1,000 Units by mouth daily.      . citalopram (CELEXA) 10 MG tablet TAKE 2 TABLETS BY MOUTH DAILY 180 tablet 0  . diclofenac (VOLTAREN) 75 MG EC tablet TAKE 1 TABLET BY MOUTH TWICE DAILY 30 tablet 1  . erythromycin (ROMYCIN) ophthalmic ointment Place 1 application into the right eye at bedtime. 3.5 g 0  . furosemide (LASIX) 20 MG tablet Take 1-2 tablets (20-40 mg total) by mouth daily as needed. Must keep appt in July to get refills 180 tablet 0  . glimepiride (AMARYL) 4 MG tablet Take 1 tablet by mouth daily with breakfast.   6  . INVOKAMET XR [phone] MG TB24   4  . LANTUS SOLOSTAR 100 UNIT/ML Solostar Pen Inject 65 Units into the skin 2 (two) times daily. 75 units every morning, 75 units every evening  3  . losartan-hydrochlorothiazide (HYZAAR) 100-25 MG tablet Take 1 tablet by mouth daily.  6  . Multiple Vitamin (MULTIVITAMIN WITH MINERALS) TABS tablet Take 1 tablet by mouth daily.    . nortriptyline (PAMELOR) 25 MG capsule Take 1-2 capsules (25-50 mg total) by mouth at bedtime. Must keep appt in July to get refills 180 capsule 0  . sildenafil (VIAGRA) 100 MG tablet  TAKE 1 TABLET BY MOUTH AS NEEDED FOR ERECTILE DYSFUNCTION. 12 tablet 3  . ketoconazole (NIZORAL) 200 MG tablet TAKE 1 TABLET BY MOUTH ONCE DAILY (Patient not taking: Reported on 12/01/2017) 14 tablet 1   No facility-administered medications prior to visit.     ROS: Review of Systems  Constitutional: Negative for appetite change, fatigue and unexpected weight change.  HENT: Negative for congestion, nosebleeds, sneezing, sore throat and trouble swallowing.   Eyes: Negative for itching and visual disturbance.  Respiratory: Negative for cough.   Cardiovascular: Negative for chest pain, palpitations and leg swelling.  Gastrointestinal: Negative for abdominal distention, blood in stool, diarrhea and nausea.  Genitourinary: Negative for frequency and hematuria.  Musculoskeletal: Positive for arthralgias. Negative for back pain, gait problem, joint swelling and neck pain.  Skin: Negative for rash.  Neurological: Negative for dizziness, tremors, speech difficulty and weakness.  Psychiatric/Behavioral: Negative for agitation, dysphoric mood and sleep disturbance. The patient is not nervous/anxious.     Objective:  BP (!) 150/84 (BP Location: Left Arm, Patient Position: Sitting, Cuff Size: Large)   Pulse 100   Ht 6' (1.829 m)   Wt (!) 304 lb (137.9 kg)   SpO2 95%   BMI 41.23 kg/m   BP Readings from Last 3  Encounters:  12/01/17 (!) 150/84  05/08/17 (!) 160/90  12/18/16 (!) 160/90    Wt Readings from Last 3 Encounters:  12/01/17 (!) 304 lb (137.9 kg)  05/08/17 (!) 307 lb (139.3 kg)  12/18/16 (!) 308 lb (139.7 kg)    Physical Exam  Constitutional: He is oriented to person, place, and time. He appears well-developed. No distress.  NAD  HENT:  Mouth/Throat: Oropharynx is clear and moist.  Eyes: Pupils are equal, round, and reactive to light. Conjunctivae are normal.  Neck: Normal range of motion. No JVD present. No thyromegaly present.  Cardiovascular: Normal rate, regular rhythm,  normal heart sounds and intact distal pulses. Exam reveals no gallop and no friction rub.  No murmur heard. Pulmonary/Chest: Effort normal and breath sounds normal. No respiratory distress. He has no wheezes. He has no rales. He exhibits no tenderness.  Abdominal: Soft. Bowel sounds are normal. He exhibits no distension and no mass. There is no tenderness. There is no rebound and no guarding.  Musculoskeletal: Normal range of motion. He exhibits no edema or tenderness.  Lymphadenopathy:    He has no cervical adenopathy.  Neurological: He is alert and oriented to person, place, and time. He has normal reflexes. No cranial nerve deficit. He exhibits normal muscle tone. He displays a negative Romberg sign. Coordination and gait normal.  Skin: Skin is warm and dry. No rash noted.  Psychiatric: He has a normal mood and affect. His behavior is normal. Judgment and thought content normal.    Lab Results  Component Value Date   WBC 10.9 (H) 08/31/2015   HGB 14.1 08/31/2015   HCT 41.4 08/31/2015   PLT 306.0 08/31/2015   GLUCOSE 170 (H) 03/13/2016   CHOL 127 06/29/2015   TRIG 114.0 06/29/2015   HDL 44.60 06/29/2015   LDLCALC 59 06/29/2015   ALT 48 06/29/2015   AST 28 06/29/2015   NA 140 03/13/2016   K 3.8 03/13/2016   CL 102 03/13/2016   CREATININE 0.81 03/13/2016   BUN 14 03/13/2016   CO2 30 03/13/2016   TSH 2.68 06/29/2015   PSA 0.69 06/29/2015   HGBA1C 8.8 (H) 03/13/2016    Dg Ugi W/kub  Result Date: 03/20/2011 *RADIOLOGY REPORT* Clinical Data:  Morbid obesity.  Pre-op evaluation for bariatric surgery. UPPER GI SERIES WITH KUB Technique:  After obtaining a scout radiograph a single-column upper GI series was performed using thin barium. Fluoroscopy time: 1.8 minutes Findings:  The scout radiograph shows a normal bowel gas pattern. There is no evidence of esophageal mass or stricture.  There is no evidence of hiatal hernia, and no gastroesophageal reflux was seen during the exam.   Esophageal motility is within normal limits. The stomach is normal in appearance.  There is no evidence of gastric masses or ulcers.  Duodenal bulb and sweep are normal in appearance. IMPRESSION: Negative UGI series. Original Report Authenticated By: Marlaine Hind, M.D.   Assessment & Plan:   There are no diagnoses linked to this encounter.   No orders of the defined types were placed in this encounter.    Follow-up: No follow-ups on file.  Walker Kehr, MD

## 2017-12-01 NOTE — Assessment & Plan Note (Signed)
Seeing Dr Chalmers Cater regular

## 2017-12-01 NOTE — Addendum Note (Signed)
Addended by: Cresenciano Lick on: 12/01/2017 04:23 PM   Modules accepted: Orders

## 2017-12-01 NOTE — Assessment & Plan Note (Addendum)
We discussed age appropriate health related issues, including available/recomended screening tests and vaccinations. We discussed a need for adhering to healthy diet and exercise. Labs were ordered to be later reviewed . All questions were answered. Prevnar Colon - he will let me know

## 2017-12-01 NOTE — Assessment & Plan Note (Signed)
Losartan HCT Furosemide

## 2017-12-01 NOTE — Assessment & Plan Note (Addendum)
Ref to Dr Leafy Ro offered Harrie Jeans wants to do Plan Z

## 2017-12-02 ENCOUNTER — Other Ambulatory Visit (INDEPENDENT_AMBULATORY_CARE_PROVIDER_SITE_OTHER): Payer: No Typology Code available for payment source

## 2017-12-02 DIAGNOSIS — I10 Essential (primary) hypertension: Secondary | ICD-10-CM | POA: Diagnosis not present

## 2017-12-02 DIAGNOSIS — E1165 Type 2 diabetes mellitus with hyperglycemia: Secondary | ICD-10-CM

## 2017-12-02 DIAGNOSIS — Z0000 Encounter for general adult medical examination without abnormal findings: Secondary | ICD-10-CM | POA: Diagnosis not present

## 2017-12-02 DIAGNOSIS — IMO0002 Reserved for concepts with insufficient information to code with codable children: Secondary | ICD-10-CM

## 2017-12-02 DIAGNOSIS — E1140 Type 2 diabetes mellitus with diabetic neuropathy, unspecified: Secondary | ICD-10-CM | POA: Diagnosis not present

## 2017-12-02 LAB — BASIC METABOLIC PANEL
BUN: 16 mg/dL (ref 6–23)
CO2: 32 mEq/L (ref 19–32)
Calcium: 9.1 mg/dL (ref 8.4–10.5)
Chloride: 100 mEq/L (ref 96–112)
Creatinine, Ser: 0.77 mg/dL (ref 0.40–1.50)
GFR: 113.46 mL/min (ref >60.00)
Glucose, Bld: 93 mg/dL (ref 70–99)
Potassium: 3.9 mEq/L (ref 3.5–5.1)
SODIUM: 139 meq/L (ref 135–145)

## 2017-12-02 LAB — URINALYSIS, ROUTINE W REFLEX MICROSCOPIC
Bilirubin Urine: NEGATIVE
HGB URINE DIPSTICK: NEGATIVE
KETONES UR: NEGATIVE
NITRITE: NEGATIVE
RBC / HPF: NONE SEEN (ref 0–?)
Specific Gravity, Urine: 1.01 (ref 1.000–1.030)
Total Protein, Urine: NEGATIVE
UROBILINOGEN UA: 0.2 (ref 0.0–1.0)
pH: 6.5 (ref 5.0–8.0)

## 2017-12-02 LAB — LIPID PANEL
CHOLESTEROL: 206 mg/dL — AB (ref 0–200)
HDL: 41.2 mg/dL (ref >39.00)
LDL CALC: 130 mg/dL — AB (ref 0–99)
NonHDL: 165.21
TRIGLYCERIDES: 178 mg/dL — AB (ref 0.0–149.0)
Total CHOL/HDL Ratio: 5
VLDL: 35.6 mg/dL (ref 0.0–40.0)

## 2017-12-02 LAB — CBC WITH DIFFERENTIAL/PLATELET
BASOS PCT: 1 % (ref 0.0–3.0)
Basophils Absolute: 0.1 10*3/uL (ref 0.0–0.1)
EOS ABS: 0.3 10*3/uL (ref 0.0–0.7)
EOS PCT: 3.1 % (ref 0.0–5.0)
HEMATOCRIT: 44.3 % (ref 39.0–52.0)
HEMOGLOBIN: 15.1 g/dL (ref 13.0–17.0)
LYMPHS ABS: 2.5 10*3/uL (ref 0.7–4.0)
Lymphocytes Relative: 25.5 % (ref 12.0–46.0)
MCHC: 34 g/dL (ref 30.0–36.0)
MCV: 81.5 fl (ref 78.0–100.0)
MONO ABS: 0.9 10*3/uL (ref 0.1–1.0)
Monocytes Relative: 9.3 % (ref 3.0–12.0)
NEUTROS ABS: 6.1 10*3/uL (ref 1.4–7.7)
Neutrophils Relative %: 61.1 % (ref 43.0–77.0)
PLATELETS: 264 10*3/uL (ref 150.0–400.0)
RBC: 5.44 Mil/uL (ref 4.22–5.81)
RDW: 14 % (ref 11.5–15.5)
WBC: 9.9 10*3/uL (ref 4.0–10.5)

## 2017-12-02 LAB — HEPATIC FUNCTION PANEL
ALT: 38 U/L (ref 0–53)
AST: 27 U/L (ref 0–37)
Albumin: 4.3 g/dL (ref 3.5–5.2)
Alkaline Phosphatase: 52 U/L (ref 39–117)
BILIRUBIN DIRECT: 0.1 mg/dL (ref 0.0–0.3)
TOTAL PROTEIN: 7.1 g/dL (ref 6.0–8.3)
Total Bilirubin: 0.4 mg/dL (ref 0.2–1.2)

## 2017-12-02 LAB — TSH: TSH: 3.2 u[IU]/mL (ref 0.35–4.50)

## 2017-12-02 LAB — PSA: PSA: 0.66 ng/mL (ref 0.10–4.00)

## 2017-12-09 ENCOUNTER — Other Ambulatory Visit: Payer: Self-pay

## 2017-12-09 DIAGNOSIS — E1140 Type 2 diabetes mellitus with diabetic neuropathy, unspecified: Secondary | ICD-10-CM

## 2017-12-09 DIAGNOSIS — E1165 Type 2 diabetes mellitus with hyperglycemia: Principal | ICD-10-CM

## 2017-12-09 DIAGNOSIS — IMO0002 Reserved for concepts with insufficient information to code with codable children: Secondary | ICD-10-CM

## 2017-12-09 MED FILL — INVOKAMET XR 150-1,000 MG T: 150-1000 | 90 days supply | Qty: 180 | Fill #2

## 2017-12-26 MED FILL — LANTUS SOLOSTAR 100 UNITS/M: 100 | 45 days supply | Qty: 45 | Fill #0

## 2017-12-30 MED FILL — AMLODIPINE BESYLATE 10 MG T: 10 | 90 days supply | Qty: 90 | Fill #1

## 2018-01-06 MED FILL — FLUCONAZOLE 150 MG TABS: 150 | 3 days supply | Qty: 3 | Fill #0

## 2018-01-12 MED FILL — GLIMEPIRIDE 4 MG TABLET: 4 | 90 days supply | Qty: 180 | Fill #0

## 2018-01-13 MED FILL — SILDENAFIL CITRATE 100 MG T: 100 | 60 days supply | Qty: 12 | Fill #3

## 2018-01-26 ENCOUNTER — Other Ambulatory Visit: Payer: Self-pay | Admitting: Internal Medicine

## 2018-01-26 MED FILL — LOSARTAN-HCTZ 100-25 MG TAB: 100-25 | 90 days supply | Qty: 90 | Fill #1

## 2018-01-27 ENCOUNTER — Other Ambulatory Visit: Payer: Self-pay | Admitting: Internal Medicine

## 2018-01-27 MED FILL — FLUCONAZOLE 150 MG TABS: 150 | 84 days supply | Qty: 12 | Fill #0

## 2018-01-27 MED FILL — NORTRIPTYLINE HCL 25 MG CAP: 25 | 90 days supply | Qty: 180 | Fill #0

## 2018-01-28 MED FILL — ATORVASTATIN 40 MG TABLET: 40 | 30 days supply | Qty: 30 | Fill #0

## 2018-01-30 ENCOUNTER — Telehealth: Payer: Self-pay | Admitting: *Deleted

## 2018-01-30 DIAGNOSIS — Z1211 Encounter for screening for malignant neoplasm of colon: Secondary | ICD-10-CM

## 2018-01-30 NOTE — Telephone Encounter (Signed)
Copied from [place] [id number]. Topic: Referral - Request >> Jan 29, 2018  3:44 PM Carolyn Stare wrote:  Pt request  a referral for a preventive colonoscopy at [facility]  . He went to the appt today that he said was set up by Dr Chalmers Cater office. At the appt today the office told him that they did not have the referral and that he had to contact his PCP. He appt was with Dr Watt Climes.

## 2018-02-02 NOTE — Telephone Encounter (Signed)
[place] place the ref Thx

## 2018-02-03 ENCOUNTER — Other Ambulatory Visit: Payer: Self-pay | Admitting: Internal Medicine

## 2018-02-03 MED FILL — FUROSEMIDE 20 MG TABS: 20 | 90 days supply | Qty: 180 | Fill #0

## 2018-02-03 NOTE — Telephone Encounter (Signed)
Called pt no answer LMOM since Dr. Chalmers Cater office referred him to Dr. Watt Climes @ [facility]. Dr. Chalmers Cater office is needing to send the referral.

## 2018-02-04 MED FILL — LANTUS SOLOSTAR 100 UNITS/M: 100 | 90 days supply | Qty: 135 | Fill #1

## 2018-02-20 MED FILL — ACCU-CHEK GUIDE TEST STRIP: 33 days supply | Qty: 100 | Fill #2

## 2018-03-05 MED FILL — METFORMIN HCL ER 500 MG TAB: 500 | 90 days supply | Qty: 360 | Fill #0

## 2018-03-09 MED FILL — buPROPion HCL ER (XL) 150 M: 150 | 90 days supply | Qty: 90 | Fill #1

## 2018-03-09 MED FILL — ATORVASTATIN 40 MG TABLET: 40 | 30 days supply | Qty: 30 | Fill #1

## 2018-03-12 ENCOUNTER — Other Ambulatory Visit: Payer: Self-pay | Admitting: Internal Medicine

## 2018-03-13 MED FILL — SILDENAFIL CITRATE 100 MG T: 100 | 30 days supply | Qty: 6 | Fill #0

## 2018-03-16 ENCOUNTER — Other Ambulatory Visit: Payer: Self-pay | Admitting: Internal Medicine

## 2018-03-16 MED FILL — CITALOPRAM HBR 10 MG TABLET: 10 | 90 days supply | Qty: 180 | Fill #0

## 2018-04-09 MED FILL — ATORVASTATIN 40 MG TABLET: 40 | 30 days supply | Qty: 30 | Fill #2

## 2018-04-17 MED FILL — GLIMEPIRIDE 4 MG TABLET: 4 | 90 days supply | Qty: 180 | Fill #1

## 2018-04-17 MED FILL — SILDENAFIL CITRATE 100 MG T: 100 | 30 days supply | Qty: 6 | Fill #1

## 2018-04-17 MED FILL — AMLODIPINE BESYLATE 10 MG T: 10 | 90 days supply | Qty: 90 | Fill #2

## 2018-05-04 MED FILL — NORTRIPTYLINE HCL CAP 25 MG: 25 | 90 days supply | Qty: 180 | Fill #1

## 2018-05-12 ENCOUNTER — Encounter (HOSPITAL_COMMUNITY): Payer: Self-pay | Admitting: Emergency Medicine

## 2018-05-12 ENCOUNTER — Emergency Department (HOSPITAL_COMMUNITY)
Admission: EM | Admit: 2018-05-12 | Discharge: 2018-05-12 | Disposition: A | Discharge status: Home / Self Care | Payer: No Typology Code available for payment source | Attending: Emergency Medicine | Admitting: Emergency Medicine

## 2018-05-12 ENCOUNTER — Ambulatory Visit: Payer: Self-pay | Admitting: *Deleted

## 2018-05-12 DIAGNOSIS — I10 Essential (primary) hypertension: Secondary | ICD-10-CM | POA: Diagnosis not present

## 2018-05-12 DIAGNOSIS — E1140 Type 2 diabetes mellitus with diabetic neuropathy, unspecified: Secondary | ICD-10-CM | POA: Diagnosis not present

## 2018-05-12 DIAGNOSIS — Z79899 Other long term (current) drug therapy: Secondary | ICD-10-CM | POA: Insufficient documentation

## 2018-05-12 DIAGNOSIS — Z794 Long term (current) use of insulin: Secondary | ICD-10-CM | POA: Insufficient documentation

## 2018-05-12 DIAGNOSIS — Z7982 Long term (current) use of aspirin: Secondary | ICD-10-CM | POA: Insufficient documentation

## 2018-05-12 DIAGNOSIS — J45909 Unspecified asthma, uncomplicated: Secondary | ICD-10-CM | POA: Insufficient documentation

## 2018-05-12 DIAGNOSIS — I160 Hypertensive urgency: Secondary | ICD-10-CM | POA: Insufficient documentation

## 2018-05-12 DIAGNOSIS — R51 Headache: Secondary | ICD-10-CM | POA: Diagnosis present

## 2018-05-12 LAB — CBC WITH DIFFERENTIAL/PLATELET
Abs Immature Granulocytes: 0.06 10*3/uL (ref 0.00–0.07)
BASOS ABS: 0.1 10*3/uL (ref 0.0–0.1)
Basophils Relative: 1 %
EOS PCT: 2 %
Eosinophils Absolute: 0.2 10*3/uL (ref 0.0–0.5)
HEMATOCRIT: 45 % (ref 39.0–52.0)
HEMOGLOBIN: 14.6 g/dL (ref 13.0–17.0)
Immature Granulocytes: 1 %
LYMPHS ABS: 2.5 10*3/uL (ref 0.7–4.0)
LYMPHS PCT: 24 %
MCH: 26.9 pg (ref 26.0–34.0)
MCHC: 32.4 g/dL (ref 30.0–36.0)
MCV: 83 fL (ref 80.0–100.0)
MONO ABS: 0.8 10*3/uL (ref 0.1–1.0)
Monocytes Relative: 8 %
NRBC: 0 % (ref 0.0–0.2)
Neutro Abs: 6.6 10*3/uL (ref 1.7–7.7)
Neutrophils Relative %: 64 %
Platelets: 283 10*3/uL (ref 150–400)
RBC: 5.42 MIL/uL (ref 4.22–5.81)
RDW: 13.2 % (ref 11.5–15.5)
WBC: 10.3 10*3/uL (ref 4.0–10.5)

## 2018-05-12 LAB — BASIC METABOLIC PANEL
Anion gap: 8 (ref 5–15)
BUN: 13 mg/dL (ref 6–20)
CALCIUM: 9.2 mg/dL (ref 8.9–10.3)
CHLORIDE: 103 mmol/L (ref 98–111)
CO2: 30 mmol/L (ref 22–32)
CREATININE: 0.7 mg/dL (ref 0.61–1.24)
GFR calc Af Amer: >60 mL/min (ref >60)
GFR calc non Af Amer: >60 mL/min (ref >60)
Glucose, Bld: 142 mg/dL — ABNORMAL HIGH (ref 70–99)
Potassium: 3.4 mmol/L — ABNORMAL LOW (ref 3.5–5.1)
Sodium: 141 mmol/L (ref 135–145)

## 2018-05-12 MED FILL — LOSARTAN-HCTZ 100-25 MG TAB: 100-25 | 30 days supply | Qty: 30 | Fill #2

## 2018-05-12 MED FILL — FLUCONAZOLE 150 MG TABS: 150 | 84 days supply | Qty: 12 | Fill #1

## 2018-05-12 NOTE — Telephone Encounter (Signed)
Please advise 

## 2018-05-12 NOTE — Telephone Encounter (Signed)
Patient is calling to report that he started having visual problems on his way to work and had to turn around and and go home. His BP readings are elevated. Per protocol patient advised ED visit for hypertensive symptoms and elevated BP- wife declines ED- and they want office visit. Informed patient that I am not allowed to male that visit if they are directed to go to ED and they are not pleased. Office notified.  Reason for Disposition . [2] Systolic BP  >= 694 OR Diastolic >= 854 AND [6] cardiac or neurologic symptoms (e.g., chest pain, difficulty breathing, unsteady gait, blurred vision)  Answer Assessment - Initial Assessment Questions 1. BLOOD PRESSURE: "What is the blood pressure?" "Did you take at least two measurements 5 minutes apart?"     240/100 , 210/90 2. ONSET: "When did you take your blood pressure?"     7:30, 8:45 3. HOW: "How did you obtain the blood pressure?" (e.g., visiting nurse, automatic home BP monitor)     manual 4. HISTORY: "Do you have a history of high blood pressure?"     yes 5. MEDICATIONS: "Are you taking any medications for blood pressure?" "Have you missed any doses recently?"     Yes- no 6. OTHER SYMPTOMS: "Do you have any symptoms?" (e.g., headache, chest pain, blurred vision, difficulty breathing, weakness)     Visual changes- spots, lightheaded, headaches 7. PREGNANCY: "Is there any chance you are pregnant?" "When was your last menstrual period?"     n/a  Protocols used: HIGH BLOOD PRESSURE-A-AH

## 2018-05-12 NOTE — Discharge Instructions (Signed)
Be sure to take your blood pressure medication as directed. Monitor your blood pressure over the next several days, and contact your primary care provider to reassess your treatment plan if it continues to remain elevated.

## 2018-05-12 NOTE — ED Triage Notes (Signed)
Per pt/wife-states BP has been high for a couple of days-states he has been having a headache as well-PCP sent him here for eval-took all meds except for his Losartan this am-states systolic BP was 078 at home

## 2018-05-12 NOTE — ED Provider Notes (Signed)
[facility] DEPT Provider Note   CSN: [id number] Arrival date & time: 05/12/18  7564     History   Chief Complaint Chief Complaint  Patient presents with  . Hypertension    HPI Victor Byrd is a 52 y.o. male.  Patient with intermittently elevated blood pressure at home. Today rose to 210/90, with headache (relieved by aleve) and seeing spots. Headache and visual disturbance have resolved. Headache bilateral in the temples. No nausea, vomiting. No extremity weakness. No chest pain. He is currently being treated for hypertension, but did not take his losartan this morning.   The history is provided by the patient. No language interpreter was used.  Hypertension  This is a recurrent problem. The current episode started 12 to 24 hours ago. The problem occurs daily. The problem has been gradually improving. Associated symptoms include headaches. Pertinent negatives include no chest pain, no abdominal pain and no shortness of breath.    Past Medical History:  Diagnosis Date  . Asthma   . Depression   . Diabetes mellitus type II   . Hyperlipidemia   . Hypertension   . Hyperthyroidism   . OSA on CPAP    Dr. Chalmers Cater  . Restless legs     Patient Active Problem List   Diagnosis Date Noted  . Tear duct infection, right 05/08/2017  . Tinnitus 12/18/2016  . Hypogonadism male 12/18/2016  . Decreased libido 12/18/2016  . Candidal balanitis 07/18/2016  . IT band syndrome 10/11/2015  . Edema 10/11/2015  . Lip lesion 08/31/2015  . Erectile dysfunction 06/28/2015  . Tinnitus of both ears 08/04/2014  . Rotator cuff strain 06/01/2014  . Acute pharyngitis 09/28/2013  . Insomnia 08/30/2013  . Chest wall pain 10/02/2011  . Costochondritis 10/02/2011  . URI, acute 07/17/2011  . Atypical chest pain 06/12/2011  . Chest pain, atypical 05/15/2011  . UTI (urinary tract infection) 11/25/2010  . Well adult exam 11/05/2010  . Laceration of index finger  09/11/2010  . NECK PAIN, ACUTE 07/16/2010  . LYMPHADENITIS 06/12/2010  . APHTHOUS ULCERS 06/12/2010  . OBSTRUCTIVE SLEEP APNEA 02/21/2010  . URINARY URGENCY 02/21/2010  . ADHD 11/09/2009  . HYPERTHYROIDISM 09/07/2009  . DM type 2, uncontrolled, with neuropathy ([place]) 09/07/2009  . Morbid obesity with BMI of 40.0-44.9, adult ([place]) 09/07/2009  . Depression, major 09/07/2009  . Essential hypertension 09/07/2009  . Asthma 09/07/2009  . RENAL CALCULUS, HX OF 09/07/2009  . WISDOM TEETH EXTRACTION, HX OF 09/07/2009    Past Surgical History:  Procedure Laterality Date  . [place]  . VASECTOMY  2007  . WISDOM TOOTH EXTRACTION  1992        Home Medications    Prior to Admission medications   Medication Sig Start Date End Date Taking? Authorizing Provider  albuterol (VENTOLIN HFA) 108 (90 BASE) MCG/ACT inhaler INHALE 2 PUFFS INTO THE LUNGS EVERY 6 HOURS AS NEEDED FOR WHEEZING. 06/01/14   Plotnikov, Evie Lacks, MD  amLODipine (NORVASC) 10 MG tablet Take 1 tablet (10 mg total) by mouth daily. 07/10/17   Plotnikov, Evie Lacks, MD  aspirin 81 MG tablet Take 81 mg by mouth daily.      [provider]  atorvastatin (LIPITOR) 40 MG tablet TAKE 1 TABLET BY MOUTH ONCE DAILY 01/28/18   Plotnikov, Evie Lacks, MD  buPROPion (WELLBUTRIN XL) 150 MG 24 hr tablet TAKE 1 TABLET BY MOUTH DAILY. 10/07/17   Plotnikov, Evie Lacks, MD  Cholecalciferol (VITAMIN D3) 1000  UNITS tablet Take 1,000 Units by mouth daily.      [provider]  citalopram (CELEXA) 10 MG tablet Take 2 tablets (20 mg total) by mouth daily. 03/16/18   Plotnikov, Evie Lacks, MD  diclofenac (VOLTAREN) 75 MG EC tablet TAKE 1 TABLET BY MOUTH TWICE DAILY 12/18/15   Plotnikov, Evie Lacks, MD  erythromycin [facility]) ophthalmic ointment Place 1 application into the right eye at bedtime. 05/08/17   Hoyt Koch, MD  furosemide (LASIX) 20 MG tablet TAKE 1-2 TABLETS (20-40 MG TOTAL) BY MOUTH DAILY AS NEEDED. MUST KEEP  APPT IN JULY TO GET REFILLS 02/03/18   Plotnikov, Evie Lacks, MD  glimepiride (AMARYL) 4 MG tablet Take 1 tablet by mouth daily with breakfast.  06/12/15   [provider]  INVOKAMET XR [phone] MG TB24  06/11/16   [provider]  LANTUS SOLOSTAR 100 UNIT/ML Solostar Pen Inject 65 Units into the skin 2 (two) times daily. 75 units every morning, 75 units every evening 04/28/15   [provider]  losartan-hydrochlorothiazide (HYZAAR) 100-25 MG tablet Take 1 tablet by mouth daily. 06/15/15   [provider]  Multiple Vitamin (MULTIVITAMIN WITH MINERALS) TABS tablet Take 1 tablet by mouth daily.    [provider]  nortriptyline (PAMELOR) 25 MG capsule Take 1-2 capsules (25-50 mg total) by mouth at bedtime. 01/27/18   Plotnikov, Evie Lacks, MD  sildenafil (VIAGRA) 100 MG tablet TAKE 1 TABLET BY MOUTH AS NEEDED FOR ERECTILE DYSFUNCTION. 03/13/18   Plotnikov, Evie Lacks, MD    Family History Family History  Problem Relation Age of Onset  . Diabetes Mother   . Heart disease Father   . Heart attack Father   . Hypertension Father   . Diabetes Other   . Hyperlipidemia Other   . Lung cancer Other   . Hypertension Other     Social History Social History   Tobacco Use  . Smoking status: Never Smoker  . Smokeless tobacco: Never Used  Substance Use Topics  . Alcohol use: Yes    Alcohol/week: 0.0 standard drinks  . Drug use: No     Allergies   Ramipril   Review of Systems Review of Systems  Constitutional: Positive for fatigue.  Respiratory: Negative for shortness of breath.   Cardiovascular: Negative for chest pain.  Gastrointestinal: Negative for abdominal pain.  Neurological: Positive for headaches.  All other systems reviewed and are negative.    Physical Exam Updated Vital Signs BP (!) 156/109 (BP Location: Left Arm)   Pulse 69   Temp 98.8 F (37.1 C) (Oral)   Resp 14   Ht 6' (1.829 m)   Wt (!) 138.3 kg   SpO2 98%   BMI 41.37 kg/m     Physical Exam  Constitutional: He is oriented to person, place, and time. He appears well-developed and well-nourished.  HENT:  Head: Normocephalic.  Eyes: Pupils are equal, round, and reactive to light. Conjunctivae and EOM are normal.  Neck: Neck supple.  Cardiovascular: Normal rate, regular rhythm and normal heart sounds.  Pulmonary/Chest: Effort normal and breath sounds normal.  Abdominal: Soft. Bowel sounds are normal.  Musculoskeletal: Normal range of motion.  Neurological: He is alert and oriented to person, place, and time. No cranial nerve deficit or sensory deficit.  Skin: Skin is warm and dry.  Psychiatric: He has a normal mood and affect.  Nursing note and vitals reviewed.    ED Treatments / Results  Labs (all labs ordered are listed, but  only abnormal results are displayed) Labs Reviewed - No data to display  EKG None  Radiology No results found.  Procedures Procedures (including critical care time)  Medications Ordered in ED Medications - No data to display   Initial Impression / Assessment and Plan / ED Course  I have reviewed the triage vital signs and the nursing notes.  Pertinent labs & imaging results that were available during my care of the patient were reviewed by me and considered in my medical decision making (see chart for details).    BP rechecked manually in room, 168/98.   Patient noted to be hypertensive in the emergency department.  Labs obtained, reviewed. No indication of renal involvement. No LVH on ECG.  Discussed with patient the need for close follow-up and management by their primary care physician.   Final Clinical Impressions(s) / ED Diagnoses   Final diagnoses:  Hypertensive urgency    ED Discharge Orders    None       Etta Quill, NP 05/12/18 1527    Isla Pence, MD 05/12/18 1535

## 2018-05-12 NOTE — Telephone Encounter (Signed)
I called and talked with patient and advised that this is an extreme emergency, called hypertensive emergency---patient states he has missed taking his losartan medication, I have advised that even if he takes losartan, it will not be enough to bring bp reading down to safer level---esp with vision changes, he is having symptoms that warrant emergency room intervention to bring bp down quickly/safely and reduce risk of heart attack or stroke---I have advised patient that we have free standing ED on [address] (gave exact address) that usu has either no or very minimal wait time---pt is only concerned about deductible/cost of emergency room visit---and how that will waste his money---I have advised that deductible money is not wasted if it saves his life---it is not safe to continue with blood pressure readings this high and no intervention--again, -encouraged patient to go to ED now soon---

## 2018-05-28 ENCOUNTER — Other Ambulatory Visit: Payer: Self-pay | Admitting: Internal Medicine

## 2018-05-28 MED FILL — LANTUS SOLOSTAR 100 UNITS/M: 100 | 90 days supply | Qty: 135 | Fill #2

## 2018-05-28 MED FILL — ATORVASTATIN CALCIUM 40 MG: 40 | 30 days supply | Qty: 30 | Fill #3

## 2018-05-28 MED FILL — FUROSEMIDE 20 MG TABS: 20 | 90 days supply | Qty: 180 | Fill #0

## 2018-06-05 MED FILL — LOSARTAN-HCTZ 100-25 MG TAB: 100-25 | 30 days supply | Qty: 30 | Fill #3

## 2018-06-05 MED FILL — buPROPion HCL ER (XL) 150 M: 150 | 90 days supply | Qty: 90 | Fill #2

## 2018-06-10 MED FILL — SILDENAFIL CITRATE 100 MG T: 100 | 30 days supply | Qty: 6 | Fill #2

## 2018-06-17 MED FILL — CITALOPRAM HBR 10 MG TABLET: 10 | 90 days supply | Qty: 180 | Fill #1

## 2018-06-23 MED FILL — metFORMIN HCL ER 500 MG TB2: 500 | 90 days supply | Qty: 360 | Fill #1

## 2018-06-25 ENCOUNTER — Ambulatory Visit (INDEPENDENT_AMBULATORY_CARE_PROVIDER_SITE_OTHER): Payer: No Typology Code available for payment source | Admitting: Internal Medicine

## 2018-06-25 ENCOUNTER — Encounter: Payer: Self-pay | Admitting: Internal Medicine

## 2018-06-25 VITALS — BP 160/84 | HR 96 | Temp 98.9°F | Ht 72.0 in | Wt 303.0 lb

## 2018-06-25 DIAGNOSIS — E1165 Type 2 diabetes mellitus with hyperglycemia: Secondary | ICD-10-CM

## 2018-06-25 DIAGNOSIS — I10 Essential (primary) hypertension: Secondary | ICD-10-CM | POA: Diagnosis not present

## 2018-06-25 DIAGNOSIS — E1140 Type 2 diabetes mellitus with diabetic neuropathy, unspecified: Secondary | ICD-10-CM

## 2018-06-25 DIAGNOSIS — IMO0002 Reserved for concepts with insufficient information to code with codable children: Secondary | ICD-10-CM

## 2018-06-25 DIAGNOSIS — E785 Hyperlipidemia, unspecified: Secondary | ICD-10-CM | POA: Insufficient documentation

## 2018-06-25 DIAGNOSIS — Z23 Encounter for immunization: Secondary | ICD-10-CM

## 2018-06-25 MED ORDER — TELMISARTAN-HCTZ 80-25 MG PO TABS: 1.0000 | ORAL_TABLET | Freq: Every day | ORAL | 3 refills | Status: DC

## 2018-06-25 NOTE — Progress Notes (Signed)
Subjective:  Patient ID: Victor Byrd, male    DOB: 07-09-1965  Age: 53 y.o. MRN: [id number]  CC: No chief complaint on file.   HPI Victor Byrd presents for HTN - elevated BP, DM, dyslipidemia f/u  Appt w/Dr Chalmers Cater - in 1 wk  Outpatient Medications Prior to Visit  Medication Sig Dispense Refill  . metformin (FORTAMET) 1000 MG (OSM) 24 hr tablet Take 1,000 mg by mouth 2 (two) times daily with a meal.    . albuterol (VENTOLIN HFA) 108 (90 BASE) MCG/ACT inhaler INHALE 2 PUFFS INTO THE LUNGS EVERY 6 HOURS AS NEEDED FOR WHEEZING. 3 Inhaler 3  . amLODipine (NORVASC) 10 MG tablet Take 1 tablet (10 mg total) by mouth daily. 90 tablet 3  . aspirin 81 MG tablet Take 81 mg by mouth daily.      Marland Kitchen atorvastatin (LIPITOR) 40 MG tablet TAKE 1 TABLET BY MOUTH ONCE DAILY 30 tablet 5  . buPROPion (WELLBUTRIN XL) 150 MG 24 hr tablet TAKE 1 TABLET BY MOUTH DAILY. 90 tablet 3  . Cholecalciferol (VITAMIN D3) 1000 UNITS tablet Take 1,000 Units by mouth daily.      . citalopram (CELEXA) 10 MG tablet Take 2 tablets (20 mg total) by mouth daily. 180 tablet 1  . diclofenac (VOLTAREN) 75 MG EC tablet TAKE 1 TABLET BY MOUTH TWICE DAILY 30 tablet 1  . erythromycin (ROMYCIN) ophthalmic ointment Place 1 application into the right eye at bedtime. 3.5 g 0  . furosemide (LASIX) 20 MG tablet TAKE 1-2 TABLETS (20-40 MG TOTAL) BY MOUTH DAILY AS NEEDED. MUST KEEP APPT IN JULY TO GET REFILLS 180 tablet 3  . glimepiride (AMARYL) 4 MG tablet Take 1 tablet by mouth daily with breakfast.   6  . INVOKAMET XR [phone] MG TB24   4  . LANTUS SOLOSTAR 100 UNIT/ML Solostar Pen Inject 65 Units into the skin 2 (two) times daily. 75 units every morning, 75 units every evening  3  . losartan-hydrochlorothiazide (HYZAAR) 100-25 MG tablet Take 1 tablet by mouth daily.  6  . Multiple Vitamin (MULTIVITAMIN WITH MINERALS) TABS tablet Take 1 tablet by mouth daily.    . nortriptyline (PAMELOR) 25 MG capsule Take 1-2 capsules (25-50 mg total)  by mouth at bedtime. 180 capsule 1  . sildenafil (VIAGRA) 100 MG tablet TAKE 1 TABLET BY MOUTH AS NEEDED FOR ERECTILE DYSFUNCTION. 12 tablet 3   No facility-administered medications prior to visit.     ROS: Review of Systems  Constitutional: Negative for appetite change, fatigue and unexpected weight change.  HENT: Negative for congestion, nosebleeds, sneezing, sore throat and trouble swallowing.   Eyes: Negative for itching and visual disturbance.  Respiratory: Negative for cough.   Cardiovascular: Negative for chest pain, palpitations and leg swelling.  Gastrointestinal: Negative for abdominal distention, blood in stool, diarrhea and nausea.  Genitourinary: Negative for frequency and hematuria.  Musculoskeletal: Negative for back pain, gait problem, joint swelling and neck pain.  Skin: Negative for rash.  Neurological: Negative for dizziness, tremors, speech difficulty and weakness.  Psychiatric/Behavioral: Positive for dysphoric mood. Negative for agitation, sleep disturbance and suicidal ideas. The patient is nervous/anxious.     Objective:  BP (!) 160/84 (BP Location: Left Arm, Patient Position: Sitting, Cuff Size: Large)   Pulse 96   Temp 98.9 F (37.2 C) (Oral)   Ht 6' (1.829 m)   Wt (!) 303 lb (137.4 kg)   SpO2 95%   BMI 41.09 kg/m   BP Readings  from Last 3 Encounters:  06/25/18 (!) 160/84  05/12/18 (!) 168/115  12/01/17 (!) 150/84    Wt Readings from Last 3 Encounters:  06/25/18 (!) 303 lb (137.4 kg)  05/12/18 (!) 305 lb (138.3 kg)  12/01/17 (!) 304 lb (137.9 kg)    Physical Exam Constitutional:      General: He is not in acute distress.    Appearance: He is well-developed.     Comments: NAD  Eyes:     Conjunctiva/sclera: Conjunctivae normal.     Pupils: Pupils are equal, round, and reactive to light.  Neck:     Musculoskeletal: Normal range of motion.     Thyroid: No thyromegaly.     Vascular: No JVD.  Cardiovascular:     Rate and Rhythm: Normal rate  and regular rhythm.     Heart sounds: Normal heart sounds. No murmur. No friction rub. No gallop.   Pulmonary:     Effort: Pulmonary effort is normal. No respiratory distress.     Breath sounds: Normal breath sounds. No wheezing or rales.  Chest:     Chest wall: No tenderness.  Abdominal:     General: Bowel sounds are normal. There is no distension.     Palpations: Abdomen is soft. There is no mass.     Tenderness: There is no abdominal tenderness. There is no guarding or rebound.  Musculoskeletal: Normal range of motion.        General: No tenderness.  Lymphadenopathy:     Cervical: No cervical adenopathy.  Skin:    General: Skin is warm and dry.     Findings: No rash.  Neurological:     Mental Status: He is alert and oriented to person, place, and time.     Cranial Nerves: No cranial nerve deficit.     Motor: No abnormal muscle tone.     Coordination: Coordination normal.     Gait: Gait normal.     Deep Tendon Reflexes: Reflexes are normal and symmetric.  Psychiatric:        Behavior: Behavior normal.        Thought Content: Thought content normal.        Judgment: Judgment normal.   obese  Lab Results  Component Value Date   WBC 10.3 05/12/2018   HGB 14.6 05/12/2018   HCT 45.0 05/12/2018   PLT 283 05/12/2018   GLUCOSE 142 (H) 05/12/2018   CHOL 206 (H) 12/02/2017   TRIG 178.0 (H) 12/02/2017   HDL 41.20 12/02/2017   LDLCALC 130 (H) 12/02/2017   ALT 38 12/02/2017   AST 27 12/02/2017   NA 141 05/12/2018   K 3.4 (L) 05/12/2018   CL 103 05/12/2018   CREATININE 0.70 05/12/2018   BUN 13 05/12/2018   CO2 30 05/12/2018   TSH 3.20 12/02/2017   PSA 0.66 12/02/2017   HGBA1C 8.8 (H) 03/13/2016    No results found.  Assessment & Plan:   Diagnoses and all orders for this visit:  Need for influenza vaccination -     Flu Vaccine QUAD 6+ mos PF IM (Fluarix Quad PF)     No orders of the defined types were placed in this encounter.    Follow-up: No follow-ups on  file.  Walker Kehr, MD

## 2018-06-25 NOTE — Assessment & Plan Note (Addendum)
cardiac CT scan for calcium scoring offered   Losartan HCT - d/c Micardis HCT

## 2018-06-25 NOTE — Patient Instructions (Signed)

## 2018-06-25 NOTE — Assessment & Plan Note (Signed)
cardiac CT scan for calcium scoring offered 

## 2018-06-25 NOTE — Assessment & Plan Note (Signed)
cardiac CT scan for calcium scoring offered  Lipitor

## 2018-07-06 ENCOUNTER — Other Ambulatory Visit: Payer: Self-pay | Admitting: Internal Medicine

## 2018-07-06 MED FILL — SILDENAFIL CITRATE 100 MG T: 100 | 30 days supply | Qty: 6 | Fill #3

## 2018-07-06 MED FILL — ATORVASTATIN 40 MG TABLET: 40 | 30 days supply | Qty: 30 | Fill #4

## 2018-07-07 MED FILL — LOSARTAN-HCTZ 100-25 MG TAB: 100-25 | 30 days supply | Qty: 30 | Fill #4

## 2018-07-15 MED FILL — ACCU-CHEK GUIDE TEST STRIP: 33 days supply | Qty: 100 | Fill #3

## 2018-07-15 MED FILL — GLIMEPIRIDE 4 MG TABLET: 4 | 90 days supply | Qty: 180 | Fill #2

## 2018-07-29 LAB — HM DIABETES EYE EXAM

## 2018-07-30 ENCOUNTER — Encounter: Payer: Self-pay | Admitting: Internal Medicine

## 2018-07-30 NOTE — Progress Notes (Signed)
Outside notes received. Information abstracted. Notes sent to scan.  

## 2018-08-03 ENCOUNTER — Other Ambulatory Visit: Payer: Self-pay | Admitting: Internal Medicine

## 2018-08-03 MED FILL — NORTRIPTYLINE HCL CAP 25 MG: 25 | 90 days supply | Qty: 180 | Fill #0

## 2018-08-03 MED FILL — ATORVASTATIN 40 MG TABLET: 40 | 30 days supply | Qty: 30 | Fill #5

## 2018-08-05 MED FILL — SILDENAFIL CITRATE 100 MG T: 100 | 30 days supply | Qty: 6 | Fill #4

## 2018-08-11 MED FILL — LOSARTAN POTASSIUM 100 MG T: 100 | 30 days supply | Qty: 30 | Fill #0

## 2018-08-11 MED FILL — HYDROCHLOROTHIAZIDE 25 MG T: 25 | 90 days supply | Qty: 90 | Fill #0

## 2018-08-25 MED FILL — SILDENAFIL CITRATE 100 MG T: 100 | 30 days supply | Qty: 6 | Fill #5

## 2018-08-26 MED FILL — FUROSEMIDE 20 MG TABS: 20 | 90 days supply | Qty: 180 | Fill #1

## 2018-08-31 MED FILL — ACCU-CHEK GUIDE TEST STRIP: 33 days supply | Qty: 100 | Fill #0

## 2018-08-31 MED FILL — TRULICITY 0.75 MG/0.5 ML PE: 0.75 | 28 days supply | Qty: 2 | Fill #0

## 2018-09-03 ENCOUNTER — Other Ambulatory Visit: Payer: Self-pay | Admitting: Internal Medicine

## 2018-09-03 MED FILL — ATORVASTATIN 40 MG TABLET: 40 | 30 days supply | Qty: 30 | Fill #0

## 2018-09-11 MED FILL — metFORMIN HCL ER 500 MG TB2: 500 | 90 days supply | Qty: 360 | Fill #2

## 2018-09-12 MED FILL — LANTUS SOLOSTAR 100 UNITS/M: 100 | 90 days supply | Qty: 135 | Fill #3

## 2018-09-17 MED FILL — LOSARTAN-HCTZ 100-25 MG TAB: 100-25 | 30 days supply | Qty: 30 | Fill #5

## 2018-09-22 MED FILL — ACCU-CHEK GUIDE TEST STRIP: 33 days supply | Qty: 100 | Fill #1

## 2018-09-22 MED FILL — CITALOPRAM HBR 10 MG TABLET: 10 | 90 days supply | Qty: 180 | Fill #0

## 2018-09-23 MED FILL — buPROPion HCL ER (XL) 150 M: 150 | 90 days supply | Qty: 90 | Fill #3

## 2018-09-29 MED FILL — TRULICITY 0.75 MG/0.5 ML PE: 0.75 | 28 days supply | Qty: 2 | Fill #1

## 2018-10-05 MED FILL — ATORVASTATIN 40 MG TABLET: 40 | 30 days supply | Qty: 30 | Fill #1

## 2018-10-14 MED FILL — LOSARTAN-HCTZ 100-25 MG TAB: 100-25 | 30 days supply | Qty: 30 | Fill #6

## 2018-10-14 MED FILL — GLIMEPIRIDE 4 MG TABLET: 4 | 90 days supply | Qty: 180 | Fill #3

## 2018-10-28 MED FILL — SILDENAFIL CITRATE 100 MG T: 100 | 30 days supply | Qty: 6 | Fill #6

## 2018-10-29 MED FILL — TRULICITY 0.75 MG/0.5 ML PE: 0.75 | 28 days supply | Qty: 2 | Fill #0

## 2018-10-30 MED FILL — NORTRIPTYLINE HCL CAP 25 MG: 25 | 90 days supply | Qty: 180 | Fill #1

## 2018-11-07 MED FILL — ATORVASTATIN 40 MG TABLET: 40 | 30 days supply | Qty: 30 | Fill #2

## 2018-11-18 MED FILL — LOSARTAN-HCTZ 100-25 MG TAB: 100-25 | 30 days supply | Qty: 30 | Fill #0

## 2018-11-25 MED FILL — FUROSEMIDE 20 MG TABS: 20 | 90 days supply | Qty: 180 | Fill #2

## 2018-11-26 MED FILL — TRULICITY 0.75 MG/0.5 ML PE: 0.75 | 28 days supply | Qty: 2 | Fill #1

## 2018-12-03 MED FILL — ATORVASTATIN 40 MG TABLET: 40 | 30 days supply | Qty: 30 | Fill #3

## 2018-12-04 MED FILL — ACCU-CHEK GUIDE TEST STRIP: 33 days supply | Qty: 100 | Fill #2

## 2018-12-11 MED FILL — LANTUS SOLOSTAR 100 UNITS/M: 100 | 90 days supply | Qty: 135 | Fill #4

## 2018-12-12 MED FILL — LOSARTAN-HCTZ 100-25 MG TAB: 100-25 | 30 days supply | Qty: 30 | Fill #1

## 2018-12-17 MED FILL — SILDENAFIL CITRATE 100 MG T: 100 | 30 days supply | Qty: 6 | Fill #7

## 2018-12-18 MED FILL — METFORMIN HCL ER 500 MG TB2: 500 | 30 days supply | Qty: 120 | Fill #3

## 2018-12-21 MED FILL — CITALOPRAM HBR 10 MG TABLET: 10 | 90 days supply | Qty: 180 | Fill #1

## 2018-12-28 ENCOUNTER — Other Ambulatory Visit: Payer: Self-pay | Admitting: Internal Medicine

## 2018-12-28 MED FILL — TRULICITY 0.75 MG/0.5 ML PE: 0.75 | 28 days supply | Qty: 2 | Fill #2

## 2018-12-28 MED FILL — AMLODIPINE BESYLATE 10 MG T: 10 | 90 days supply | Qty: 90 | Fill #0

## 2018-12-28 MED FILL — buPROPion HCL ER (XL) 150 M: 150 | 90 days supply | Qty: 90 | Fill #0

## 2019-01-07 MED FILL — LOSARTAN-HCTZ 100-25 MG TAB: 100-25 | 30 days supply | Qty: 30 | Fill #2

## 2019-01-07 MED FILL — ATORVASTATIN 40 MG TABLET: 40 | 90 days supply | Qty: 90 | Fill #4

## 2019-01-08 MED FILL — GLIMEPIRIDE 4 MG TABS: 4 | 90 days supply | Qty: 180 | Fill #0

## 2019-01-12 ENCOUNTER — Other Ambulatory Visit: Payer: Self-pay | Admitting: Internal Medicine

## 2019-01-13 MED FILL — SILDENAFIL CITRATE 100 MG T: 100 | 60 days supply | Qty: 12 | Fill #0

## 2019-01-14 MED FILL — metFORMIN HCL ER 500 MG TB2: 500 | 30 days supply | Qty: 120 | Fill #4

## 2019-01-30 ENCOUNTER — Other Ambulatory Visit: Payer: Self-pay | Admitting: Internal Medicine

## 2019-01-30 MED FILL — TRULICITY 0.75 MG/0.5 ML PE: 0.75 | 28 days supply | Qty: 2 | Fill #3

## 2019-02-01 MED FILL — NORTRIPTYLINE HCL CAP 25 MG: 25 | 90 days supply | Qty: 180 | Fill #0

## 2019-02-09 MED FILL — LOSARTAN-HCTZ 100-25 MG TAB: 100-25 | 30 days supply | Qty: 30 | Fill #3

## 2019-02-09 MED FILL — metFORMIN HCL ER 500 MG TB2: 500 | 30 days supply | Qty: 120 | Fill #5

## 2019-02-25 MED FILL — FUROSEMIDE 20 MG TABS: 20 | 90 days supply | Qty: 180 | Fill #3

## 2019-03-03 MED FILL — LANTUS SOLOSTAR 100 UNITS/M: 100 | 28 days supply | Qty: 30 | Fill #0

## 2019-03-15 MED FILL — LOSARTAN-HCTZ 100-25 MG TAB: 100-25 | 30 days supply | Qty: 30 | Fill #4

## 2019-03-17 ENCOUNTER — Other Ambulatory Visit: Payer: Self-pay | Admitting: Internal Medicine

## 2019-03-18 MED FILL — SILDENAFIL CITRATE 100 MG T: 100 | 60 days supply | Qty: 12 | Fill #1

## 2019-03-18 MED FILL — CITALOPRAM HBR 10 MG TABLET: 10 | 90 days supply | Qty: 180 | Fill #0

## 2019-03-22 ENCOUNTER — Other Ambulatory Visit: Payer: Self-pay | Admitting: Internal Medicine

## 2019-03-22 MED FILL — metFORMIN HCL ER 500 MG TB2: 500 | 90 days supply | Qty: 360 | Fill #0

## 2019-03-31 ENCOUNTER — Other Ambulatory Visit: Payer: Self-pay | Admitting: Internal Medicine

## 2019-03-31 MED FILL — buPROPion HCL ER (XL) 150 M: 150 | 30 days supply | Qty: 30 | Fill #0

## 2019-03-31 MED FILL — TRULICITY 0.75 MG/0.5 ML PE: 0.75 | 28 days supply | Qty: 2 | Fill #4

## 2019-04-08 MED FILL — ATORVASTATIN 40 MG TABLET: 40 | 90 days supply | Qty: 90 | Fill #5

## 2019-04-08 MED FILL — GLIMEPIRIDE 4 MG TABLET: 4 | 90 days supply | Qty: 180 | Fill #1

## 2019-04-08 MED FILL — LANTUS SOLOSTAR 100 UNITS/M: 100 | 28 days supply | Qty: 30 | Fill #1

## 2019-04-15 MED FILL — LOSARTAN-HCTZ 100-25 MG TAB: 100-25 | 30 days supply | Qty: 30 | Fill #5

## 2019-05-03 ENCOUNTER — Other Ambulatory Visit: Payer: Self-pay | Admitting: Internal Medicine

## 2019-05-03 MED FILL — buPROPion HCL ER (XL) 150 M: 150 | 30 days supply | Qty: 30 | Fill #0

## 2019-05-03 MED FILL — NORTRIPTYLINE HCL CAP 25 MG: 25 | 90 days supply | Qty: 180 | Fill #1

## 2019-05-06 MED FILL — LANTUS SOLOSTAR 100 UNITS/M: 100 | 28 days supply | Qty: 30 | Fill #2

## 2019-05-07 ENCOUNTER — Other Ambulatory Visit: Payer: Self-pay

## 2019-05-07 ENCOUNTER — Ambulatory Visit (INDEPENDENT_AMBULATORY_CARE_PROVIDER_SITE_OTHER): Payer: No Typology Code available for payment source

## 2019-05-07 DIAGNOSIS — Z23 Encounter for immunization: Secondary | ICD-10-CM | POA: Diagnosis not present

## 2019-05-14 MED FILL — TRULICITY 0.75 MG/0.5 ML PE: 0.75 | 28 days supply | Qty: 2 | Fill #5

## 2019-05-20 MED FILL — LOSARTAN-HCTZ 100-25 MG TAB: 100-25 | 30 days supply | Qty: 30 | Fill #6

## 2019-05-31 ENCOUNTER — Other Ambulatory Visit: Payer: Self-pay | Admitting: Internal Medicine

## 2019-05-31 MED FILL — LANTUS SOLOSTAR 100 UNITS/M: 100 | 28 days supply | Qty: 30 | Fill #3

## 2019-06-02 MED FILL — FUROSEMIDE 20 MG TABS: 20 | 30 days supply | Qty: 60 | Fill #0

## 2019-06-16 ENCOUNTER — Other Ambulatory Visit: Payer: Self-pay | Admitting: Internal Medicine

## 2019-06-16 MED FILL — metFORMIN HCL ER 500 MG TB2: 500 | 90 days supply | Qty: 360 | Fill #1

## 2019-06-17 MED FILL — CITALOPRAM HBR 10 MG TABLET: 10 | 30 days supply | Qty: 60 | Fill #0

## 2019-06-17 MED FILL — AMLODIPINE BESYLATE 10 MG T: 10 | 30 days supply | Qty: 30 | Fill #0

## 2019-06-21 MED FILL — SILDENAFIL CITRATE 100 MG T: 100 | 12 days supply | Qty: 12 | Fill #0

## 2019-06-22 MED FILL — LANTUS SOLOSTAR 100 UNITS/M: 100 | 28 days supply | Qty: 30 | Fill #4

## 2019-06-25 MED FILL — LOSARTAN-HCTZ 100-25 MG TAB: 100-25 | 30 days supply | Qty: 30 | Fill #7

## 2019-07-05 MED FILL — FREESTYLE LANCETS: 90 days supply | Qty: 300 | Fill #0

## 2019-07-05 MED FILL — FREESTYLE LITE METER: 1 days supply | Qty: 1 | Fill #0

## 2019-07-05 MED FILL — buPROPion HCL ER (XL) 150 M: 150 | 30 days supply | Qty: 30 | Fill #1

## 2019-07-05 MED FILL — TRULICITY 0.75 MG/0.5 ML PE: 0.75 | 84 days supply | Qty: 6 | Fill #0

## 2019-07-05 MED FILL — GLIMEPIRIDE 4 MG TABLET: 4 | 90 days supply | Qty: 180 | Fill #2

## 2019-07-05 MED FILL — FREESTYLE LITE TEST STRIP: 90 days supply | Qty: 300 | Fill #0

## 2019-07-12 MED FILL — ATORVASTATIN 40 MG TABLET: 40 | 60 days supply | Qty: 60 | Fill #6

## 2019-07-15 MED FILL — LANTUS SOLOSTAR 100 UNITS/M: 100 | 28 days supply | Qty: 30 | Fill #5

## 2019-07-21 ENCOUNTER — Other Ambulatory Visit: Payer: Self-pay | Admitting: Internal Medicine

## 2019-07-21 MED FILL — SILDENAFIL CITRATE 100 MG T: 100 | 12 days supply | Qty: 12 | Fill #1

## 2019-07-27 ENCOUNTER — Other Ambulatory Visit: Payer: Self-pay | Admitting: Internal Medicine

## 2019-07-28 ENCOUNTER — Other Ambulatory Visit: Payer: Self-pay | Admitting: Internal Medicine

## 2019-07-28 MED FILL — CITALOPRAM HBR 10 MG TABLET: 10 | 30 days supply | Qty: 60 | Fill #0

## 2019-07-28 MED FILL — LOSARTAN-HCTZ 100-25 MG TAB: 100-25 | 30 days supply | Qty: 30 | Fill #8

## 2019-07-29 MED FILL — FUROSEMIDE 20 MG TABS: 20 | 30 days supply | Qty: 60 | Fill #0

## 2019-08-05 ENCOUNTER — Other Ambulatory Visit: Payer: Self-pay | Admitting: Internal Medicine

## 2019-08-05 LAB — HM DIABETES EYE EXAM

## 2019-08-05 MED FILL — buPROPion HCL ER (XL) 150 M: 150 | 30 days supply | Qty: 30 | Fill #2

## 2019-08-06 MED FILL — NORTRIPTYLINE HCL CAP 25 MG: 25 | 90 days supply | Qty: 180 | Fill #0

## 2019-08-07 MED FILL — LANTUS SOLOSTAR 100 UNITS/M: 100 | 28 days supply | Qty: 30 | Fill #6

## 2019-08-10 ENCOUNTER — Encounter: Payer: Self-pay | Admitting: Internal Medicine

## 2019-08-10 ENCOUNTER — Ambulatory Visit (INDEPENDENT_AMBULATORY_CARE_PROVIDER_SITE_OTHER): Payer: No Typology Code available for payment source | Admitting: Internal Medicine

## 2019-08-10 ENCOUNTER — Other Ambulatory Visit: Payer: Self-pay

## 2019-08-10 ENCOUNTER — Other Ambulatory Visit: Payer: Self-pay | Admitting: Internal Medicine

## 2019-08-10 VITALS — BP 170/84 | HR 95 | Temp 98.3°F | Ht 72.0 in | Wt 308.0 lb

## 2019-08-10 DIAGNOSIS — E785 Hyperlipidemia, unspecified: Secondary | ICD-10-CM | POA: Diagnosis not present

## 2019-08-10 DIAGNOSIS — IMO0002 Reserved for concepts with insufficient information to code with codable children: Secondary | ICD-10-CM

## 2019-08-10 DIAGNOSIS — I10 Essential (primary) hypertension: Secondary | ICD-10-CM | POA: Diagnosis not present

## 2019-08-10 DIAGNOSIS — E1140 Type 2 diabetes mellitus with diabetic neuropathy, unspecified: Secondary | ICD-10-CM

## 2019-08-10 DIAGNOSIS — Z0000 Encounter for general adult medical examination without abnormal findings: Secondary | ICD-10-CM | POA: Diagnosis not present

## 2019-08-10 DIAGNOSIS — E1165 Type 2 diabetes mellitus with hyperglycemia: Secondary | ICD-10-CM

## 2019-08-10 MED ORDER — CITALOPRAM HYDROBROMIDE 10 MG PO TABS: 20.0000 mg | ORAL_TABLET | Freq: Every day | ORAL | 3 refills | Status: DC

## 2019-08-10 MED ORDER — TELMISARTAN-HCTZ 80-25 MG PO TABS: 1.0000 | ORAL_TABLET | Freq: Every day | ORAL | 3 refills | Status: DC

## 2019-08-10 MED ORDER — FUROSEMIDE 20 MG PO TABS: ORAL_TABLET | ORAL | 3 refills | Status: DC

## 2019-08-10 MED ORDER — ATORVASTATIN CALCIUM 40 MG PO TABS: 40.0000 mg | ORAL_TABLET | Freq: Every day | ORAL | 3 refills | Status: DC

## 2019-08-10 MED ORDER — BUPROPION HCL ER (XL) 150 MG PO TB24: ORAL_TABLET | ORAL | 3 refills | Status: DC

## 2019-08-10 MED ORDER — AMLODIPINE BESYLATE 10 MG PO TABS: 10.0000 mg | ORAL_TABLET | Freq: Every day | ORAL | 3 refills | Status: DC

## 2019-08-10 MED FILL — AMLODIPINE BESYLATE 10 MG T: 10 | 90 days supply | Qty: 90 | Fill #0

## 2019-08-10 MED FILL — TELMISARTAN-HCTZ 80-25 MG T: 80-25 | 90 days supply | Qty: 90 | Fill #0

## 2019-08-10 NOTE — Progress Notes (Signed)
Subjective:  Patient ID: Victor Byrd, male    DOB: 09-15-1965  Age: 54 y.o. MRN: [id number]  CC: No chief complaint on file.   HPI Victor Byrd presents for HTN, DM, obesity f/u Victor Byrd will tell me who he wants to see for colonioscopy Well exam  Outpatient Medications Prior to Visit  Medication Sig Dispense Refill  . albuterol (VENTOLIN HFA) 108 (90 BASE) MCG/ACT inhaler INHALE 2 PUFFS INTO THE LUNGS EVERY 6 HOURS AS NEEDED FOR WHEEZING. 3 Inhaler 3  . amLODipine (NORVASC) 10 MG tablet Take 1 tablet (10 mg total) by mouth daily. Annual appt is due w/labs must see provider for future refills 30 tablet 0  . aspirin 81 MG tablet Take 81 mg by mouth daily.      Victor Byrd atorvastatin (LIPITOR) 40 MG tablet TAKE 1 TABLET BY MOUTH ONCE DAILY 30 tablet 11  . buPROPion (WELLBUTRIN XL) 150 MG 24 hr tablet TAKE 1 TABLET BY MOUTH ONCE DAILY (OVERDUE FOR ANNUAL APPT MUST SEE PROVIDER FOR FUTURE REFILLS) 30 tablet 5  . Cholecalciferol (VITAMIN D3) 1000 UNITS tablet Take 1,000 Units by mouth daily.      . citalopram (CELEXA) 10 MG tablet Take 2 tablets (20 mg total) by mouth daily. Must keep upcoming appointment to get additional refills 60 tablet 0  . diclofenac (VOLTAREN) 75 MG EC tablet TAKE 1 TABLET BY MOUTH TWICE DAILY 30 tablet 1  . erythromycin (ROMYCIN) ophthalmic ointment Place 1 application into the right eye at bedtime. 3.5 g 0  . furosemide (LASIX) 20 MG tablet Take 1-2 Tablets by mouth once a day as needed. Must keep scheduled appt for future refills 60 tablet 0  . glimepiride (AMARYL) 4 MG tablet Take 1 tablet by mouth daily with breakfast.   6  . LANTUS SOLOSTAR 100 UNIT/ML Solostar Pen Inject 65 Units into the skin 2 (two) times daily. 75 units every morning, 75 units every evening  3  . losartan-hydrochlorothiazide (HYZAAR) 100-25 MG tablet Take 1 tablet by mouth daily.    . metformin (FORTAMET) 1000 MG (OSM) 24 hr tablet Take 1,000 mg by mouth 2 (two) times daily with a meal.    .  Multiple Vitamin (MULTIVITAMIN WITH MINERALS) TABS tablet Take 1 tablet by mouth daily.    . nortriptyline (PAMELOR) 25 MG capsule TAKE 1 TO 2 CAPSULES BY MOUTH AT BEDTIME 180 capsule 1  . sildenafil (VIAGRA) 100 MG tablet TAKE 1 TABLET BY MOUTH AS NEEDED FOR ERECTILE DYSFUNCTION. 12 tablet 3  . telmisartan-hydrochlorothiazide (MICARDIS HCT) 80-25 MG tablet Take 1 tablet by mouth daily. 90 tablet 3  . LOSARTAN POTASSIUM PO Take by mouth.    . INVOKAMET XR [phone] MG TB24   4   No facility-administered medications prior to visit.    ROS: Review of Systems  Constitutional: Positive for unexpected weight change. Negative for appetite change and fatigue.  HENT: Negative for congestion, nosebleeds, sneezing, sore throat and trouble swallowing.   Eyes: Negative for itching and visual disturbance.  Respiratory: Negative for cough.   Cardiovascular: Negative for chest pain, palpitations and leg swelling.  Gastrointestinal: Negative for abdominal distention, blood in stool, diarrhea and nausea.  Genitourinary: Negative for frequency and hematuria.  Musculoskeletal: Negative for back pain, gait problem, joint swelling and neck pain.  Skin: Negative for rash.  Neurological: Negative for dizziness, tremors, speech difficulty and weakness.  Psychiatric/Behavioral: Negative for agitation, dysphoric mood, sleep disturbance and suicidal ideas. The patient is nervous/anxious.  Objective:  BP (!) 170/84 (BP Location: Right Arm, Patient Position: Sitting, Cuff Size: Large)   Pulse 95   Temp 98.3 F (36.8 C) (Oral)   Ht 6' (1.829 m)   Wt (!) 308 lb (139.7 kg)   SpO2 97%   BMI 41.77 kg/m   BP Readings from Last 3 Encounters:  08/10/19 (!) 170/84  06/25/18 (!) 160/84  05/12/18 (!) 168/115    Wt Readings from Last 3 Encounters:  08/10/19 (!) 308 lb (139.7 kg)  06/25/18 (!) 303 lb (137.4 kg)  05/12/18 (!) 305 lb (138.3 kg)    Physical Exam Constitutional:      General: He is not in  acute distress.    Appearance: He is well-developed. He is obese.     Comments: NAD  Eyes:     Conjunctiva/sclera: Conjunctivae normal.     Pupils: Pupils are equal, round, and reactive to light.  Neck:     Thyroid: No thyromegaly.     Vascular: No JVD.  Cardiovascular:     Rate and Rhythm: Normal rate and regular rhythm.     Heart sounds: Normal heart sounds. No murmur. No friction rub. No gallop.   Pulmonary:     Effort: Pulmonary effort is normal. No respiratory distress.     Breath sounds: Normal breath sounds. No wheezing or rales.  Chest:     Chest wall: No tenderness.  Abdominal:     General: Bowel sounds are normal. There is no distension.     Palpations: Abdomen is soft. There is no mass.     Tenderness: There is no abdominal tenderness. There is no guarding or rebound.  Musculoskeletal:        General: No tenderness. Normal range of motion.     Cervical back: Normal range of motion.  Lymphadenopathy:     Cervical: No cervical adenopathy.  Skin:    General: Skin is warm and dry.     Findings: No rash.  Neurological:     Mental Status: He is alert and oriented to person, place, and time.     Cranial Nerves: No cranial nerve deficit.     Motor: No abnormal muscle tone.     Coordination: Coordination normal.     Gait: Gait normal.     Deep Tendon Reflexes: Reflexes are normal and symmetric.  Psychiatric:        Behavior: Behavior normal.        Thought Content: Thought content normal.        Judgment: Judgment normal.     Lab Results  Component Value Date   WBC 10.3 05/12/2018   HGB 14.6 05/12/2018   HCT 45.0 05/12/2018   PLT 283 05/12/2018   GLUCOSE 142 (H) 05/12/2018   CHOL 206 (H) 12/02/2017   TRIG 178.0 (H) 12/02/2017   HDL 41.20 12/02/2017   LDLCALC 130 (H) 12/02/2017   ALT 38 12/02/2017   AST 27 12/02/2017   NA 141 05/12/2018   K 3.4 (L) 05/12/2018   CL 103 05/12/2018   CREATININE 0.70 05/12/2018   BUN 13 05/12/2018   CO2 30 05/12/2018   TSH  3.20 12/02/2017   PSA 0.66 12/02/2017   HGBA1C 8.8 (H) 03/13/2016    No results found.  Assessment & Plan:     Victor Kehr, MD

## 2019-08-10 NOTE — Assessment & Plan Note (Signed)
Labs

## 2019-08-10 NOTE — Assessment & Plan Note (Addendum)
Check BP at home cardiac CT scan for calcium scoring offered

## 2019-08-10 NOTE — Assessment & Plan Note (Signed)
cardiac CT scan for calcium scoring offered 

## 2019-08-10 NOTE — Patient Instructions (Addendum)
FEMA mass vaccine site in [place]:  Call the [facility] at [phone] to schedule your shot at [facility].   [place] federal vaccine clinic is preparing to open on Wednesday 08/11/19. The FEMA site at [place] has the capacity to vaccinate 3,000 people a day for eight weeks. That's nearly 170,000 doses just from this one clinic.   With such a big operation, here are answers to some questions you may have about the drive-thru and indoor site:  How do I make an appointment?   Head to [email or web address] to schedule your appointment indoors or in the drive-thru, or call the [place] at [phone].  What if I need to change or cancel my appointment, or have more questions?   Call the [place] at [phone].  What vaccines will be available at the clinic?   The vaccine clinic will begin giving both Pfizer and Moderna two-dose COVID-19 vaccines. The single-dose [employer] vaccines will be given during the last two weeks of the clinic.   What part of the [place] do I enter for my appointment?  Enter from [employer] and turn onto [address]. A clinic staff member will confirm your appointment for the day. You'll then either be directed to registration or to a waiting area until your appointment time.  Can I be seen sooner if I'm early?  Those who are early will park in a designated waiting area until their vaccine appointment time approaches.  How does registration work?  There are five open lanes for registration. Someone will get the necessary information needed to confirm appointments and other details to keep a record of who's getting the vaccine every day. Temperatures will be checked to make sure you're in good shape to get the vaccine.  How long will it take to get my shot?  [place] park your car in a long tent with about 10  other vehicles. All 10 people in that group will get a shot inside their vehicles. Getting the actual shot only takes a few minutes. The entire process takes about 30 minutes.  How does the observation period work?  All patients will wait in the same tent they got their vaccine at for 15 minutes.      Cardiac CT calcium scoring test $150 Tel # is [phone]   Computed tomography, more commonly known as a CT or CAT scan, is a diagnostic medical imaging test. Like traditional x-rays, it produces multiple images or pictures of the inside of the body. The cross-sectional images generated during a CT scan can be reformatted in multiple planes. They can even generate three-dimensional images. These images can be viewed on a computer monitor, printed on film or by a 3D printer, or transferred to a CD or DVD. CT images of internal organs, bones, soft tissue and blood vessels provide greater detail than traditional x-rays, particularly of soft tissues and blood vessels. A cardiac CT scan for coronary calcium is a non-invasive way of obtaining information about the presence, location and extent of calcified plaque in the coronary arteries--the vessels that supply oxygen-containing blood to the heart muscle. Calcified plaque results when there is a build-up of fat and other substances under the inner layer of the artery. This material can calcify which signals the presence of atherosclerosis, a disease of the vessel wall, also called coronary artery disease (CAD). People with this disease have an increased  risk for heart attacks. In addition, over time, progression of plaque build up (CAD) can narrow the arteries or even close off blood flow to the heart. The result may be chest pain, sometimes called "angina," or a heart attack. Because calcium is a marker of CAD, the amount of calcium detected on a cardiac CT scan is a helpful prognostic tool. The findings on cardiac CT are expressed as a calcium score.  Another name for this test is coronary artery calcium scoring.  What are some common uses of the procedure? The goal of cardiac CT scan for calcium scoring is to determine if CAD is present and to what extent, even if there are no symptoms. It is a screening study that may be recommended by a physician for patients with risk factors for CAD but no clinical symptoms. The major risk factors for CAD are: . high blood cholesterol levels  . family history of heart attacks  . diabetes  . high blood pressure  . cigarette smoking  . overweight or obese  . physical inactivity   A negative cardiac CT scan for calcium scoring shows no calcification within the coronary arteries. This suggests that CAD is absent or so minimal it cannot be seen by this technique. The chance of having a heart attack over the next two to five years is very low under these circumstances. A positive test means that CAD is present, regardless of whether or not the patient is experiencing any symptoms. The amount of calcification--expressed as the calcium score--may help to predict the likelihood of a myocardial infarction (heart attack) in the coming years and helps your medical doctor or cardiologist decide whether the patient may need to take preventive medicine or undertake other measures such as diet and exercise to lower the risk for heart attack. The extent of CAD is graded according to your calcium score:  Calcium Score  Presence of CAD (coronary artery disease)  0 No evidence of CAD   1-10 Minimal evidence of CAD  11-100 Mild evidence of CAD  101-400 Moderate evidence of CAD  Over 400 Extensive evidence of CAD

## 2019-08-11 NOTE — Assessment & Plan Note (Addendum)
  We discussed age appropriate health related issues, including available/recomended screening tests and vaccinations. Labs were ordered to be later reviewed . All questions were answered. We discussed one or more of the following - seat belt use, use of sunscreen/sun exposure exercise, safe sex, fall risk reduction, second hand smoke exposure, firearm use and storage, seat belt use, a need for adhering to healthy diet and exercise. Labs were ordered or discussed if they are available. All questions were answered. Eduard Clos will tell me who he wants to see for colonioscopy

## 2019-08-12 ENCOUNTER — Other Ambulatory Visit (INDEPENDENT_AMBULATORY_CARE_PROVIDER_SITE_OTHER): Payer: No Typology Code available for payment source

## 2019-08-12 ENCOUNTER — Other Ambulatory Visit: Payer: Self-pay

## 2019-08-12 DIAGNOSIS — Z0000 Encounter for general adult medical examination without abnormal findings: Secondary | ICD-10-CM | POA: Diagnosis not present

## 2019-08-12 LAB — URINALYSIS
Bilirubin Urine: NEGATIVE
Hgb urine dipstick: NEGATIVE
Ketones, ur: NEGATIVE
Leukocytes,Ua: NEGATIVE
Nitrite: NEGATIVE
Specific Gravity, Urine: 1.02 (ref 1.000–1.030)
Urine Glucose: 500 — AB
Urobilinogen, UA: 0.2 (ref 0.0–1.0)
pH: 7 (ref 5.0–8.0)

## 2019-08-12 LAB — HEPATIC FUNCTION PANEL
ALT: 42 U/L (ref 0–53)
AST: 28 U/L (ref 0–37)
Albumin: 4 g/dL (ref 3.5–5.2)
Alkaline Phosphatase: 64 U/L (ref 39–117)
Bilirubin, Direct: 0.1 mg/dL (ref 0.0–0.3)
Total Bilirubin: 0.4 mg/dL (ref 0.2–1.2)
Total Protein: 7 g/dL (ref 6.0–8.3)

## 2019-08-12 LAB — CBC WITH DIFFERENTIAL/PLATELET
Basophils Absolute: 0.1 10*3/uL (ref 0.0–0.1)
Basophils Relative: 1.1 % (ref 0.0–3.0)
Eosinophils Absolute: 0.3 10*3/uL (ref 0.0–0.7)
Eosinophils Relative: 3.7 % (ref 0.0–5.0)
HCT: 41.6 % (ref 39.0–52.0)
Hemoglobin: 14.1 g/dL (ref 13.0–17.0)
Lymphocytes Relative: 29.4 % (ref 12.0–46.0)
Lymphs Abs: 2.5 10*3/uL (ref 0.7–4.0)
MCHC: 33.8 g/dL (ref 30.0–36.0)
MCV: 80.5 fl (ref 78.0–100.0)
Monocytes Absolute: 0.8 10*3/uL (ref 0.1–1.0)
Monocytes Relative: 8.8 % (ref 3.0–12.0)
Neutro Abs: 4.9 10*3/uL (ref 1.4–7.7)
Neutrophils Relative %: 57 % (ref 43.0–77.0)
Platelets: 261 10*3/uL (ref 150.0–400.0)
RBC: 5.17 Mil/uL (ref 4.22–5.81)
RDW: 13.6 % (ref 11.5–15.5)
WBC: 8.6 10*3/uL (ref 4.0–10.5)

## 2019-08-12 LAB — LIPID PANEL
Cholesterol: 141 mg/dL (ref 0–200)
HDL: 41.8 mg/dL (ref >39.00)
LDL Cholesterol: 75 mg/dL (ref 0–99)
NonHDL: 99.67
Total CHOL/HDL Ratio: 3
Triglycerides: 125 mg/dL (ref 0.0–149.0)
VLDL: 25 mg/dL (ref 0.0–40.0)

## 2019-08-12 LAB — PSA: PSA: 0.74 ng/mL (ref 0.10–4.00)

## 2019-08-23 ENCOUNTER — Encounter: Payer: Self-pay | Admitting: Internal Medicine

## 2019-08-24 MED FILL — FUROSEMIDE 20 MG TABS: 20 | 90 days supply | Qty: 180 | Fill #0

## 2019-08-30 ENCOUNTER — Other Ambulatory Visit (HOSPITAL_COMMUNITY): Payer: Self-pay | Admitting: Endocrinology

## 2019-08-30 MED FILL — CITALOPRAM HBR 10 MG TABLET: 10 | 45 days supply | Qty: 90 | Fill #0

## 2019-09-02 ENCOUNTER — Other Ambulatory Visit (HOSPITAL_COMMUNITY): Payer: Self-pay | Admitting: Endocrinology

## 2019-09-02 MED FILL — BASAGLAR 100 UNIT/ML KWIKPE: 100 | 28 days supply | Qty: 30 | Fill #0

## 2019-09-03 ENCOUNTER — Ambulatory Visit: Payer: No Typology Code available for payment source | Attending: Internal Medicine

## 2019-09-03 DIAGNOSIS — Z23 Encounter for immunization: Secondary | ICD-10-CM

## 2019-09-03 NOTE — Progress Notes (Signed)
   Covid-19 Vaccination Clinic  Name:  Victor Byrd    MRN: [id number] DOB: 09/02/1965  09/03/2019  [name] was observed post Covid-19 immunization for 15 minutes without incident. He was provided with Vaccine Information Sheet and instruction to access the V-Safe system.   [name] was instructed to call 911 with any severe reactions post vaccine: Marland Kitchen Difficulty breathing  . Swelling of face and throat  . A fast heartbeat  . A bad rash all over body  . Dizziness and weakness   Immunizations Administered    Name Date Dose VIS Date Route   Pfizer COVID-19 Vaccine 09/03/2019 12:20 PM 0.3 mL 05/14/2019 Intramuscular   Manufacturer: [employer], [employer]   Lot: [id number]   [place]: [id number]

## 2019-09-06 ENCOUNTER — Telehealth: Payer: Self-pay | Admitting: Internal Medicine

## 2019-09-06 NOTE — Telephone Encounter (Signed)
New message:   Pt is calling and states he needs a referral per his insurance and he needs the referral to be for a specific dr. Pt would also like a call when the referral has been placed. Please advise.

## 2019-09-09 NOTE — Telephone Encounter (Signed)
Unable to reach pt  Where is the referral for and what provider?

## 2019-09-14 ENCOUNTER — Other Ambulatory Visit: Payer: Self-pay | Admitting: Internal Medicine

## 2019-09-14 MED FILL — metFORMIN HCL ER 500 MG TB2: 500 | 90 days supply | Qty: 360 | Fill #2

## 2019-09-14 MED FILL — buPROPion HCL ER (XL) 150 M: 150 | 30 days supply | Qty: 30 | Fill #3

## 2019-09-14 MED FILL — ATORVASTATIN 40 MG TABLET: 40 | 60 days supply | Qty: 60 | Fill #0

## 2019-09-23 ENCOUNTER — Other Ambulatory Visit (HOSPITAL_COMMUNITY): Payer: Self-pay | Admitting: Endocrinology

## 2019-09-27 MED FILL — BASAGLAR 100 UNIT/ML KWIKPE: 100 | 20 days supply | Qty: 30 | Fill #1

## 2019-09-28 ENCOUNTER — Ambulatory Visit: Payer: No Typology Code available for payment source | Attending: Internal Medicine

## 2019-09-28 DIAGNOSIS — Z23 Encounter for immunization: Secondary | ICD-10-CM

## 2019-09-28 NOTE — Progress Notes (Signed)
   Covid-19 Vaccination Clinic  Name:  Victor Byrd    MRN: [id number] DOB: 1965/06/07  09/28/2019  [name] was observed post Covid-19 immunization for 15 minutes without incident. He was provided with Vaccine Information Sheet and instruction to access the V-Safe system.   [name] was instructed to call 911 with any severe reactions post vaccine: Marland Kitchen Difficulty breathing  . Swelling of face and throat  . A fast heartbeat  . A bad rash all over body  . Dizziness and weakness   Immunizations Administered    Name Date Dose VIS Date Route   Pfizer COVID-19 Vaccine 09/28/2019  4:08 PM 0.3 mL 07/28/2018 Intramuscular   Manufacturer: [place]   Lot: [id number]   [place]: [id number]

## 2019-10-07 ENCOUNTER — Other Ambulatory Visit: Payer: Self-pay | Admitting: Internal Medicine

## 2019-10-08 MED FILL — SILDENAFIL CITRATE 100 MG T: 100 | 60 days supply | Qty: 12 | Fill #0

## 2019-10-08 MED FILL — GLIMEPIRIDE 4 MG TABS: 4 | 90 days supply | Qty: 180 | Fill #3

## 2019-10-18 MED FILL — BASAGLAR 100 UNIT/ML KWIKPE: 100 | 20 days supply | Qty: 30 | Fill #2

## 2019-11-03 MED FILL — NORTRIPTYLINE HCL CAP 25 MG: 25 | 90 days supply | Qty: 180 | Fill #1

## 2019-11-17 MED FILL — buPROPion HCL ER (XL) 150 M: 150 | 30 days supply | Qty: 30 | Fill #5

## 2019-11-17 MED FILL — FUROSEMIDE 20 MG TABS: 20 | 90 days supply | Qty: 180 | Fill #1

## 2019-11-18 MED FILL — ATORVASTATIN CALCIUM 40 MG: 40 | 60 days supply | Qty: 60 | Fill #1

## 2019-11-18 MED FILL — TELMISARTAN-HCTZ 80-25 MG T: 80-25 | 90 days supply | Qty: 90 | Fill #1

## 2019-11-30 MED FILL — SILDENAFIL CITRATE 100 MG T: 100 | 60 days supply | Qty: 12 | Fill #1

## 2019-12-03 MED FILL — FREESTYLE LITE TEST STRIP: 90 days supply | Qty: 300 | Fill #1

## 2019-12-07 MED FILL — CITALOPRAM HBR 10 MG TABLET: 10 | 45 days supply | Qty: 90 | Fill #2

## 2019-12-08 MED FILL — BASAGLAR 100 UNIT/ML KWIKPE: 100 | 30 days supply | Qty: 45 | Fill #1

## 2019-12-17 ENCOUNTER — Other Ambulatory Visit (HOSPITAL_COMMUNITY): Payer: Self-pay | Admitting: Endocrinology

## 2019-12-21 MED FILL — buPROPion HCL ER (XL) 150 M: 150 | 90 days supply | Qty: 90 | Fill #0

## 2019-12-21 MED FILL — metFORMIN HCL ER 500 MG TB2: 500 | 90 days supply | Qty: 360 | Fill #3

## 2020-01-13 ENCOUNTER — Other Ambulatory Visit (HOSPITAL_COMMUNITY): Payer: Self-pay | Admitting: Endocrinology

## 2020-01-13 MED FILL — GLIMEPIRIDE 4 MG TABS: 4 | 90 days supply | Qty: 180 | Fill #0

## 2020-01-17 MED FILL — BASAGLAR 100 UNIT/ML KWIKPE: 100 | 30 days supply | Qty: 45 | Fill #2

## 2020-01-19 MED FILL — ATORVASTATIN CALCIUM 40 MG: 40 | 90 days supply | Qty: 90 | Fill #2

## 2020-01-19 MED FILL — CITALOPRAM HBR 10 MG TABLET: 10 | 45 days supply | Qty: 90 | Fill #3

## 2020-01-21 MED FILL — TRULICITY 1.5 MG/0.5 ML PEN: 1.5 | 84 days supply | Qty: 6 | Fill #0

## 2020-02-02 ENCOUNTER — Other Ambulatory Visit: Payer: Self-pay | Admitting: Internal Medicine

## 2020-02-02 MED FILL — SILDENAFIL CITRATE 100 MG T: 100 | 60 days supply | Qty: 12 | Fill #0

## 2020-02-04 ENCOUNTER — Other Ambulatory Visit: Payer: Self-pay | Admitting: Internal Medicine

## 2020-02-04 NOTE — Telephone Encounter (Signed)
Patient is calling to see if this is going to be called in before the day is over.  Advised of 72 hour turn around.

## 2020-02-08 MED FILL — NORTRIPTYLINE HCL CAP 25 MG: 25 | 90 days supply | Qty: 180 | Fill #0

## 2020-02-14 MED FILL — AMLODIPINE BESYLATE 10 MG T: 10 | 90 days supply | Qty: 90 | Fill #1

## 2020-02-21 MED FILL — BASAGLAR 100 UNIT/ML KWIKPE: 100 | 30 days supply | Qty: 45 | Fill #3

## 2020-02-22 MED FILL — FUROSEMIDE 20 MG TABS: 20 | 90 days supply | Qty: 180 | Fill #2

## 2020-03-09 ENCOUNTER — Other Ambulatory Visit: Payer: Self-pay | Admitting: Internal Medicine

## 2020-03-09 MED FILL — CITALOPRAM HBR 10 MG TABLET: 10 | 90 days supply | Qty: 180 | Fill #0

## 2020-03-17 MED FILL — buPROPion HCL ER (XL) 150 M: 150 | 90 days supply | Qty: 90 | Fill #1

## 2020-03-17 MED FILL — metFORMIN HCL ER 500 MG TB2: 500 | 90 days supply | Qty: 360 | Fill #4

## 2020-03-25 MED FILL — BASAGLAR 100 UNIT/ML KWIKPE: 100 | 30 days supply | Qty: 45 | Fill #4

## 2020-04-05 MED FILL — SILDENAFIL CITRATE 100 MG T: 100 | 60 days supply | Qty: 12 | Fill #1

## 2020-04-11 MED FILL — GLIMEPIRIDE 4 MG TABS: 4 | 90 days supply | Qty: 180 | Fill #1

## 2020-04-17 MED FILL — TRULICITY 1.5 MG/0.5 ML PEN: 1.5 | 84 days supply | Qty: 6 | Fill #1

## 2020-04-19 MED FILL — ATORVASTATIN 40 MG TABLET: 40 | 90 days supply | Qty: 90 | Fill #3

## 2020-05-03 ENCOUNTER — Ambulatory Visit (INDEPENDENT_AMBULATORY_CARE_PROVIDER_SITE_OTHER): Payer: No Typology Code available for payment source | Admitting: Internal Medicine

## 2020-05-03 ENCOUNTER — Encounter: Payer: Self-pay | Admitting: Internal Medicine

## 2020-05-03 ENCOUNTER — Other Ambulatory Visit: Payer: Self-pay | Admitting: Internal Medicine

## 2020-05-03 ENCOUNTER — Other Ambulatory Visit: Payer: Self-pay

## 2020-05-03 VITALS — BP 144/82 | HR 104 | Temp 98.8°F | Wt 305.0 lb

## 2020-05-03 DIAGNOSIS — R351 Nocturia: Secondary | ICD-10-CM | POA: Insufficient documentation

## 2020-05-03 DIAGNOSIS — E1140 Type 2 diabetes mellitus with diabetic neuropathy, unspecified: Secondary | ICD-10-CM | POA: Diagnosis not present

## 2020-05-03 DIAGNOSIS — Z6841 Body Mass Index (BMI) 40.0 and over, adult: Secondary | ICD-10-CM

## 2020-05-03 DIAGNOSIS — Z23 Encounter for immunization: Secondary | ICD-10-CM

## 2020-05-03 DIAGNOSIS — E1165 Type 2 diabetes mellitus with hyperglycemia: Secondary | ICD-10-CM

## 2020-05-03 DIAGNOSIS — IMO0002 Reserved for concepts with insufficient information to code with codable children: Secondary | ICD-10-CM

## 2020-05-03 DIAGNOSIS — I10 Essential (primary) hypertension: Secondary | ICD-10-CM | POA: Diagnosis not present

## 2020-05-03 MED ORDER — DOXAZOSIN MESYLATE 2 MG PO TABS: 2.0000 mg | ORAL_TABLET | Freq: Every day | ORAL | 3 refills | Status: DC

## 2020-05-03 MED ORDER — NORTRIPTYLINE HCL 50 MG PO CAPS: 50.0000 mg | ORAL_CAPSULE | Freq: Every day | ORAL | 3 refills | Status: DC

## 2020-05-03 MED FILL — BASAGLAR 100 UNIT/ML KWIKPE: 100 | 30 days supply | Qty: 45 | Fill #3

## 2020-05-03 MED FILL — DOXAZOSIN MESYLATE 2 MG TAB: 2 | 90 days supply | Qty: 90 | Fill #0

## 2020-05-03 MED FILL — NORTRIPTYLINE HCL 50 MG CAP: 50 | 90 days supply | Qty: 180 | Fill #0

## 2020-05-03 NOTE — Assessment & Plan Note (Signed)
Worse UA DM control

## 2020-05-03 NOTE — Assessment & Plan Note (Signed)
F/u w/Dr Balan 

## 2020-05-03 NOTE — Progress Notes (Signed)
Subjective:  Patient ID: Victor Byrd, male    DOB: 31-Jul-1965  Age: 54 y.o. MRN: [id number]  CC: Medication Problem (Pt states he think his Nortriptyline no longer works. He been wakinbg up at night at least 3-4 times)   HPI Victor Byrd presents for HTN, noturia x 3 per night - worse Follow-up on diabetes, Eduard Clos has been unable to lose weight   Outpatient Medications Prior to Visit  Medication Sig Dispense Refill  . albuterol (VENTOLIN HFA) 108 (90 BASE) MCG/ACT inhaler INHALE 2 PUFFS INTO THE LUNGS EVERY 6 HOURS AS NEEDED FOR WHEEZING. 3 Inhaler 3  . amLODipine (NORVASC) 10 MG tablet Take 1 tablet (10 mg total) by mouth daily. Annual appt is due w/labs must see provider for future refills 90 tablet 3  . aspirin 81 MG tablet Take 81 mg by mouth daily.      Marland Kitchen atorvastatin (LIPITOR) 40 MG tablet TAKE 1 TABLET BY MOUTH ONCE DAILY 60 tablet 11  . buPROPion (WELLBUTRIN XL) 150 MG 24 hr tablet TAKE 1 TABLET BY MOUTH ONCE DAILY (OVERDUE FOR ANNUAL APPT MUST SEE PROVIDER FOR FUTURE REFILLS) 90 tablet 3  . Cholecalciferol (VITAMIN D3) 1000 UNITS tablet Take 1,000 Units by mouth daily.      . citalopram (CELEXA) 10 MG tablet Take 2 tablets (20 mg total) by mouth daily. 180 tablet 1  . diclofenac (VOLTAREN) 75 MG EC tablet TAKE 1 TABLET BY MOUTH TWICE DAILY 30 tablet 1  . furosemide (LASIX) 20 MG tablet Take 1-2 Tablets by mouth once a day as needed. Must keep scheduled appt for future refills 180 tablet 3  . glimepiride (AMARYL) 4 MG tablet Take 1 tablet by mouth daily with breakfast.   6  . LANTUS SOLOSTAR 100 UNIT/ML Solostar Pen Inject 65 Units into the skin 2 (two) times daily. 75 units every morning, 75 units every evening  3  . metformin (FORTAMET) 1000 MG (OSM) 24 hr tablet Take 1,000 mg by mouth 2 (two) times daily with a meal.    . Multiple Vitamin (MULTIVITAMIN WITH MINERALS) TABS tablet Take 1 tablet by mouth daily.    . nortriptyline (PAMELOR) 25 MG capsule TAKE 1 TO 2 CAPSULES  BY MOUTH AT BEDTIME 180 capsule 1  . sildenafil (VIAGRA) 100 MG tablet TAKE 1 TABLET BY MOUTH ONCE DAILY AS NEEDED FOR ERECTILE DYSFUNCTION 12 tablet 3  . telmisartan-hydrochlorothiazide (MICARDIS HCT) 80-25 MG tablet Take 1 tablet by mouth daily. 90 tablet 3  . erythromycin (ROMYCIN) ophthalmic ointment Place 1 application into the right eye at bedtime. (Patient not taking: Reported on 05/03/2020) 3.5 g 0   No facility-administered medications prior to visit.    ROS: Review of Systems  Constitutional: Positive for fatigue. Negative for appetite change and unexpected weight change.  HENT: Negative for congestion, nosebleeds, sneezing, sore throat and trouble swallowing.   Eyes: Negative for itching and visual disturbance.  Respiratory: Negative for cough and shortness of breath.   Cardiovascular: Positive for leg swelling. Negative for chest pain and palpitations.  Gastrointestinal: Negative for abdominal distention, blood in stool, diarrhea and nausea.  Genitourinary: Positive for frequency and urgency. Negative for hematuria.  Musculoskeletal: Negative for back pain, gait problem, joint swelling and neck pain.  Skin: Negative for rash.  Neurological: Negative for dizziness, tremors, speech difficulty and weakness.  Psychiatric/Behavioral: Negative for agitation, dysphoric mood and sleep disturbance. The patient is not nervous/anxious.     Objective:  BP (!) 144/82 (BP Location: Left  Arm)   Pulse (!) 104   Temp 98.8 F (37.1 C) (Oral)   Wt (!) 305 lb (138.3 kg)   SpO2 96%   BMI 41.37 kg/m   BP Readings from Last 3 Encounters:  05/03/20 (!) 144/82  08/10/19 (!) 170/84  06/25/18 (!) 160/84    Wt Readings from Last 3 Encounters:  05/03/20 (!) 305 lb (138.3 kg)  08/10/19 (!) 308 lb (139.7 kg)  06/25/18 (!) 303 lb (137.4 kg)    Physical Exam Constitutional:      General: He is not in acute distress.    Appearance: He is well-developed. He is obese.     Comments: NAD   Eyes:     Conjunctiva/sclera: Conjunctivae normal.     Pupils: Pupils are equal, round, and reactive to light.  Neck:     Thyroid: No thyromegaly.     Vascular: No JVD.  Cardiovascular:     Rate and Rhythm: Normal rate and regular rhythm.     Heart sounds: Normal heart sounds. No murmur heard.  No friction rub. No gallop.   Pulmonary:     Effort: Pulmonary effort is normal. No respiratory distress.     Breath sounds: Normal breath sounds. No wheezing or rales.  Chest:     Chest wall: No tenderness.  Abdominal:     General: Bowel sounds are normal. There is no distension.     Palpations: Abdomen is soft. There is no mass.     Tenderness: There is no abdominal tenderness. There is no guarding or rebound.  Musculoskeletal:        General: No tenderness. Normal range of motion.     Cervical back: Normal range of motion.     Right lower leg: Edema present.     Left lower leg: Edema present.  Lymphadenopathy:     Cervical: No cervical adenopathy.  Skin:    General: Skin is warm and dry.     Findings: No rash.  Neurological:     Mental Status: He is alert and oriented to person, place, and time.     Cranial Nerves: No cranial nerve deficit.     Motor: No abnormal muscle tone.     Coordination: Coordination normal.     Gait: Gait normal.     Deep Tendon Reflexes: Reflexes are normal and symmetric.  Psychiatric:        Mood and Affect: Mood normal.        Behavior: Behavior normal.        Thought Content: Thought content normal.        Judgment: Judgment normal.    1+ B ankle edema  Lab Results  Component Value Date   WBC 8.6 08/12/2019   HGB 14.1 08/12/2019   HCT 41.6 08/12/2019   PLT 261.0 08/12/2019   GLUCOSE 142 (H) 05/12/2018   CHOL 141 08/12/2019   TRIG 125.0 08/12/2019   HDL 41.80 08/12/2019   LDLCALC 75 08/12/2019   ALT 42 08/12/2019   AST 28 08/12/2019   NA 141 05/12/2018   K 3.4 (L) 05/12/2018   CL 103 05/12/2018   CREATININE 0.70 05/12/2018   BUN 13  05/12/2018   CO2 30 05/12/2018   TSH 3.20 12/02/2017   PSA 0.74 08/12/2019   HGBA1C 8.8 (H) 03/13/2016    No results found.  Assessment & Plan:    Walker Kehr, MD

## 2020-05-03 NOTE — Assessment & Plan Note (Addendum)
We discussed the necessity of weight loss.  Surgical options discussed.  Surg ref offered again now 12/21

## 2020-05-08 ENCOUNTER — Encounter: Payer: Self-pay | Admitting: Internal Medicine

## 2020-05-14 ENCOUNTER — Encounter: Payer: Self-pay | Admitting: Internal Medicine

## 2020-05-14 NOTE — Assessment & Plan Note (Signed)
Blood pressure has been controlled suboptimally.  Micardis HCT Furosemide, amlodipine

## 2020-06-06 MED FILL — CITALOPRAM HBR 10 MG TABLET: 10 | 90 days supply | Qty: 180 | Fill #1

## 2020-06-06 MED FILL — FUROSEMIDE 20 MG TABS: 20 | 90 days supply | Qty: 180 | Fill #3

## 2020-06-06 MED FILL — BASAGLAR 100 UNIT/ML KWIKPE: 100 | 30 days supply | Qty: 45 | Fill #4

## 2020-06-06 MED FILL — buPROPion HCL ER (XL) 150 M: 150 | 90 days supply | Qty: 90 | Fill #2

## 2020-06-15 ENCOUNTER — Other Ambulatory Visit (HOSPITAL_COMMUNITY): Payer: Self-pay | Admitting: Endocrinology

## 2020-06-16 MED FILL — METFORMIN HCL 1000 MG TABS: 1000 | 90 days supply | Qty: 180 | Fill #0

## 2020-06-27 MED FILL — SILDENAFIL CITRATE 100 MG T: 100 | 60 days supply | Qty: 12 | Fill #2

## 2020-07-04 MED FILL — TRULICITY 1.5 MG/0.5 ML PEN: 1.5 | 84 days supply | Qty: 6 | Fill #2

## 2020-07-07 MED FILL — BASAGLAR 100 UNIT/ML KWIKPE: 100 | 30 days supply | Qty: 45 | Fill #5

## 2020-07-20 ENCOUNTER — Other Ambulatory Visit (HOSPITAL_COMMUNITY): Payer: Self-pay | Admitting: Endocrinology

## 2020-07-20 MED FILL — UNIFINE PENTIPS 31GX3/16: 31G X 5 MM | 90 days supply | Qty: 200 | Fill #0

## 2020-07-26 ENCOUNTER — Other Ambulatory Visit: Payer: Self-pay | Admitting: Internal Medicine

## 2020-07-26 DIAGNOSIS — Z1211 Encounter for screening for malignant neoplasm of colon: Secondary | ICD-10-CM

## 2020-08-01 MED FILL — NORTRIPTYLINE HCL 50 MG CAP: 50 | 90 days supply | Qty: 180 | Fill #1

## 2020-08-01 MED FILL — DOXAZOSIN MESYLATE 2 MG TAB: 2 | 90 days supply | Qty: 90 | Fill #1

## 2020-08-10 ENCOUNTER — Other Ambulatory Visit: Payer: Self-pay | Admitting: Internal Medicine

## 2020-08-10 MED FILL — AMLODIPINE BESYLATE 10 MG T: 10 | 30 days supply | Qty: 30 | Fill #0

## 2020-08-11 MED FILL — BASAGLAR 100 UNIT/ML KWIKPE: 100 | 90 days supply | Qty: 135 | Fill #0

## 2020-08-21 ENCOUNTER — Other Ambulatory Visit (HOSPITAL_COMMUNITY): Payer: Self-pay | Admitting: Optometry

## 2020-08-21 MED FILL — NEO/POLYMYXIN/DEXAMETH DROP: 3.5-10000-0 | 7 days supply | Qty: 5 | Fill #0

## 2020-08-28 ENCOUNTER — Telehealth: Payer: Self-pay | Admitting: Internal Medicine

## 2020-08-28 NOTE — Telephone Encounter (Signed)
FAXED TO # BELOW.Marland Kitchen/LMB

## 2020-08-28 NOTE — Telephone Encounter (Signed)
Patient currently at chiropractor  Meylor's office requesting copy of med list be faxed to [phone]

## 2020-09-02 ENCOUNTER — Other Ambulatory Visit: Payer: Self-pay | Admitting: Internal Medicine

## 2020-09-04 ENCOUNTER — Other Ambulatory Visit (HOSPITAL_COMMUNITY): Payer: Self-pay

## 2020-09-04 MED ORDER — CITALOPRAM HYDROBROMIDE 10 MG PO TABS
20.0000 mg | ORAL_TABLET | Freq: Every day | ORAL | 0 refills | Status: DC
  Filled 2020-09-04: qty 60, 30d supply, fill #0

## 2020-09-04 MED ORDER — FUROSEMIDE 20 MG PO TABS
ORAL_TABLET | ORAL | 0 refills | Status: DC
  Filled 2020-09-04: qty 60, 30d supply, fill #0

## 2020-09-11 MED FILL — Metformin HCl Tab 1000 MG: ORAL | Qty: 180 | Fill #0 | Status: CN

## 2020-09-12 ENCOUNTER — Other Ambulatory Visit (HOSPITAL_COMMUNITY): Payer: Self-pay

## 2020-09-14 ENCOUNTER — Other Ambulatory Visit (HOSPITAL_COMMUNITY): Payer: Self-pay

## 2020-09-14 MED FILL — Metformin HCl Tab 1000 MG: ORAL | 90 days supply | Qty: 180 | Fill #0 | Status: AC

## 2020-09-18 ENCOUNTER — Other Ambulatory Visit: Payer: Self-pay | Admitting: Internal Medicine

## 2020-09-18 ENCOUNTER — Other Ambulatory Visit (HOSPITAL_COMMUNITY): Payer: Self-pay

## 2020-09-18 MED ORDER — BUPROPION HCL ER (XL) 150 MG PO TB24
ORAL_TABLET | Freq: Every day | ORAL | 3 refills | Status: DC
  Filled 2020-09-18: qty 90, 90d supply, fill #0
  Filled 2020-12-15: qty 90, 90d supply, fill #1
  Filled 2021-03-24: qty 90, 90d supply, fill #2

## 2020-09-18 MED FILL — Sildenafil Citrate Tab 100 MG: ORAL | 30 days supply | Qty: 6 | Fill #0 | Status: AC

## 2020-09-19 ENCOUNTER — Other Ambulatory Visit (HOSPITAL_COMMUNITY): Payer: Self-pay

## 2020-10-06 ENCOUNTER — Other Ambulatory Visit (HOSPITAL_COMMUNITY): Payer: Self-pay

## 2020-10-06 ENCOUNTER — Other Ambulatory Visit: Payer: Self-pay | Admitting: Internal Medicine

## 2020-10-06 MED ORDER — CHLORHEXIDINE GLUCONATE 0.12 % MT SOLN
OROMUCOSAL | 3 refills | Status: DC
  Filled 2020-10-06: qty 473, 15d supply, fill #0

## 2020-10-06 MED ORDER — DOXYCYCLINE HYCLATE 20 MG PO TABS
ORAL_TABLET | ORAL | 5 refills | Status: DC
  Filled 2020-10-06: qty 60, 30d supply, fill #0

## 2020-10-06 MED ORDER — AMLODIPINE BESYLATE 10 MG PO TABS
ORAL_TABLET | ORAL | 0 refills | Status: DC
  Filled 2020-10-06: qty 10, 10d supply, fill #0

## 2020-10-09 ENCOUNTER — Other Ambulatory Visit (HOSPITAL_COMMUNITY): Payer: Self-pay

## 2020-10-09 MED ORDER — FUROSEMIDE 20 MG PO TABS
ORAL_TABLET | ORAL | 0 refills | Status: DC
  Filled 2020-10-09: qty 60, 30d supply, fill #0

## 2020-10-09 MED ORDER — CITALOPRAM HYDROBROMIDE 10 MG PO TABS
20.0000 mg | ORAL_TABLET | Freq: Every day | ORAL | 0 refills | Status: DC
  Filled 2020-10-09: qty 60, 30d supply, fill #0

## 2020-10-13 ENCOUNTER — Other Ambulatory Visit (HOSPITAL_COMMUNITY): Payer: Self-pay

## 2020-10-13 MED FILL — Glimepiride Tab 4 MG: ORAL | 90 days supply | Qty: 180 | Fill #0 | Status: AC

## 2020-10-16 ENCOUNTER — Other Ambulatory Visit (HOSPITAL_COMMUNITY): Payer: Self-pay

## 2020-10-16 ENCOUNTER — Ambulatory Visit (INDEPENDENT_AMBULATORY_CARE_PROVIDER_SITE_OTHER): Payer: No Typology Code available for payment source | Admitting: Internal Medicine

## 2020-10-16 ENCOUNTER — Telehealth: Payer: Self-pay | Admitting: Internal Medicine

## 2020-10-16 ENCOUNTER — Encounter: Payer: Self-pay | Admitting: Internal Medicine

## 2020-10-16 ENCOUNTER — Other Ambulatory Visit: Payer: Self-pay

## 2020-10-16 VITALS — BP 182/98 | HR 96 | Temp 98.5°F | Ht 72.0 in | Wt 309.6 lb

## 2020-10-16 DIAGNOSIS — E1165 Type 2 diabetes mellitus with hyperglycemia: Secondary | ICD-10-CM

## 2020-10-16 DIAGNOSIS — F339 Major depressive disorder, recurrent, unspecified: Secondary | ICD-10-CM

## 2020-10-16 DIAGNOSIS — Z0000 Encounter for general adult medical examination without abnormal findings: Secondary | ICD-10-CM

## 2020-10-16 DIAGNOSIS — E785 Hyperlipidemia, unspecified: Secondary | ICD-10-CM | POA: Diagnosis not present

## 2020-10-16 DIAGNOSIS — Z125 Encounter for screening for malignant neoplasm of prostate: Secondary | ICD-10-CM

## 2020-10-16 DIAGNOSIS — E1140 Type 2 diabetes mellitus with diabetic neuropathy, unspecified: Secondary | ICD-10-CM | POA: Diagnosis not present

## 2020-10-16 DIAGNOSIS — Z0001 Encounter for general adult medical examination with abnormal findings: Secondary | ICD-10-CM | POA: Diagnosis not present

## 2020-10-16 DIAGNOSIS — IMO0002 Reserved for concepts with insufficient information to code with codable children: Secondary | ICD-10-CM

## 2020-10-16 DIAGNOSIS — I10 Essential (primary) hypertension: Secondary | ICD-10-CM

## 2020-10-16 DIAGNOSIS — E291 Testicular hypofunction: Secondary | ICD-10-CM

## 2020-10-16 LAB — LIPID PANEL
Cholesterol: 131 mg/dL (ref 0–200)
HDL: 44.7 mg/dL (ref >39.00)
LDL Cholesterol: 59 mg/dL (ref 0–99)
NonHDL: 85.86
Total CHOL/HDL Ratio: 3
Triglycerides: 133 mg/dL (ref 0.0–149.0)
VLDL: 26.6 mg/dL (ref 0.0–40.0)

## 2020-10-16 LAB — URINALYSIS, ROUTINE W REFLEX MICROSCOPIC
Bilirubin Urine: NEGATIVE
Hgb urine dipstick: NEGATIVE
Ketones, ur: NEGATIVE
Leukocytes,Ua: NEGATIVE
Nitrite: NEGATIVE
RBC / HPF: NONE SEEN (ref 0–?)
Specific Gravity, Urine: 1.02 (ref 1.000–1.030)
Total Protein, Urine: 100 — AB
Urine Glucose: 250 — AB
Urobilinogen, UA: 0.2 (ref 0.0–1.0)
pH: 6.5 (ref 5.0–8.0)

## 2020-10-16 LAB — MICROALBUMIN / CREATININE URINE RATIO
Creatinine,U: 62.6 mg/dL
Microalb Creat Ratio: 154.5 mg/g — ABNORMAL HIGH (ref 0.0–30.0)
Microalb, Ur: 96.7 mg/dL — ABNORMAL HIGH (ref 0.0–1.9)

## 2020-10-16 LAB — CBC WITH DIFFERENTIAL/PLATELET
Basophils Absolute: 0.1 10*3/uL (ref 0.0–0.1)
Basophils Relative: 1.4 % (ref 0.0–3.0)
Eosinophils Absolute: 0.4 10*3/uL (ref 0.0–0.7)
Eosinophils Relative: 3.6 % (ref 0.0–5.0)
HCT: 39.2 % (ref 39.0–52.0)
Hemoglobin: 13.1 g/dL (ref 13.0–17.0)
Lymphocytes Relative: 25.4 % (ref 12.0–46.0)
Lymphs Abs: 2.6 10*3/uL (ref 0.7–4.0)
MCHC: 33.3 g/dL (ref 30.0–36.0)
MCV: 76.7 fl — ABNORMAL LOW (ref 78.0–100.0)
Monocytes Absolute: 1 10*3/uL (ref 0.1–1.0)
Monocytes Relative: 9.5 % (ref 3.0–12.0)
Neutro Abs: 6.1 10*3/uL (ref 1.4–7.7)
Neutrophils Relative %: 60.1 % (ref 43.0–77.0)
Platelets: 282 10*3/uL (ref 150.0–400.0)
RBC: 5.11 Mil/uL (ref 4.22–5.81)
RDW: 14.6 % (ref 11.5–15.5)
WBC: 10.2 10*3/uL (ref 4.0–10.5)

## 2020-10-16 LAB — COMPREHENSIVE METABOLIC PANEL
ALT: 42 U/L (ref 0–53)
AST: 25 U/L (ref 0–37)
Albumin: 4.1 g/dL (ref 3.5–5.2)
Alkaline Phosphatase: 69 U/L (ref 39–117)
BUN: 11 mg/dL (ref 6–23)
CO2: 32 mEq/L (ref 19–32)
Calcium: 9.1 mg/dL (ref 8.4–10.5)
Chloride: 101 mEq/L (ref 96–112)
Creatinine, Ser: 0.74 mg/dL (ref 0.40–1.50)
GFR: 103.59 mL/min (ref >60.00)
Glucose, Bld: 170 mg/dL — ABNORMAL HIGH (ref 70–99)
Potassium: 3.6 mEq/L (ref 3.5–5.1)
Sodium: 140 mEq/L (ref 135–145)
Total Bilirubin: 0.5 mg/dL (ref 0.2–1.2)
Total Protein: 7 g/dL (ref 6.0–8.3)

## 2020-10-16 LAB — PSA: PSA: 0.7 ng/mL (ref 0.10–4.00)

## 2020-10-16 LAB — TSH: TSH: 2.36 u[IU]/mL (ref 0.35–4.50)

## 2020-10-16 LAB — HEMOGLOBIN A1C: Hgb A1c MFr Bld: 9.1 % — ABNORMAL HIGH (ref 4.6–6.5)

## 2020-10-16 MED ORDER — DOXAZOSIN MESYLATE 8 MG PO TABS
4.0000 mg | ORAL_TABLET | Freq: Every day | ORAL | 3 refills | Status: DC
  Filled 2020-10-16: qty 90, 90d supply, fill #0
  Filled 2021-01-08: qty 90, 90d supply, fill #1
  Filled 2021-04-25: qty 90, 90d supply, fill #2
  Filled 2021-07-25: qty 30, 30d supply, fill #3
  Filled 2021-09-03: qty 30, 30d supply, fill #4
  Filled 2021-09-26: qty 30, 30d supply, fill #5

## 2020-10-16 MED ORDER — CITALOPRAM HYDROBROMIDE 20 MG PO TABS
20.0000 mg | ORAL_TABLET | Freq: Every day | ORAL | 3 refills | Status: DC
  Filled 2020-10-16: qty 90, 90d supply, fill #0
  Filled 2021-01-26: qty 90, 90d supply, fill #1
  Filled 2021-04-25: qty 90, 90d supply, fill #2
  Filled 2021-07-25: qty 30, 30d supply, fill #3
  Filled 2021-09-03: qty 30, 30d supply, fill #4
  Filled 2021-09-26: qty 30, 30d supply, fill #5

## 2020-10-16 NOTE — Telephone Encounter (Signed)
Patient called and said that he would like to have the cardiac scoring test done. He can be reached at [phone]. Please advise

## 2020-10-16 NOTE — Assessment & Plan Note (Addendum)
Worse.  Increase Celexa to 20 mg/d.  Advised good diet, weight loss, regular exercise

## 2020-10-16 NOTE — Progress Notes (Signed)
Subjective:  Patient ID: Victor Byrd, male    DOB: Jul 10, 1965  Age: 55 y.o. MRN: [id number]  CC: Annual Exam   HPI Victor Byrd presents for a well exam C/o depression, lack of motivation, elevated BP    ROS: Review of Systems  Constitutional: Positive for unexpected weight change. Negative for appetite change and fatigue.  HENT: Negative for congestion, nosebleeds, sneezing, sore throat and trouble swallowing.   Eyes: Negative for itching and visual disturbance.  Respiratory: Negative for cough.   Cardiovascular: Negative for chest pain, palpitations and leg swelling.  Gastrointestinal: Negative for abdominal distention, blood in stool, diarrhea and nausea.  Genitourinary: Negative for frequency and hematuria.  Musculoskeletal: Negative for back pain, gait problem, joint swelling and neck pain.  Skin: Negative for rash.  Neurological: Negative for dizziness, tremors, speech difficulty and weakness.  Psychiatric/Behavioral: Negative for agitation, dysphoric mood and sleep disturbance. The patient is not nervous/anxious.     Objective:  BP (!) 182/98 (BP Location: Left Arm)   Pulse 96   Temp 98.5 F (36.9 C) (Oral)   Ht 6' (1.829 m)   Wt (!) 309 lb 9.6 oz (140.4 kg)   SpO2 96%   BMI 41.99 kg/m   BP Readings from Last 3 Encounters:  10/16/20 (!) 182/98  05/03/20 (!) 144/82  08/10/19 (!) 170/84    Wt Readings from Last 3 Encounters:  10/16/20 (!) 309 lb 9.6 oz (140.4 kg)  05/03/20 (!) 305 lb (138.3 kg)  08/10/19 (!) 308 lb (139.7 kg)    Physical Exam Constitutional:      General: He is not in acute distress.    Appearance: He is well-developed. He is obese.     Comments: NAD  Eyes:     Conjunctiva/sclera: Conjunctivae normal.     Pupils: Pupils are equal, round, and reactive to light.  Neck:     Thyroid: No thyromegaly.     Vascular: No JVD.  Cardiovascular:     Rate and Rhythm: Normal rate and regular rhythm.     Heart sounds: Normal heart sounds.  No murmur heard. No friction rub. No gallop.   Pulmonary:     Effort: Pulmonary effort is normal. No respiratory distress.     Breath sounds: Normal breath sounds. No wheezing or rales.  Chest:     Chest wall: No tenderness.  Abdominal:     General: Bowel sounds are normal. There is no distension.     Palpations: Abdomen is soft. There is no mass.     Tenderness: There is no abdominal tenderness. There is no guarding or rebound.  Musculoskeletal:        General: No tenderness. Normal range of motion.     Cervical back: Normal range of motion.  Lymphadenopathy:     Cervical: No cervical adenopathy.  Skin:    General: Skin is warm and dry.     Findings: No rash.  Neurological:     Mental Status: He is alert and oriented to person, place, and time.     Cranial Nerves: No cranial nerve deficit.     Motor: No abnormal muscle tone.     Coordination: Coordination normal.     Gait: Gait normal.     Deep Tendon Reflexes: Reflexes are normal and symmetric.  Psychiatric:        Behavior: Behavior normal.        Thought Content: Thought content normal.        Judgment: Judgment normal.  large abdomen Rectal - per GI  I spent 22 minutes in addition to time for CPX wellness examination in preparing to see the patient by review of recent labs, imaging and procedures, obtaining and reviewing separately obtained history, communicating with the patient, ordering medications, tests or procedures, and documenting clinical information in the EHR including the differential diagnosis, treatment, and any further evaluation and other management of  uncontrolled hypertension, worsening depression.         Lab Results  Component Value Date   WBC 8.6 08/12/2019   HGB 14.1 08/12/2019   HCT 41.6 08/12/2019   PLT 261.0 08/12/2019   GLUCOSE 142 (H) 05/12/2018   CHOL 141 08/12/2019   TRIG 125.0 08/12/2019   HDL 41.80 08/12/2019   LDLCALC 75 08/12/2019   ALT 42 08/12/2019   AST 28 08/12/2019    NA 141 05/12/2018   K 3.4 (L) 05/12/2018   CL 103 05/12/2018   CREATININE 0.70 05/12/2018   BUN 13 05/12/2018   CO2 30 05/12/2018   TSH 3.20 12/02/2017   PSA 0.74 08/12/2019   HGBA1C 8.8 (H) 03/13/2016    No results found.  Assessment & Plan:    Walker Kehr, MD

## 2020-10-16 NOTE — Assessment & Plan Note (Addendum)
A1c - per Dr Chalmers Cater Urine microalb Coronary calcium CT was ordered

## 2020-10-16 NOTE — Assessment & Plan Note (Addendum)
  We discussed age appropriate health related issues, including available/recomended screening tests and vaccinations. Labs were ordered to be later reviewed . All questions were answered. We discussed one or more of the following - seat belt use, use of sunscreen/sun exposure exercise, safe sex, fall risk reduction, second hand smoke exposure, firearm use and storage, seat belt use, a need for adhering to healthy diet and exercise. Labs were ordered.  All questions were answered. Cardiac CT scan for calcium scoring offered  Colonoscopy is pending

## 2020-10-16 NOTE — Patient Instructions (Signed)

## 2020-10-16 NOTE — Assessment & Plan Note (Addendum)
BP Readings from Last 3 Encounters:  10/16/20 (!) 182/98  05/03/20 (!) 144/82  08/10/19 (!) 170/84  Worse. Increase Cardura to 4 mg, then to 8 mg at hs RTC soon if BP did not normalize

## 2020-10-17 ENCOUNTER — Encounter: Payer: Self-pay | Admitting: Internal Medicine

## 2020-10-17 ENCOUNTER — Other Ambulatory Visit (HOSPITAL_COMMUNITY): Payer: Self-pay

## 2020-10-17 MED ORDER — FUROSEMIDE 20 MG PO TABS: 20.0000 mg | ORAL_TABLET | Freq: Every day | ORAL | 11 refills | Status: DC

## 2020-10-17 NOTE — Assessment & Plan Note (Addendum)
Likely, this problem is contributing to Victor Byrd's depression Weight loss, good diet, exercise.  She can discuss this problem with Dr. Chalmers Cater

## 2020-10-17 NOTE — Telephone Encounter (Signed)
Called pt there was no answer LMOM w/MD response../lmb 

## 2020-10-17 NOTE — Telephone Encounter (Signed)
The order is in. Thanks

## 2020-10-18 ENCOUNTER — Other Ambulatory Visit: Payer: Self-pay

## 2020-10-18 ENCOUNTER — Other Ambulatory Visit (HOSPITAL_COMMUNITY): Payer: Self-pay

## 2020-10-18 ENCOUNTER — Ambulatory Visit (AMBULATORY_SURGERY_CENTER): Payer: Self-pay

## 2020-10-18 VITALS — Ht 72.0 in | Wt 316.0 lb

## 2020-10-18 DIAGNOSIS — Z1211 Encounter for screening for malignant neoplasm of colon: Secondary | ICD-10-CM

## 2020-10-18 MED ORDER — NA SULFATE-K SULFATE-MG SULF 17.5-3.13-1.6 GM/177ML PO SOLN
1.0000 | Freq: Once | ORAL | 0 refills | Status: DC
  Filled 2020-10-18 – 2020-10-31 (×2): qty 354, 1d supply, fill #0

## 2020-10-18 MED ORDER — CARESTART COVID-19 HOME TEST VI KIT
PACK | 0 refills | Status: DC
  Filled 2020-10-18: qty 4, 4d supply, fill #0

## 2020-10-18 NOTE — Progress Notes (Signed)
No allergies to soy or egg Pt is not on blood thinners or diet pills Denies issues with sedation/intubation Denies atrial flutter/fib Denies constipation   Emmi instructions given to pt  Pt is aware of Covid safety and care partner requirements.  Denies covid symptoms but says he has a sore throat.  Plans to test tonight and will let us know if it is positive.   Pt states he is no longer taking Lantus or any other long acting insuline.   He has started Trulicity once weekly.

## 2020-10-18 NOTE — Patient Instructions (Signed)
uprep

## 2020-10-19 ENCOUNTER — Other Ambulatory Visit (HOSPITAL_COMMUNITY): Payer: Self-pay

## 2020-10-20 ENCOUNTER — Other Ambulatory Visit (HOSPITAL_COMMUNITY): Payer: Self-pay

## 2020-10-20 MED ORDER — AMLODIPINE BESYLATE 10 MG PO TABS
10.0000 mg | ORAL_TABLET | Freq: Every day | ORAL | 6 refills | Status: DC
  Filled 2020-10-20: qty 30, 30d supply, fill #0
  Filled 2020-11-23: qty 30, 30d supply, fill #1
  Filled 2020-12-22: qty 30, 30d supply, fill #2
  Filled 2021-01-26: qty 30, 30d supply, fill #3
  Filled 2021-02-28: qty 30, 30d supply, fill #4
  Filled 2021-04-01: qty 30, 30d supply, fill #5
  Filled 2021-04-25: qty 30, 30d supply, fill #6

## 2020-10-20 MED ORDER — TRULICITY 3 MG/0.5ML ~~LOC~~ SOAJ
SUBCUTANEOUS | 6 refills | Status: DC
  Filled 2020-10-20: qty 6, 84d supply, fill #0

## 2020-10-24 ENCOUNTER — Other Ambulatory Visit (HOSPITAL_COMMUNITY): Payer: Self-pay

## 2020-10-25 ENCOUNTER — Other Ambulatory Visit (HOSPITAL_COMMUNITY): Payer: Self-pay

## 2020-10-25 MED ORDER — ACCU-CHEK GUIDE VI STRP
ORAL_STRIP | 10 refills | Status: DC
  Filled 2020-10-27: qty 100, 50d supply, fill #0

## 2020-10-27 ENCOUNTER — Other Ambulatory Visit: Payer: Self-pay

## 2020-10-27 ENCOUNTER — Other Ambulatory Visit (HOSPITAL_COMMUNITY): Payer: Self-pay

## 2020-10-27 ENCOUNTER — Ambulatory Visit (INDEPENDENT_AMBULATORY_CARE_PROVIDER_SITE_OTHER)
Admission: RE | Admit: 2020-10-27 | Discharge: 2020-10-27 | Disposition: A | Discharge status: Home / Self Care | Payer: Self-pay | Source: Ambulatory Visit | Attending: Internal Medicine | Admitting: Internal Medicine

## 2020-10-27 DIAGNOSIS — IMO0002 Reserved for concepts with insufficient information to code with codable children: Secondary | ICD-10-CM

## 2020-10-27 DIAGNOSIS — E1165 Type 2 diabetes mellitus with hyperglycemia: Secondary | ICD-10-CM

## 2020-10-27 DIAGNOSIS — E1140 Type 2 diabetes mellitus with diabetic neuropathy, unspecified: Secondary | ICD-10-CM

## 2020-10-27 MED ORDER — FREESTYLE FREEDOM LITE W/DEVICE KIT
PACK | 0 refills | Status: DC
  Filled 2020-10-27: qty 1, 30d supply, fill #0

## 2020-10-27 MED ORDER — GLUCOSE BLOOD VI STRP
ORAL_STRIP | 0 refills | Status: DC
  Filled 2020-10-27: qty 100, 30d supply, fill #0

## 2020-10-27 MED ORDER — FREESTYLE LANCETS MISC
0 refills | Status: DC
  Filled 2020-10-27: qty 100, 30d supply, fill #0

## 2020-10-28 ENCOUNTER — Encounter: Payer: Self-pay | Admitting: Internal Medicine

## 2020-10-28 DIAGNOSIS — I700 Atherosclerosis of aorta: Secondary | ICD-10-CM | POA: Insufficient documentation

## 2020-10-28 DIAGNOSIS — I2510 Atherosclerotic heart disease of native coronary artery without angina pectoris: Secondary | ICD-10-CM | POA: Insufficient documentation

## 2020-10-31 ENCOUNTER — Other Ambulatory Visit (HOSPITAL_COMMUNITY): Payer: Self-pay

## 2020-11-01 ENCOUNTER — Ambulatory Visit (AMBULATORY_SURGERY_CENTER): Payer: No Typology Code available for payment source | Admitting: Gastroenterology

## 2020-11-01 ENCOUNTER — Other Ambulatory Visit: Payer: Self-pay

## 2020-11-01 ENCOUNTER — Encounter: Payer: Self-pay | Admitting: Gastroenterology

## 2020-11-01 VITALS — BP 146/84 | HR 78 | Temp 97.3°F | Resp 16 | Ht 72.0 in | Wt 316.0 lb

## 2020-11-01 DIAGNOSIS — D122 Benign neoplasm of ascending colon: Secondary | ICD-10-CM

## 2020-11-01 DIAGNOSIS — Z1211 Encounter for screening for malignant neoplasm of colon: Secondary | ICD-10-CM

## 2020-11-01 DIAGNOSIS — D123 Benign neoplasm of transverse colon: Secondary | ICD-10-CM | POA: Diagnosis not present

## 2020-11-01 HISTORY — PX: COLONOSCOPY: SHX174

## 2020-11-01 MED ORDER — SODIUM CHLORIDE 0.9 % IV SOLN: 500.0000 mL | Freq: Once | INTRAVENOUS | Status: DC

## 2020-11-01 NOTE — Progress Notes (Signed)
Called to room to assist during endoscopic procedure.  Patient ID and intended procedure confirmed with present staff. Received instructions for my participation in the procedure from the performing physician.  

## 2020-11-01 NOTE — Patient Instructions (Signed)
YOU HAD AN ENDOSCOPIC PROCEDURE TODAY AT THE [facility]:   Refer to the procedure report that was given to you for any specific questions about what was found during the examination.  If the procedure report does not answer your questions, please call your gastroenterologist to clarify.  If you requested that your care partner not be given the details of your procedure findings, then the procedure report has been included in a sealed envelope for you to review at your convenience later.  YOU SHOULD EXPECT: Some feelings of bloating in the abdomen. Passage of more gas than usual.  Walking can help get rid of the air that was put into your GI tract during the procedure and reduce the bloating. If you had a lower endoscopy (such as a colonoscopy or flexible sigmoidoscopy) you may notice spotting of blood in your stool or on the toilet paper. If you underwent a bowel prep for your procedure, you may not have a normal bowel movement for a few days.  Please Note:  You might notice some irritation and congestion in your nose or some drainage.  This is from the oxygen used during your procedure.  There is no need for concern and it should clear up in a day or so.  SYMPTOMS TO REPORT IMMEDIATELY:   Following lower endoscopy (colonoscopy or flexible sigmoidoscopy):  Excessive amounts of blood in the stool  Significant tenderness or worsening of abdominal pains  Swelling of the abdomen that is new, acute  Fever of 100F or higher  For urgent or emergent issues, a gastroenterologist can be reached at any hour by calling [phone]. Do not use MyChart messaging for urgent concerns.    DIET:  We do recommend a small meal at first, but then you may proceed to your regular diet.  Drink plenty of fluids but you should avoid alcoholic beverages for 24 hours.  ACTIVITY:  You should plan to take it easy for the rest of today and you should NOT DRIVE or use heavy machinery until tomorrow (because  of the sedation medicines used during the test).    FOLLOW UP: Our staff will call the number listed on your records 48-72 hours following your procedure to check on you and address any questions or concerns that you may have regarding the information given to you following your procedure. If we do not reach you, we will leave a message.  We will attempt to reach you two times.  During this call, we will ask if you have developed any symptoms of COVID 19. If you develop any symptoms (ie: fever, flu-like symptoms, shortness of breath, cough etc.) before then, please call [phone].  If you test positive for Covid 19 in the 2 weeks post procedure, please call and report this information to us.    If any biopsies were taken you will be contacted by phone or by letter within the next 1-3 weeks.  Please call us at [phone] if you have not heard about the biopsies in 3 weeks.    SIGNATURES/CONFIDENTIALITY: You and/or your care partner have signed paperwork which will be entered into your electronic medical record.  These signatures attest to the fact that that the information above on your After Visit Summary has been reviewed and is understood.  Full responsibility of the confidentiality of this discharge information lies with you and/or your care-partner. 

## 2020-11-01 NOTE — Progress Notes (Signed)
pt tolerated well. VSS. awake and to recovery. Report given to RN.  

## 2020-11-01 NOTE — Op Note (Signed)
[facility] Patient Name: Victor Byrd Procedure Date: 11/01/2020 9:09 AM MRN: [id number] Endoscopist: Remo Lipps P. Havery Moros , MD Age: 55 Referring MD:  Date of Birth: 10-23-1965 Gender: Male Account #: [id number] Procedure:                Colonoscopy Indications:              Screening for colorectal malignant neoplasm, This                            is the patient's first colonoscopy Medicines:                Monitored Anesthesia Care Procedure:                Pre-Anesthesia Assessment:                           - Prior to the procedure, a History and Physical                            was performed, and patient medications and                            allergies were reviewed. The patient's tolerance of                            previous anesthesia was also reviewed. The risks                            and benefits of the procedure and the sedation                            options and risks were discussed with the patient.                            All questions were answered, and informed consent                            was obtained. Prior Anticoagulants: The patient has                            taken no previous anticoagulant or antiplatelet                            agents. ASA Grade Assessment: III - A patient with                            severe systemic disease. After reviewing the risks                            and benefits, the patient was deemed in                            satisfactory condition to undergo the procedure.  After obtaining informed consent, the colonoscope                            was passed under direct vision. Throughout the                            procedure, the patient's blood pressure, pulse, and                            oxygen saturations were monitored continuously. The                            Olympus [id number]) Colonoscope was                            introduced through the  anus and advanced to the the                            cecum, identified by appendiceal orifice and                            ileocecal valve. The colonoscopy was performed                            without difficulty. The patient tolerated the                            procedure well. The quality of the bowel                            preparation was adequate. The ileocecal valve,                            appendiceal orifice, and rectum were photographed. Scope In: 9:17:31 AM Scope Out: 9:36:10 AM Scope Withdrawal Time: 0 hours 15 minutes 30 seconds  Total Procedure Duration: 0 hours 18 minutes 39 seconds  Findings:                 The perianal and digital rectal examinations were                            normal.                           A 5 mm polyp was found in the ascending colon. The                            polyp was sessile. The polyp was removed with a                            cold snare. Resection and retrieval were complete.                           A 4 mm polyp was found in the transverse colon. The  polyp was sessile. The polyp was removed with a                            cold snare. Resection and retrieval were complete.                           Internal hemorrhoids were found during retroflexion.                           The exam was otherwise without abnormality. Prep                            was adequate but several minutes spent lavaging the                            colon to achieve adequate views. Complications:            No immediate complications. Estimated blood loss:                            Minimal. Estimated Blood Loss:     Estimated blood loss was minimal. Impression:               - One 5 mm polyp in the ascending colon, removed                            with a cold snare. Resected and retrieved.                           - One 4 mm polyp in the transverse colon, removed                            with a cold  snare. Resected and retrieved.                           - Internal hemorrhoids.                           - The examination was otherwise normal. Recommendation:           - Patient has a contact number available for                            emergencies. The signs and symptoms of potential                            delayed complications were discussed with the                            patient. Return to normal activities tomorrow.                            Written discharge instructions were provided to the  patient.                           - Resume previous diet.                           - Continue present medications.                           - Await pathology results. Remo Lipps P. Kemisha Bonnette, MD 11/01/2020 9:43:59 AM This report has been signed electronically.

## 2020-11-01 NOTE — Progress Notes (Signed)
Pt's states no medical or surgical changes since previsit or office visit.  [facility] [place] 

## 2020-11-03 ENCOUNTER — Telehealth: Payer: Self-pay

## 2020-11-03 NOTE — Telephone Encounter (Signed)
LVM

## 2020-11-13 ENCOUNTER — Other Ambulatory Visit: Payer: Self-pay | Admitting: Internal Medicine

## 2020-11-13 ENCOUNTER — Other Ambulatory Visit (HOSPITAL_COMMUNITY): Payer: Self-pay

## 2020-11-13 MED ORDER — FUROSEMIDE 20 MG PO TABS
ORAL_TABLET | ORAL | 0 refills | Status: DC
  Filled 2020-11-13: qty 180, 90d supply, fill #0

## 2020-11-13 MED FILL — Sildenafil Citrate Tab 100 MG: ORAL | 30 days supply | Qty: 6 | Fill #1 | Status: AC

## 2020-11-15 ENCOUNTER — Other Ambulatory Visit (HOSPITAL_COMMUNITY): Payer: Self-pay

## 2020-11-15 MED FILL — Nortriptyline HCl Cap 50 MG: ORAL | 90 days supply | Qty: 180 | Fill #0 | Status: AC

## 2020-11-16 ENCOUNTER — Other Ambulatory Visit: Payer: Self-pay

## 2020-11-16 ENCOUNTER — Emergency Department (HOSPITAL_COMMUNITY)
Admission: EM | Admit: 2020-11-16 | Discharge: 2020-11-16 | Disposition: A | Discharge status: Home / Self Care | Payer: No Typology Code available for payment source | Attending: Emergency Medicine | Admitting: Emergency Medicine

## 2020-11-16 ENCOUNTER — Encounter (HOSPITAL_COMMUNITY): Payer: Self-pay

## 2020-11-16 ENCOUNTER — Emergency Department (HOSPITAL_COMMUNITY): Payer: No Typology Code available for payment source

## 2020-11-16 DIAGNOSIS — Z79899 Other long term (current) drug therapy: Secondary | ICD-10-CM | POA: Insufficient documentation

## 2020-11-16 DIAGNOSIS — R519 Headache, unspecified: Secondary | ICD-10-CM | POA: Diagnosis present

## 2020-11-16 DIAGNOSIS — F419 Anxiety disorder, unspecified: Secondary | ICD-10-CM | POA: Insufficient documentation

## 2020-11-16 DIAGNOSIS — Z7984 Long term (current) use of oral hypoglycemic drugs: Secondary | ICD-10-CM | POA: Diagnosis not present

## 2020-11-16 DIAGNOSIS — E1140 Type 2 diabetes mellitus with diabetic neuropathy, unspecified: Secondary | ICD-10-CM | POA: Diagnosis not present

## 2020-11-16 DIAGNOSIS — Z794 Long term (current) use of insulin: Secondary | ICD-10-CM | POA: Insufficient documentation

## 2020-11-16 DIAGNOSIS — Z7982 Long term (current) use of aspirin: Secondary | ICD-10-CM | POA: Insufficient documentation

## 2020-11-16 DIAGNOSIS — I10 Essential (primary) hypertension: Secondary | ICD-10-CM | POA: Insufficient documentation

## 2020-11-16 DIAGNOSIS — J45909 Unspecified asthma, uncomplicated: Secondary | ICD-10-CM | POA: Insufficient documentation

## 2020-11-16 DIAGNOSIS — I160 Hypertensive urgency: Secondary | ICD-10-CM | POA: Insufficient documentation

## 2020-11-16 DIAGNOSIS — E785 Hyperlipidemia, unspecified: Secondary | ICD-10-CM | POA: Insufficient documentation

## 2020-11-16 DIAGNOSIS — E876 Hypokalemia: Secondary | ICD-10-CM | POA: Insufficient documentation

## 2020-11-16 DIAGNOSIS — M549 Dorsalgia, unspecified: Secondary | ICD-10-CM | POA: Insufficient documentation

## 2020-11-16 DIAGNOSIS — M1990 Unspecified osteoarthritis, unspecified site: Secondary | ICD-10-CM | POA: Insufficient documentation

## 2020-11-16 LAB — URINALYSIS, MICROSCOPIC (REFLEX)

## 2020-11-16 LAB — CBC WITH DIFFERENTIAL/PLATELET
Abs Immature Granulocytes: 0.05 10*3/uL (ref 0.00–0.07)
Basophils Absolute: 0.1 10*3/uL (ref 0.0–0.1)
Basophils Relative: 1 %
Eosinophils Absolute: 0.2 10*3/uL (ref 0.0–0.5)
Eosinophils Relative: 2 %
HCT: 39.5 % (ref 39.0–52.0)
Hemoglobin: 13 g/dL (ref 13.0–17.0)
Immature Granulocytes: 1 %
Lymphocytes Relative: 26 %
Lymphs Abs: 2.4 10*3/uL (ref 0.7–4.0)
MCH: 26.5 pg (ref 26.0–34.0)
MCHC: 32.9 g/dL (ref 30.0–36.0)
MCV: 80.4 fL (ref 80.0–100.0)
Monocytes Absolute: 0.9 10*3/uL (ref 0.1–1.0)
Monocytes Relative: 10 %
Neutro Abs: 5.8 10*3/uL (ref 1.7–7.7)
Neutrophils Relative %: 60 %
Platelets: 305 10*3/uL (ref 150–400)
RBC: 4.91 MIL/uL (ref 4.22–5.81)
RDW: 14 % (ref 11.5–15.5)
WBC: 9.5 10*3/uL (ref 4.0–10.5)
nRBC: 0 % (ref 0.0–0.2)

## 2020-11-16 LAB — URINALYSIS, ROUTINE W REFLEX MICROSCOPIC
Bilirubin Urine: NEGATIVE
Glucose, UA: NEGATIVE mg/dL
Hgb urine dipstick: NEGATIVE
Ketones, ur: NEGATIVE mg/dL
Leukocytes,Ua: NEGATIVE
Nitrite: NEGATIVE
Protein, ur: 100 mg/dL — AB
Specific Gravity, Urine: 1.01 (ref 1.005–1.030)
pH: 6.5 (ref 5.0–8.0)

## 2020-11-16 LAB — BASIC METABOLIC PANEL
Anion gap: 8 (ref 5–15)
BUN: 13 mg/dL (ref 6–20)
CO2: 30 mmol/L (ref 22–32)
Calcium: 9.2 mg/dL (ref 8.9–10.3)
Chloride: 99 mmol/L (ref 98–111)
Creatinine, Ser: 0.82 mg/dL (ref 0.61–1.24)
GFR, Estimated: >60 mL/min (ref >60)
Glucose, Bld: 88 mg/dL (ref 70–99)
Potassium: 3.3 mmol/L — ABNORMAL LOW (ref 3.5–5.1)
Sodium: 137 mmol/L (ref 135–145)

## 2020-11-16 LAB — CBG MONITORING, ED: Glucose-Capillary: 114 mg/dL — ABNORMAL HIGH (ref 70–99)

## 2020-11-16 MED ORDER — METOCLOPRAMIDE HCL 5 MG/ML IJ SOLN
10.0000 mg | Freq: Once | INTRAMUSCULAR | Status: AC
  Administered 2020-11-16: 10 mg via INTRAVENOUS
  Filled 2020-11-16: qty 2

## 2020-11-16 MED ORDER — SODIUM CHLORIDE 0.9 % IV BOLUS
1000.0000 mL | Freq: Once | INTRAVENOUS | Status: AC
  Administered 2020-11-16: 1000 mL via INTRAVENOUS

## 2020-11-16 MED ORDER — DIPHENHYDRAMINE HCL 50 MG/ML IJ SOLN
50.0000 mg | Freq: Once | INTRAMUSCULAR | Status: AC
  Administered 2020-11-16: 50 mg via INTRAVENOUS
  Filled 2020-11-16: qty 1

## 2020-11-16 NOTE — ED Triage Notes (Signed)
headache x3 weeks and decided to start checking bp at home 3 days ago. Home bp 210/110. Prescribed amlodipine and lasix at home.

## 2020-11-16 NOTE — Discharge Instructions (Addendum)
Please follow up with your PCP for further evaluation of your headache and high blood pressure.  I would recommend taking your blood pressure medication daily and keeping a log of your blood pressures to take with you to your  next appointment.  Your potassium was slightly low today at 3.3. Please increase the amount of potassium in your diet (see attached resource on same) and have your PCP recheck your potassium level next week  Return to the ED for any new/worsening symptoms

## 2020-11-16 NOTE — ED Provider Notes (Signed)
[facility] DEPT Provider Note   CSN: [id number] Arrival date & time: 11/16/20  1400     History Chief Complaint  Patient presents with   Hypertension    Victor Byrd is a 55 y.o. male with PMHx HTN, HLD, Diabetes who presents to the ED today with complaint of elevated blood pressure readings for the past 3 days. Pt reports that he has been having intermittent headaches for the past 3 weeks and began checking his blood pressure 3 days ago and noted it was elevated. He saw his PCP recently who increased his amlodipine dose and started him on Doxazosin for blood pressure. He has a follow up appt on 06/27. Pt has been taking Tylenol without much relief. He denies any blurry vision, double vision, light sensitivity, nausea, vomiting, chest pain, SOB, urinary retention, or any other associated symptoms.   The history is provided by the patient and medical records.      Past Medical History:  Diagnosis Date   Anxiety    Asthma    Depression    Diabetes mellitus type II    Hyperlipidemia    Hypertension    Hyperthyroidism    Pt denies thyroid issues   OSA on CPAP    Dr. Chalmers Cater   Restless legs    Sleep apnea    cpap    Patient Active Problem List   Diagnosis Date Noted   Coronary atherosclerosis 10/28/2020   Atherosclerosis of aorta ([place]) 10/28/2020   Nocturia 05/03/2020   Dyslipidemia 06/25/2018   Tear duct infection, right 05/08/2017   Tinnitus 12/18/2016   Hypogonadism male 12/18/2016   Decreased libido 12/18/2016   Candidal balanitis 07/18/2016   IT band syndrome 10/11/2015   Edema 10/11/2015   Lip lesion 08/31/2015   Erectile dysfunction 06/28/2015   Tinnitus of both ears 08/04/2014   Rotator cuff strain 06/01/2014   Acute pharyngitis 09/28/2013   Insomnia 08/30/2013   Chest wall pain 10/02/2011   Costochondritis 10/02/2011   URI, acute 07/17/2011   Atypical chest pain 06/12/2011   Chest pain, atypical 05/15/2011   UTI (urinary  tract infection) 11/25/2010   Well adult exam 11/05/2010   Laceration of index finger 09/11/2010   NECK PAIN, ACUTE 07/16/2010   LYMPHADENITIS 06/12/2010   APHTHOUS ULCERS 06/12/2010   OBSTRUCTIVE SLEEP APNEA 02/21/2010   URINARY URGENCY 02/21/2010   ADHD 11/09/2009   HYPERTHYROIDISM 09/07/2009   DM type 2, uncontrolled, 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/07/2009   Morbid obesity with BMI of 40.0-44.9, adult ([place]) 09/07/2009   Depression, major 09/07/2009   Essential hypertension 09/07/2009   Asthma 09/07/2009   RENAL CALCULUS, HX OF 09/07/2009   WISDOM TEETH EXTRACTION, HX OF 09/07/2009    Past Surgical History:  Procedure Laterality Date   COLONOSCOPY  11/01/2020   [place] Cellar, MD   HERNIA REPAIR  1972 & 1987   VASECTOMY  2007   WISDOM TOOTH EXTRACTION  1992       Family History  Problem Relation Age of Onset   Diabetes Mother    Heart disease Father    Heart attack Father    Hypertension Father    Colon polyps Father    Diabetes Other    Hyperlipidemia Other    Lung cancer Other    Hypertension Other    Colon cancer Neg Hx    Esophageal cancer Neg Hx    Rectal cancer Neg Hx    Stomach cancer Neg Hx     Social History  Tobacco Use   Smoking status: Never   Smokeless tobacco: Never  Vaping Use   Vaping Use: Never used  Substance Use Topics   Alcohol use: Yes    Alcohol/week: 0.0 standard drinks   Drug use: No    Home Medications Prior to Admission medications   Medication Sig Start Date End Date Taking? Authorizing Provider  albuterol (VENTOLIN HFA) 108 (90 BASE) MCG/ACT inhaler INHALE 2 PUFFS INTO THE LUNGS EVERY 6 HOURS AS NEEDED FOR WHEEZING. 06/01/14   Plotnikov, Evie Lacks, MD  amLODipine (NORVASC) 10 MG tablet Take 1 tablet (10 mg total) by mouth daily. 10/20/20     aspirin 81 MG tablet Take 81 mg by mouth daily.    [provider]  atorvastatin (LIPITOR) 40 MG tablet TAKE 1 TABLET BY MOUTH ONCE DAILY 09/14/19 09/13/20  Plotnikov, Evie Lacks,  MD  Blood Glucose Monitoring Suppl (FREESTYLE FREEDOM LITE) w/Device KIT Use as directed 10/27/20     buPROPion (WELLBUTRIN XL) 150 MG 24 hr tablet TAKE 1 TABLET BY MOUTH ONCE A DAY (NEEDS OFFICE VISIT) 09/18/20 09/18/21  Plotnikov, Evie Lacks, MD  chlorhexidine (PERIDEX) 0.12 % solution Use 52ms to swish in the mouth for 30 seconds then spit out 2 times per day after brushing teeth, 10/06/20     citalopram (CELEXA) 20 MG tablet Take 1 tablet (20 mg total) by mouth daily. 10/16/20 10/16/21  Plotnikov, AEvie Lacks MD  diclofenac (VOLTAREN) 75 MG EC tablet TAKE 1 TABLET BY MOUTH TWICE DAILY Patient not taking: No sig reported 12/18/15   Plotnikov, AEvie Lacks MD  doxazosin (CARDURA) 8 MG tablet Take 1/2 to 1 tablet (4-8 mg total) by mouth daily. 10/16/20   Plotnikov, AEvie Lacks MD  doxycycline (PERIOSTAT) 20 MG tablet Take 1 tablet by mouth twice a day until gone for periodontal infection 10/06/20     Dulaglutide (TRULICITY) 3 MNL/9.7QBSOPN Inject 380msubcutaneous once a week 10/20/20     furosemide (LASIX) 20 MG tablet Take 1-2 tablets (20-40 mg total) by mouth daily. 10/17/20 10/17/21  Plotnikov, AlEvie LacksMD  furosemide (LASIX) 20 MG tablet Take 1 to 2 tablets by mouth once a day as needed 11/13/20   Plotnikov, AlEvie LacksMD  glimepiride (AMARYL) 4 MG tablet TAKE 1 TABLET BY MOUTH TWO TIMES DAILY 01/13/20 01/12/21  BaJacelyn PiMD  glucose blood (ACCU-CHEK GUIDE) test strip Use as directed Patient taking differently: Use as directed 10/24/20     glucose blood test strip Use as directed Patient taking differently: Use as directed 10/27/20     Insulin Glargine (BASAGLAR KWIKPEN) 100 UNIT/ML INJECT 75 UNITS UNDER THE SKIN TWICE A DAY 09/23/19 09/22/20  BaJacelyn PiMD  Insulin Pen Needle 31G X 5 MM MISC USE AS DIRECTED SUBCUTANEOUSLY 2 TIMES DAILY 07/20/20 07/20/21  BaJacelyn PiMD  Lancets (FREESTYLE) lancets Use as directed Patient taking differently: Use as directed 10/27/20     metFORMIN (GLUCOPHAGE) 1000  MG tablet TAKE 1 TABLET BY MOUTH 2 TIMES DAILY 06/15/20 06/15/21  BaJacelyn PiMD  Multiple Vitamin (MULTIVITAMIN WITH MINERALS) TABS tablet Take 1 tablet by mouth daily.    [provider]  nortriptyline (PAMELOR) 50 MG capsule TAKE 1 OR 2 CAPSULES BY MOUTH AT BEDTIME 05/03/20 05/03/21  Plotnikov, AlEvie LacksMD  Omega-3 Fatty Acids (FISH OIL) 1000 MG CAPS 1 capsule    [provider]  sildenafil (VIAGRA) 100 MG tablet TAKE 1 TABLET BY MOUTH ONCE DAILY AS NEEDED FOR ERECTILE DYSFUNCTION 02/02/20 02/01/21  Plotnikov, Evie Lacks, MD  telmisartan-hydrochlorothiazide (MICARDIS HCT) 80-25 MG tablet Take 1 tablet by mouth daily. 08/10/19   Plotnikov, Evie Lacks, MD    Allergies    Dapagliflozin and Ramipril  Review of Systems   Review of Systems  Constitutional:  Negative for chills and fever.  Eyes:  Negative for visual disturbance.  Gastrointestinal:  Negative for nausea and vomiting.  Neurological:  Positive for headaches.  All other systems reviewed and are negative.  Physical Exam Updated Vital Signs BP (!) 189/102   Pulse 97   Temp 98.1 F (36.7 C) (Oral)   Resp 12   Ht 6' (1.829 m)   Wt (!) 142.9 kg   SpO2 95%   BMI 42.72 kg/m   Physical Exam Vitals and nursing note reviewed.  Constitutional:      Appearance: He is obese. He is not ill-appearing.  HENT:     Head: Normocephalic and atraumatic.  Eyes:     Conjunctiva/sclera: Conjunctivae normal.  Cardiovascular:     Rate and Rhythm: Normal rate and regular rhythm.  Pulmonary:     Effort: Pulmonary effort is normal.     Breath sounds: Normal breath sounds. No wheezing, rhonchi or rales.  Abdominal:     Palpations: Abdomen is soft.     Tenderness: There is no abdominal tenderness.  Musculoskeletal:     Cervical back: Neck supple.  Skin:    General: Skin is warm and dry.  Neurological:     Mental Status: He is alert.     Comments: Alert and oriented to self, place, time and event.   Speech is fluent,  clear without dysarthria or dysphasia.   Strength 5/5 in upper/lower extremities   Sensation intact in upper/lower extremities   Normal gait.  Negative Romberg. No pronator drift.  Normal finger-to-nose and feet tapping.  CN I not tested  CN II grossly intact visual fields bilaterally. Did not visualize posterior eye.  CN III, IV, VI PERRLA and EOMs intact bilaterally  CN V Intact sensation to sharp and light touch to the face  CN VII facial movements symmetric  CN VIII not tested  CN IX, X no uvula deviation, symmetric rise of soft palate  CN XI 5/5 SCM and trapezius strength bilaterally  CN XII Midline tongue protrusion, symmetric L/R movements      ED Results / Procedures / Treatments   Labs (all labs ordered are listed, but only abnormal results are displayed) Labs Reviewed  BASIC METABOLIC PANEL - Abnormal; Notable for the following components:      Result Value   Potassium 3.3 (*)    All other components within normal limits  URINALYSIS, ROUTINE W REFLEX MICROSCOPIC - Abnormal; Notable for the following components:   Protein, ur 100 (*)    All other components within normal limits  URINALYSIS, MICROSCOPIC (REFLEX) - Abnormal; Notable for the following components:   Bacteria, UA FEW (*)    All other components within normal limits  CBG MONITORING, ED - Abnormal; Notable for the following components:   Glucose-Capillary 114 (*)    All other components within normal limits  CBC WITH DIFFERENTIAL/PLATELET    EKG None  Radiology CT Head Wo Contrast  Result Date: 11/16/2020 CLINICAL DATA:  Headaches for 3 weeks. Elevated blood pressure. Dizziness. EXAM: CT HEAD WITHOUT CONTRAST TECHNIQUE: Contiguous axial images were obtained from the base of the skull through the vertex without intravenous contrast. COMPARISON:  None. FINDINGS: Brain: No evidence of acute infarction, hemorrhage, hydrocephalus,  extra-axial collection or mass lesion/mass effect. Vascular: No hyperdense  vessel or unexpected calcification. Skull: Calvarium appears intact. Sinuses/Orbits: Mucosal thickening in the paranasal sinuses. No abnormal air-fluid levels. Mastoid air cells are clear. Other: None. IMPRESSION: No acute intracranial abnormalities. Electronically Signed   By: Lucienne Capers M.D.   On: 11/16/2020 17:03    Procedures Procedures   Medications Ordered in ED Medications  metoCLOPramide (REGLAN) injection 10 mg (10 mg Intravenous Given 11/16/20 1531)  diphenhydrAMINE (BENADRYL) injection 50 mg (50 mg Intravenous Given 11/16/20 1531)  sodium chloride 0.9 % bolus 1,000 mL (0 mLs Intravenous Stopped 11/16/20 1715)    ED Course  I have reviewed the triage vital signs and the nursing notes.  Pertinent labs & imaging results that were available during my care of the patient were reviewed by me and considered in my medical decision making (see chart for details).  Clinical Course as of 11/16/20 1727  Thu Nov 16, 2020  1623 BP(!): 163/82 [MV]    Clinical Course User Index [MV] Eustaquio Maize, [place]    MDM Rules/Calculators/A&P                          55 year old male who presents to the ED today complaining of elevated blood pressure readings for the past 3 days.  Has been having intermittent headaches for the past 3 weeks however has not started checking his blood pressure until 3 days ago.  On arrival to the ED today patient is afebrile, nontachycardic and nontachypneic.  Blood pressure 182/106.  It does appear he recently had his amlodipine increased as well as started on doxazosin.  Per chart review blood pressure similar during PCP visit last month.  Suspect that his headaches are likely related to elevated blood pressure readings however will work-up for same with CT head, lab work.  Will provide headache cocktail at this time.  Will likely need to follow-up with PCP regarding elevated blood pressure for dose adjustments.  CBC without leukocytosis. Hgb stable at 13.0 BMP  with potassium 3.3. Will have patient increase potassium in diet and have PCP recheck potassium next week U/A without signs of infection. Known proteinuria.   CT Head negative and blood pressure improved to 152/80. He reports improvement in headache after headache cocktail. Will discharge home at this time with PCP follow up. Instructed to keep log of BP to see if he needs adjustment of BP meds. Pt in agreement with plan and stable for discharge home.   This note was prepared using Dragon voice recognition software and may include unintentional dictation errors due to the inherent limitations of voice recognition software.  Final Clinical Impression(s) / ED Diagnoses Final diagnoses:  Primary hypertension  Bad headache  Hypokalemia    Rx / DC Orders ED Discharge Orders     None        Discharge Instructions      Please follow up with your PCP for further evaluation of your headache and high blood pressure.  I would recommend taking your blood pressure medication daily and keeping a log of your blood pressures to take with you to your  next appointment.  Your potassium was slightly low today at 3.3. Please increase the amount of potassium in your diet (see attached resource on same) and have your PCP recheck your potassium level next week  Return to the ED for any new/worsening symptoms       Eustaquio Maize, PA-C 11/16/20 1727  Lacretia Leigh, MD 11/16/20 2248

## 2020-11-16 NOTE — ED Notes (Signed)
Pt checked his CBG w/ home device, was 63. Provided w/ sandwich and OJ.

## 2020-11-20 ENCOUNTER — Other Ambulatory Visit (HOSPITAL_COMMUNITY): Payer: Self-pay

## 2020-11-20 ENCOUNTER — Ambulatory Visit (INDEPENDENT_AMBULATORY_CARE_PROVIDER_SITE_OTHER): Payer: No Typology Code available for payment source | Admitting: Cardiology

## 2020-11-20 ENCOUNTER — Other Ambulatory Visit: Payer: Self-pay

## 2020-11-20 ENCOUNTER — Encounter: Payer: Self-pay | Admitting: Cardiology

## 2020-11-20 ENCOUNTER — Telehealth: Payer: Self-pay | Admitting: Internal Medicine

## 2020-11-20 VITALS — BP 168/92 | HR 89 | Ht 72.0 in | Wt 300.4 lb

## 2020-11-20 DIAGNOSIS — I2510 Atherosclerotic heart disease of native coronary artery without angina pectoris: Secondary | ICD-10-CM

## 2020-11-20 DIAGNOSIS — I10 Essential (primary) hypertension: Secondary | ICD-10-CM

## 2020-11-20 DIAGNOSIS — E7800 Pure hypercholesterolemia, unspecified: Secondary | ICD-10-CM | POA: Diagnosis not present

## 2020-11-20 DIAGNOSIS — G4733 Obstructive sleep apnea (adult) (pediatric): Secondary | ICD-10-CM

## 2020-11-20 DIAGNOSIS — I2584 Coronary atherosclerosis due to calcified coronary lesion: Secondary | ICD-10-CM

## 2020-11-20 DIAGNOSIS — Z6841 Body Mass Index (BMI) 40.0 and over, adult: Secondary | ICD-10-CM

## 2020-11-20 DIAGNOSIS — I700 Atherosclerosis of aorta: Secondary | ICD-10-CM

## 2020-11-20 MED ORDER — CLONIDINE HCL 0.1 MG PO TABS
0.1000 mg | ORAL_TABLET | Freq: Two times a day (BID) | ORAL | 5 refills | Status: DC
  Filled 2020-11-20: qty 60, 30d supply, fill #0

## 2020-11-20 MED ORDER — TELMISARTAN-HCTZ 80-25 MG PO TABS
1.0000 | ORAL_TABLET | Freq: Every day | ORAL | 3 refills | Status: DC
  Filled 2020-11-20: qty 30, 30d supply, fill #0
  Filled 2020-11-20: qty 60, 60d supply, fill #0
  Filled 2021-02-21: qty 90, 90d supply, fill #1
  Filled 2021-05-21: qty 90, 90d supply, fill #2
  Filled 2021-09-03: qty 30, 30d supply, fill #3
  Filled 2021-09-26: qty 30, 30d supply, fill #4

## 2020-11-20 NOTE — Telephone Encounter (Signed)
  Lucy, Please inform the patient to start Clonidine bid and see me in 7 d.  Thanks, AP   Per wife:  Hey. I am extremely worried about Charlie's blood pressure and feel like we are getting the run around and no answers nor treatment. I have been monitoring his pressures since this past Tuesday. He had a really bad HA and called out of work which is unlike him. His resting pressure was 220/110. The office referred [place] to the ED. We tried the new virtual [facility]. Again we were referred to the ED. Went to the ED. Pressures in the 190s there. They treated him for is HA and did a head CT. Again no treatment for his BP. His pressures came down to 154/80 resting. Next day pressures back to 200/100. They are staying there consistently. The ED referred him back to you for BP management.  He is scheduled to see you next Monday (6/27) which we were told was the first available appointment, and I feel like this is unacceptable considering the severity of the pressures and symptoms he is having. I am concerned about a stroke, is there any way you can see [place] sooner? It is imperative that we get his blood pressure under control asap. I appreciate your time and concern with this very important matter!

## 2020-11-20 NOTE — Progress Notes (Signed)
Cardiology Office Note   Date:  11/20/2020   ID:  Victor Byrd, DOB 26-Nov-1965, MRN [id number]  PCP:  Cassandria Anger, MD  Cardiologist:   Aliza Moret Martin[place], MD   Chief Complaint  Patient presents with   Hypertension   Coronary Artery Disease      History of Present Illness: Victor Byrd is a 55 y.o. male who is seen at the request of Dr Alain Marion for evaluation of severe HTN and elevated coronary calcium score.  He has a history of morbid obesity with OSA, HTN, HLD and DM type 2. I saw him remotely in 2013 for atypical chest pain. Stress Echo at that time was normal. He did have a recent coronary calcium score of 161 placing him at 88th percentile for age and sex.   The patient reports his BP has been running extremely high with BP readings in the 737 systolic range. This is associated with bad HA. Was seen in the ED recently. Thinks he ran out of Telmisartan HCT and clonidine in April because there were no refills. He does eat a high sodium diet. Denies SOB except when doing something strenuous. No chest pain. Has some heartburn when he doesn't eat well. He does use CPAP regularly.     Past Medical History:  Diagnosis Date   Anxiety    Asthma    Depression    Diabetes mellitus type II    Hyperlipidemia    Hypertension    Hyperthyroidism    Pt denies thyroid issues   OSA on CPAP    Dr. Chalmers Cater   Restless legs    Sleep apnea    cpap    Past Surgical History:  Procedure Laterality Date   COLONOSCOPY  11/01/2020   Schertz Cellar, MD   [place]  2007   WISDOM TOOTH EXTRACTION  1992     Current Outpatient Medications  Medication Sig Dispense Refill   albuterol (VENTOLIN HFA) 108 (90 BASE) MCG/ACT inhaler INHALE 2 PUFFS INTO THE LUNGS EVERY 6 HOURS AS NEEDED FOR WHEEZING. 3 Inhaler 3   amLODipine (NORVASC) 10 MG tablet Take 1 tablet (10 mg total) by mouth daily. 30 tablet 6   aspirin 81 MG tablet Take 81 mg by mouth daily.      Blood Glucose Monitoring Suppl (FREESTYLE FREEDOM LITE) w/Device KIT Use as directed 1 kit 0   buPROPion (WELLBUTRIN XL) 150 MG 24 hr tablet TAKE 1 TABLET BY MOUTH ONCE A DAY (NEEDS OFFICE VISIT) 90 tablet 3   chlorhexidine (PERIDEX) 0.12 % solution Use 62ms to swish in the mouth for 30 seconds then spit out 2 times per day after brushing teeth, 473 mL 3   citalopram (CELEXA) 20 MG tablet Take 1 tablet (20 mg total) by mouth daily. 90 tablet 3   doxazosin (CARDURA) 8 MG tablet Take 1/2 to 1 tablet (4-8 mg total) by mouth daily. 90 tablet 3   Dulaglutide (TRULICITY) 3 MTG/6.2IRSOPN Inject 360msubcutaneous once a week 2 mL 6   furosemide (LASIX) 20 MG tablet Take 1 to 2 tablets by mouth once a day as needed 180 tablet 0   glimepiride (AMARYL) 4 MG tablet TAKE 1 TABLET BY MOUTH TWO TIMES DAILY 180 tablet 3   glucose blood (ACCU-CHEK GUIDE) test strip Use as directed (Patient taking differently: Use as directed) 100 each 10   glucose blood test strip Use as directed (Patient taking differently: Use as  directed) 100 each 0   Insulin Glargine (BASAGLAR KWIKPEN) 100 UNIT/ML INJECT 75 UNITS UNDER THE SKIN TWICE A DAY 45 mL 4   Insulin Pen Needle 31G X 5 MM MISC USE AS DIRECTED SUBCUTANEOUSLY 2 TIMES DAILY 200 each 6   Lancets (FREESTYLE) lancets Use as directed (Patient taking differently: Use as directed) 100 each 0   metFORMIN (GLUCOPHAGE) 1000 MG tablet TAKE 1 TABLET BY MOUTH 2 TIMES DAILY 180 tablet 3   Multiple Vitamin (MULTIVITAMIN WITH MINERALS) TABS tablet Take 1 tablet by mouth daily.     nortriptyline (PAMELOR) 50 MG capsule TAKE 1 OR 2 CAPSULES BY MOUTH AT BEDTIME 180 capsule 3   Omega-3 Fatty Acids (FISH OIL) 1000 MG CAPS 1 capsule     sildenafil (VIAGRA) 100 MG tablet TAKE 1 TABLET BY MOUTH ONCE DAILY AS NEEDED FOR ERECTILE DYSFUNCTION 12 tablet 3   atorvastatin (LIPITOR) 40 MG tablet TAKE 1 TABLET BY MOUTH ONCE DAILY 60 tablet 11   cloNIDine (CATAPRES) 0.1 MG tablet Take 1 tablet (0.1 mg  total) by mouth 2 (two) times daily. 60 tablet 5   telmisartan-hydrochlorothiazide (MICARDIS HCT) 80-25 MG tablet Take 1 tablet by mouth daily. 90 tablet 3   No current facility-administered medications for this visit.    Allergies:   Dapagliflozin and Ramipril    Social History:  The patient  reports that he has never smoked. He has never used smokeless tobacco. He reports current alcohol use. He reports that he does not use drugs.   Family History:  The patient's family history includes Colon polyps in his father; Diabetes in his mother and another family member; Heart attack in his father; Heart disease in his father; Hyperlipidemia in an other family member; Hypertension in his father and another family member; Lung cancer in an other family member.    ROS:  Please see the history of present illness.   Otherwise, review of systems are positive for none.   All other systems are reviewed and negative.    PHYSICAL EXAM: VS:  BP (!) 168/92 (BP Location: Left Arm, Patient Position: Sitting, Cuff Size: Normal)   Pulse 89   Ht 6' (1.829 m)   Wt (!) 300 lb 6.4 oz (136.3 kg)   BMI 40.74 kg/m  , BMI Body mass index is 40.74 kg/m. GEN: Morbidly obese, in no acute distress HEENT: normal Neck: no JVD, carotid bruits, or masses Cardiac: RRR; no murmurs, rubs, or gallops,no edema  Respiratory:  clear to auscultation bilaterally, normal work of breathing GI: soft, nontender, nondistended, + BS MS: no deformity or atrophy Skin: warm and dry, no rash Neuro:  Strength and sensation are intact Psych: euthymic mood, full affect   EKG:  EKG is ordered today. The ekg ordered today demonstrates NSR rate 89. Right axis. Otherwise normal. I have personally reviewed and interpreted this study.    Recent Labs: 10/16/2020: ALT 42; TSH 2.36 11/16/2020: BUN 13; Creatinine, Ser 0.82; Hemoglobin 13.0; Platelets 305; Potassium 3.3; Sodium 137    Lipid Panel    Component Value Date/Time   CHOL 131  10/16/2020 1449   TRIG 133.0 10/16/2020 1449   HDL 44.70 10/16/2020 1449   CHOLHDL 3 10/16/2020 1449   VLDL 26.6 10/16/2020 1449   LDLCALC 59 10/16/2020 1449      Wt Readings from Last 3 Encounters:  11/20/20 (!) 300 lb 6.4 oz (136.3 kg)  11/16/20 (!) 315 lb (142.9 kg)  11/01/20 (!) 316 lb (143.3 kg)  Other studies Reviewed: Additional studies/ records that were reviewed today include:   Stress Echo 06/21/11: Stress Echocardiography   Patient:    Rayhan, Groleau  MR #:       [id number]  Study Date: 06/21/2011  Gender:     M  Age:        59  Height:     182.9cm  Weight:     128.6kg  BSA:        2.74m2  Pt. Status:  Room:      ORDERING     Brendolyn Stockley JMartinique MD   REFERRING    Floyde Dingley JMartinique MD   ATTENDING    Nahser, P[place] [facility]   SONOGRAPHER  SBarbra Sarks RDCS   REFERRING    Plotnikov, Alex  cc:   ------------------------------------------------------------   ------------------------------------------------------------  Indications:      Chest pain 786.51.   ------------------------------------------------------------  History:   PMH:   Chest pain.  Risk factors:  Sleep apnea.  Hypertension. Diabetes mellitus. Morbidly obese.  Dyslipidemia.                Bruce protocol. Stress  echocardiography.  Height:  Height: 182.9cm. Height: 72in.  Weight:  Weight: 128.6kg. Weight: 283lb.  Body mass index:  BMI: 38.5kg/m^2.  Body surface area:    BSA: 2.685m.  Blood  pressure:     141/70.  Patient status:  Outpatient.   ------------------------------------------------------------   ------------------------------------------------------------  Stress protocol:   +--------------------+---+------------+--------------------+  Stage               HR BP (mmHg)   Symptoms              +--------------------+---+------------+--------------------+  Baseline            77 141/70 (94) None                   +--------------------+---+------------+--------------------+  Stage 1             122164/51 (89) None                  +--------------------+---+------------+--------------------+  Stage 2             133203/80 (121)Dyspnea, Legs                                            burning               +--------------------+---+------------+--------------------+  Stage 3             155--------------------------------  +--------------------+---+------------+--------------------+  Immediate post      150209/70 (116)Legs cramping         stress                                                   +--------------------+---+------------+--------------------+  Recovery; 1 min     130203/85 (124)Subsiding             +--------------------+---+------------+--------------------+  Recovery; 2 min     111192/69 (110)Subsiding             +--------------------+---+------------+--------------------+  Recovery; 3 min     105172/56 (95) Subsiding             +--------------------+---+------------+--------------------+  Recovery; 4 min     100161/48 (86) None                  +--------------------+---+------------+--------------------+  Recovery; 5 min     100--------------------------------  +--------------------+---+------------+--------------------+  Late recovery; 6 min100140/65 (90) None                  +--------------------+---+------------+--------------------+   ------------------------------------------------------------  Stress results:   Maximal heart rate during stress was  155bpm (87% of maximal predicted heart rate). The maximal  predicted heart rate was 177bpm.The target heart rate was  achieved. The heart rate response to stress was normal.  There was a normal resting blood pressure with a  hypertensive response to stress. The rate-pressure product  for the peak heart rate and blood pressure was 3136m  Hg/min.  The patient  experienced no chest pain during  stress.   ------------------------------------------------------------  Stress ECG:  There are no ST or T wave changes to suggest  ischemia.   ------------------------------------------------------------  Baseline:   - LV global systolic function was normal.  - Normal wall motion; no LV regional wall motion    abnormalities.  Peak stress:   - LV global systolic function was vigorous.  - No evidence for new LV regional wall motion abnormalities.   ------------------------------------------------------------  Stress echo results:     This is intrepreted as a normal  stress echocardiogram. There is no evidence of ischemia.  Normal LV function.   ------------------------------------------------------------  Prepared and Electronically Authenticated by   NMertie Moores 2013-01-18T15:19:20.230  ADDENDUM REPORT: 10/27/2020 14:47   CLINICAL DATA:  66M with hypertension and obesity for cardiovascular disease risk stratification   EXAM: Coronary Calcium Score   TECHNIQUE: A gated, non-contrast computed tomography scan of the heart was performed using 332mslice thickness. Axial images were analyzed on a dedicated workstation. Calcium scoring of the coronary arteries was performed using the Agatston method.   FINDINGS: Coronary arteries: Normal origins.   Coronary Calcium Score:   Left main: 0   Left anterior descending artery: 49.8   Left circumflex artery: 103   Right coronary artery: 7.82   Total: 161   Percentile: 88th   Pericardium: Normal.   Ascending Aorta: Normal caliber.   [facility]cardiac: [facility]   IMPRESSION: Coronary calcium score of 161. This was 88th percentile for age-, race-, and sex-matched controls.   RECOMMENDATIONS: Coronary artery calcium (CAC) score is a strong predictor of incident coronary heart disease (CHD) and provides predictive information beyond traditional  risk factors. CAC scoring is reasonable to use in the decision to withhold, postpone, or initiate statin therapy in intermediate-risk or selected borderline-risk asymptomatic adults (age 53-75ears and LDL-C >=70 to <190 mg/dL) who do not have diabetes or established atherosclerotic cardiovascular disease (ASCVD).* In intermediate-risk (10-year ASCVD risk >=7.5% to <20%) adults or selected borderline-risk (10-year ASCVD risk >=5% to <7.5%) adults in whom a CAC score is measured for the purpose of making a treatment decision the following recommendations have been made:   If CAC=0, it is reasonable to withhold statin therapy and reassess in 5 to 10 years, as long as higher risk conditions are absent (diabetes mellitus, family history of premature CHD in first degree relatives (males <55 years; females <65 years), cigarette smoking, or LDL >=190 mg/dL).   If CAC is 1 to 99, it is reasonable to initiate statin therapy for patients >=5543ears of age.   If CAC is >=100 or >=75th percentile, it  is reasonable to initiate statin therapy at any age.   Cardiology referral should be considered for patients with CAC scores >=400 or >=75th percentile.   *2018 AHA/ACC/AACVPR/AAPA/ABC/ACPM/ADA/AGS/APhA/ASPC/NLA/PCNA Guideline on the Management of Blood Cholesterol: A Report of the American College of Cardiology/American Heart Association Task Force on Clinical Practice Guidelines. J Am Coll Cardiol. 2019;73(24):3168-3209.   Skeet Latch, MD     Electronically Signed   By: Skeet Latch   On: 10/27/2020 14:47     ASSESSMENT AND PLAN:  1.  Poorly controlled HTN. On multiple medications. I made sure he has refills on all BP medication including Telmisartan HCT. Will arrange for a renal duplex. Instructed him on the need for low sodium diet. Now on Clonidine, amlodipine, Cardura, and Telmisartan HCT. Will have him record BP daily and keep a diary. It needed could add a beta  blocker. 2. HLD well controlled on atorvastatin 3. DM poorly controlled. Last A1c 9.1%. follow up with Dr Chalmers Cater. 4. Coronary artery calcification. Currently asymptomatic. Discussed that this is a marker of increased CV risk. At this point need to focus on aggressive risk factor modification. Discussed that if he develops any chest pain, increased dyspnea or fatigue he needs to let me know. 5. Aortic atherosclerosis. Also focus on risk factor modification 6. Morbid obesity with OSA. On CPAP.    Current medicines are reviewed at length with the patient today.  The patient does not have concerns regarding medicines.  The following changes have been made:  no change  Labs/ tests ordered today include:   Orders Placed This Encounter  Procedures   EKG 12-Lead   VAS US RENAL ARTERY DUPLEX      Disposition:   FU with me in 3 months  Signed, Caetano Oberhaus Martinique, MD  11/20/2020 3:30 PM    [facility] [address], [place], [place], [address] Phone [phone], Fax [phone]

## 2020-11-20 NOTE — Telephone Encounter (Signed)
Message was sent to pt wife with MD response   " Plotnikov, Evie Lacks, MD  Earnstine Regal, RMA I'm sorry! He needs to start Clonidine and see me in 1 week. We will call him. Rx emailed. Please communicate via [facility] if possible. " Thanks,  AP

## 2020-11-20 NOTE — Patient Instructions (Signed)
Medication Instructions:   Continue same medications  *If you need a refill on your cardiac medications before your next appointment, please call your pharmacy*   Lab Work:  None  ordered   Testing/Procedures:  Schedule Renal Duplex   Follow-Up: At [facility], you and your health needs are our priority.  As part of our continuing mission to provide you with exceptional heart care, we have created designated Provider Care Teams.  These Care Teams include your primary Cardiologist (physician) and Advanced Practice Providers (APPs -  Physician Assistants and Nurse Practitioners) who all work together to provide you with the care you need, when you need it.  We recommend signing up for the patient portal called "MyChart".  Sign up information is provided on this After Visit Summary.  MyChart is used to connect with patients for Virtual Visits (Telemedicine).  Patients are able to view lab/test results, encounter notes, upcoming appointments, etc.  Non-urgent messages can be sent to your provider as well.   To learn more about what you can do with MyChart, go to [email or web address].    Your next appointment:  3 months   The format for your next appointment:  Office     Provider:  Dr.Jordan  Check blood pressure daily and keep a diary

## 2020-11-21 ENCOUNTER — Other Ambulatory Visit (HOSPITAL_COMMUNITY): Payer: Self-pay

## 2020-11-23 ENCOUNTER — Other Ambulatory Visit (HOSPITAL_COMMUNITY): Payer: Self-pay

## 2020-11-24 ENCOUNTER — Other Ambulatory Visit (HOSPITAL_COMMUNITY): Payer: Self-pay

## 2020-11-24 MED ORDER — FREESTYLE LITE W/DEVICE KIT
PACK | 0 refills | Status: DC
  Filled 2020-11-24: qty 1, 1d supply, fill #0

## 2020-11-27 ENCOUNTER — Ambulatory Visit (INDEPENDENT_AMBULATORY_CARE_PROVIDER_SITE_OTHER): Payer: No Typology Code available for payment source | Admitting: Internal Medicine

## 2020-11-27 ENCOUNTER — Other Ambulatory Visit (HOSPITAL_COMMUNITY): Payer: Self-pay

## 2020-11-27 ENCOUNTER — Encounter: Payer: Self-pay | Admitting: Internal Medicine

## 2020-11-27 ENCOUNTER — Other Ambulatory Visit: Payer: Self-pay

## 2020-11-27 VITALS — BP 158/72 | HR 81 | Temp 98.2°F | Ht 72.0 in | Wt 303.6 lb

## 2020-11-27 DIAGNOSIS — IMO0002 Reserved for concepts with insufficient information to code with codable children: Secondary | ICD-10-CM

## 2020-11-27 DIAGNOSIS — I10 Essential (primary) hypertension: Secondary | ICD-10-CM

## 2020-11-27 DIAGNOSIS — Z6841 Body Mass Index (BMI) 40.0 and over, adult: Secondary | ICD-10-CM

## 2020-11-27 DIAGNOSIS — F339 Major depressive disorder, recurrent, unspecified: Secondary | ICD-10-CM | POA: Diagnosis not present

## 2020-11-27 DIAGNOSIS — E1165 Type 2 diabetes mellitus with hyperglycemia: Secondary | ICD-10-CM

## 2020-11-27 DIAGNOSIS — E1140 Type 2 diabetes mellitus with diabetic neuropathy, unspecified: Secondary | ICD-10-CM | POA: Diagnosis not present

## 2020-11-27 MED ORDER — CLONIDINE HCL 0.2 MG PO TABS
0.2000 mg | ORAL_TABLET | Freq: Two times a day (BID) | ORAL | 11 refills | Status: DC
  Filled 2020-11-27: qty 60, 30d supply, fill #0
  Filled 2021-01-08: qty 60, 30d supply, fill #1
  Filled 2021-02-05: qty 60, 30d supply, fill #2
  Filled 2021-03-09: qty 60, 30d supply, fill #3
  Filled 2021-04-01: qty 60, 30d supply, fill #4
  Filled 2021-05-04: qty 60, 30d supply, fill #5
  Filled 2021-06-06: qty 60, 30d supply, fill #6
  Filled 2021-07-01: qty 60, 30d supply, fill #7
  Filled 2021-08-01: qty 60, 30d supply, fill #8
  Filled 2021-09-10: qty 60, 30d supply, fill #9
  Filled 2021-10-03: qty 60, 30d supply, fill #10
  Filled 2021-11-04: qty 60, 30d supply, fill #11

## 2020-11-27 MED ORDER — INSULIN GLARGINE-YFGN 100 UNIT/ML ~~LOC~~ SOPN
PEN_INJECTOR | SUBCUTANEOUS | 4 refills | Status: DC
  Filled 2020-11-27: qty 135, 90d supply, fill #0
  Filled 2021-04-27: qty 135, 90d supply, fill #1

## 2020-11-27 MED ORDER — BASAGLAR KWIKPEN 100 UNIT/ML ~~LOC~~ SOPN
PEN_INJECTOR | SUBCUTANEOUS | 4 refills | Status: DC
  Filled 2020-11-27: qty 45, 30d supply, fill #0

## 2020-11-27 MED ORDER — CLONIDINE HCL 0.2 MG PO TABS: 0.2000 mg | ORAL_TABLET | Freq: Two times a day (BID) | ORAL | 11 refills | Status: DC

## 2020-11-27 NOTE — Assessment & Plan Note (Signed)
Pt will call Dr Chalmers Cater Ref for wt loss surgery made

## 2020-11-27 NOTE — Progress Notes (Signed)
Subjective:  Patient ID: Victor Byrd, male    DOB: 14-Jan-1966  Age: 55 y.o. MRN: [id number]  CC: Hypertension   HPI Victor Byrd presents for elevated BP - on leave of absence x 2 wks SBP was in 200 before - there was some confusion about his meds - corrected now. He had severe HAs, no HA now. Out of insulin today.  Outpatient Medications Prior to Visit  Medication Sig Dispense Refill   albuterol (VENTOLIN HFA) 108 (90 BASE) MCG/ACT inhaler INHALE 2 PUFFS INTO THE LUNGS EVERY 6 HOURS AS NEEDED FOR WHEEZING. 3 Inhaler 3   amLODipine (NORVASC) 10 MG tablet Take 1 tablet (10 mg total) by mouth daily. 30 tablet 6   aspirin 81 MG tablet Take 81 mg by mouth daily.     atorvastatin (LIPITOR) 40 MG tablet TAKE 1 TABLET BY MOUTH ONCE DAILY 60 tablet 11   Blood Glucose Monitoring Suppl (FREESTYLE FREEDOM LITE) w/Device KIT Use as directed 1 kit 0   Blood Glucose Monitoring Suppl (FREESTYLE LITE) w/Device KIT Use as directed to replace defective meter 1 kit 0   buPROPion (WELLBUTRIN XL) 150 MG 24 hr tablet TAKE 1 TABLET BY MOUTH ONCE A DAY (NEEDS OFFICE VISIT) 90 tablet 3   chlorhexidine (PERIDEX) 0.12 % solution Use 87ms to swish in the mouth for 30 seconds then spit out 2 times per day after brushing teeth, 473 mL 3   citalopram (CELEXA) 20 MG tablet Take 1 tablet (20 mg total) by mouth daily. 90 tablet 3   cloNIDine (CATAPRES) 0.1 MG tablet Take 1 tablet (0.1 mg total) by mouth 2 (two) times daily. 60 tablet 5   doxazosin (CARDURA) 8 MG tablet Take 1/2 to 1 tablet (4-8 mg total) by mouth daily. 90 tablet 3   Dulaglutide (TRULICITY) 3 MZT/2.4PYSOPN Inject 33msubcutaneous once a week 2 mL 6   furosemide (LASIX) 20 MG tablet Take 1 to 2 tablets by mouth once a day as needed 180 tablet 0   glimepiride (AMARYL) 4 MG tablet TAKE 1 TABLET BY MOUTH TWO TIMES DAILY 180 tablet 3   glucose blood (ACCU-CHEK GUIDE) test strip Use as directed (Patient taking differently: Use as directed) 100 each 10    glucose blood test strip Use as directed (Patient taking differently: Use as directed) 100 each 0   Insulin Glargine (BASAGLAR KWIKPEN) 100 UNIT/ML INJECT 75 UNITS UNDER THE SKIN TWICE A DAY 45 mL 4   Insulin Pen Needle 31G X 5 MM MISC USE AS DIRECTED SUBCUTANEOUSLY 2 TIMES DAILY 200 each 6   Lancets (FREESTYLE) lancets Use as directed (Patient taking differently: Use as directed) 100 each 0   metFORMIN (GLUCOPHAGE) 1000 MG tablet TAKE 1 TABLET BY MOUTH 2 TIMES DAILY 180 tablet 3   Multiple Vitamin (MULTIVITAMIN WITH MINERALS) TABS tablet Take 1 tablet by mouth daily.     nortriptyline (PAMELOR) 50 MG capsule TAKE 1 OR 2 CAPSULES BY MOUTH AT BEDTIME 180 capsule 3   Omega-3 Fatty Acids (FISH OIL) 1000 MG CAPS 1 capsule     sildenafil (VIAGRA) 100 MG tablet TAKE 1 TABLET BY MOUTH ONCE DAILY AS NEEDED FOR ERECTILE DYSFUNCTION 12 tablet 3   telmisartan-hydrochlorothiazide (MICARDIS HCT) 80-25 MG tablet Take 1 tablet by mouth daily. 90 tablet 3   No facility-administered medications prior to visit.    ROS: Review of Systems  Constitutional:  Positive for fatigue. Negative for appetite change and unexpected weight change.  HENT:  Negative  for congestion, nosebleeds, sneezing, sore throat and trouble swallowing.   Eyes:  Negative for itching and visual disturbance.  Respiratory:  Negative for cough.   Cardiovascular:  Negative for chest pain, palpitations and leg swelling.  Gastrointestinal:  Negative for abdominal distention, blood in stool, diarrhea and nausea.  Genitourinary:  Negative for frequency and hematuria.  Musculoskeletal:  Negative for back pain, gait problem, joint swelling and neck pain.  Skin:  Negative for rash.  Neurological:  Negative for dizziness, tremors, speech difficulty and weakness.  Psychiatric/Behavioral:  Negative for agitation, dysphoric mood and sleep disturbance. The patient is not nervous/anxious.    Objective:  BP (!) 158/72 (BP Location: Left Arm)   Pulse  81   Temp 98.2 F (36.8 C) (Oral)   Ht 6' (1.829 m)   Wt (!) 303 lb 9.6 oz (137.7 kg)   SpO2 96%   BMI 41.18 kg/m   BP Readings from Last 3 Encounters:  11/27/20 (!) 158/72  11/20/20 (!) 168/92  11/16/20 (!) 152/80    Wt Readings from Last 3 Encounters:  11/27/20 (!) 303 lb 9.6 oz (137.7 kg)  11/20/20 (!) 300 lb 6.4 oz (136.3 kg)  11/16/20 (!) 315 lb (142.9 kg)    Physical Exam Constitutional:      General: He is not in acute distress.    Appearance: He is well-developed. He is obese.     Comments: NAD  Eyes:     Conjunctiva/sclera: Conjunctivae normal.     Pupils: Pupils are equal, round, and reactive to light.  Neck:     Thyroid: No thyromegaly.     Vascular: No JVD.  Cardiovascular:     Rate and Rhythm: Normal rate and regular rhythm.     Heart sounds: Normal heart sounds. No murmur heard.   No friction rub. No gallop.  Pulmonary:     Effort: Pulmonary effort is normal. No respiratory distress.     Breath sounds: Normal breath sounds. No wheezing or rales.  Chest:     Chest wall: No tenderness.  Abdominal:     General: Bowel sounds are normal. There is no distension.     Palpations: Abdomen is soft. There is no mass.     Tenderness: There is no abdominal tenderness. There is no guarding or rebound.  Musculoskeletal:        General: No tenderness. Normal range of motion.     Cervical back: Normal range of motion.  Lymphadenopathy:     Cervical: No cervical adenopathy.  Skin:    General: Skin is warm and dry.     Findings: No rash.  Neurological:     Mental Status: He is alert and oriented to person, place, and time.     Cranial Nerves: No cranial nerve deficit.     Motor: No abnormal muscle tone.     Coordination: Coordination normal.     Gait: Gait normal.     Deep Tendon Reflexes: Reflexes are normal and symmetric.  Psychiatric:        Behavior: Behavior normal.        Thought Content: Thought content normal.        Judgment: Judgment normal.     Lab Results  Component Value Date   WBC 9.5 11/16/2020   HGB 13.0 11/16/2020   HCT 39.5 11/16/2020   PLT 305 11/16/2020   GLUCOSE 88 11/16/2020   CHOL 131 10/16/2020   TRIG 133.0 10/16/2020   HDL 44.70 10/16/2020   LDLCALC 59 10/16/2020  ALT 42 10/16/2020   AST 25 10/16/2020   NA 137 11/16/2020   K 3.3 (L) 11/16/2020   CL 99 11/16/2020   CREATININE 0.82 11/16/2020   BUN 13 11/16/2020   CO2 30 11/16/2020   TSH 2.36 10/16/2020   PSA 0.70 10/16/2020   HGBA1C 9.1 (H) 10/16/2020   MICROALBUR 96.7 (H) 10/16/2020    CT Head Wo Contrast  Result Date: 11/16/2020 CLINICAL DATA:  Headaches for 3 weeks. Elevated blood pressure. Dizziness. EXAM: CT HEAD WITHOUT CONTRAST TECHNIQUE: Contiguous axial images were obtained from the base of the skull through the vertex without intravenous contrast. COMPARISON:  None. FINDINGS: Brain: No evidence of acute infarction, hemorrhage, hydrocephalus, extra-axial collection or mass lesion/mass effect. Vascular: No hyperdense vessel or unexpected calcification. Skull: Calvarium appears intact. Sinuses/Orbits: Mucosal thickening in the paranasal sinuses. No abnormal air-fluid levels. Mastoid air cells are clear. Other: None. IMPRESSION: No acute intracranial abnormalities. Electronically Signed   By: Lucienne Capers M.D.   On: 11/16/2020 17:03    Assessment & Plan:     Walker Kehr, MD

## 2020-11-27 NOTE — Assessment & Plan Note (Addendum)
Discussed Gen surgery ref was placed

## 2020-11-27 NOTE — Assessment & Plan Note (Addendum)
Stop eating sea salt chips Increase Clonidine to 0.2 mg bid Wt loss surgery discussed HTN clinic ref was offered - pt declined

## 2020-11-27 NOTE — Assessment & Plan Note (Signed)
Discussed.

## 2020-11-28 ENCOUNTER — Other Ambulatory Visit (HOSPITAL_COMMUNITY): Payer: Self-pay

## 2020-11-28 ENCOUNTER — Telehealth: Payer: Self-pay | Admitting: Internal Medicine

## 2020-11-28 NOTE — Telephone Encounter (Signed)
Type of form received: disability  Additional comments:   Received by: Theodosia Quay  Form should be Faxed to: [phone]  Form should be mailed to:  n/a  Is patient requesting call for pickup: n/a   Form placed in the Provider's box.  Attach charge sheet.  Provider will determine charge.  Was patient informed of  7-10 business day turn around (Y/N)? no

## 2020-11-29 NOTE — Telephone Encounter (Signed)
Place on MD desk (in purple folder) for completion.Marland KitchenJohny Byrd

## 2020-12-05 NOTE — Telephone Encounter (Signed)
   Patient calling for status of LOA paperwork. He is ready to return to work

## 2020-12-06 ENCOUNTER — Other Ambulatory Visit (HOSPITAL_COMMUNITY): Payer: Self-pay

## 2020-12-06 MED ORDER — ALBUTEROL SULFATE HFA 108 (90 BASE) MCG/ACT IN AERS
INHALATION_SPRAY | RESPIRATORY_TRACT | 1 refills | Status: DC
  Filled 2020-12-06: qty 18, 25d supply, fill #0
  Filled 2021-03-19: qty 18, 25d supply, fill #1

## 2020-12-06 NOTE — Telephone Encounter (Signed)
   Patient calling, states LOA forms due today to [facility]  Please call

## 2020-12-07 ENCOUNTER — Telehealth: Payer: Self-pay | Admitting: Cardiology

## 2020-12-07 ENCOUNTER — Other Ambulatory Visit: Payer: Self-pay | Admitting: Cardiology

## 2020-12-07 DIAGNOSIS — I10 Essential (primary) hypertension: Secondary | ICD-10-CM

## 2020-12-07 NOTE — Telephone Encounter (Signed)
[name], Has it been done? Thanks

## 2020-12-07 NOTE — Telephone Encounter (Signed)
Forms were sent in from [place], provider stated that PCP would need to be the one to complete the forms.  Attempted to contact patient to inform him the PCP would need to complete, called 3 times. patient hung up each time.

## 2020-12-07 NOTE — Telephone Encounter (Signed)
   Patient called and was wondering if his LOA states that he will return to work on 7/9. He states that that is when he is wanting to return. He is requesting a call back and can be reached at [phone].

## 2020-12-07 NOTE — Telephone Encounter (Signed)
I gave this to you on Thursday.. U wanted to know the dates and he sent email w/ the dates.. I have not seen forms, nor is anything been downloaded into the chart.Marland KitchenJohny Chess

## 2020-12-08 NOTE — Telephone Encounter (Signed)
Lucy, I do not have exact recollection.  However, if I had not I signed it. Could someone else besides you handled it? Thanks,      "I have been out of work since the 14th.  My plan is to go back on Saturday the 2nd.  My follow up with all of my doctors are scheduled to fit my work schedule. If u don't think I should go back this Saturday , let me know.  Do you want want dates of all of my visiting dates?  Like when I went to the ER, Dr, [place] etc?  Let me know if you need specific details"

## 2020-12-08 NOTE — Telephone Encounter (Signed)
Called [employer]  : [phone] . Pt [name] spoke w/ rep she is facing over new forms to be completed. Notified pt via mychart status of forms.Marland KitchenJohny Byrd

## 2020-12-08 NOTE — Telephone Encounter (Signed)
Patient called back   States that [employer] faxed over the paperwork originally and he was told just to provide the our fax number. Requesting that we call them to have it re-faxed to [place] so that paperwork can be submitted.   [facility] # : [phone]   WIN: [phone]  Please advise.

## 2020-12-11 NOTE — Telephone Encounter (Signed)
MD fought LOA forms and completed. Fax this am to [facility] and notified pt to pick-up original. See mychart msg...Johny Chess

## 2020-12-15 ENCOUNTER — Other Ambulatory Visit (HOSPITAL_COMMUNITY): Payer: Self-pay

## 2020-12-21 ENCOUNTER — Other Ambulatory Visit (HOSPITAL_COMMUNITY): Payer: Self-pay

## 2020-12-21 MED FILL — Metformin HCl Tab 1000 MG: ORAL | 90 days supply | Qty: 180 | Fill #0 | Status: AC

## 2020-12-22 ENCOUNTER — Other Ambulatory Visit (HOSPITAL_COMMUNITY): Payer: Self-pay

## 2020-12-22 ENCOUNTER — Other Ambulatory Visit: Payer: Self-pay

## 2020-12-22 ENCOUNTER — Ambulatory Visit (HOSPITAL_COMMUNITY)
Admission: RE | Admit: 2020-12-22 | Discharge: 2020-12-22 | Disposition: A | Discharge status: Home / Self Care | Payer: No Typology Code available for payment source | Source: Ambulatory Visit | Attending: Cardiovascular Disease | Admitting: Cardiovascular Disease

## 2020-12-22 DIAGNOSIS — I10 Essential (primary) hypertension: Secondary | ICD-10-CM | POA: Diagnosis not present

## 2021-01-08 ENCOUNTER — Encounter: Payer: Self-pay | Admitting: Internal Medicine

## 2021-01-08 ENCOUNTER — Ambulatory Visit (INDEPENDENT_AMBULATORY_CARE_PROVIDER_SITE_OTHER): Payer: No Typology Code available for payment source | Admitting: Internal Medicine

## 2021-01-08 ENCOUNTER — Other Ambulatory Visit: Payer: Self-pay

## 2021-01-08 ENCOUNTER — Other Ambulatory Visit (HOSPITAL_COMMUNITY): Payer: Self-pay

## 2021-01-08 DIAGNOSIS — E1140 Type 2 diabetes mellitus with diabetic neuropathy, unspecified: Secondary | ICD-10-CM

## 2021-01-08 DIAGNOSIS — I10 Essential (primary) hypertension: Secondary | ICD-10-CM | POA: Diagnosis not present

## 2021-01-08 DIAGNOSIS — I2510 Atherosclerotic heart disease of native coronary artery without angina pectoris: Secondary | ICD-10-CM | POA: Diagnosis not present

## 2021-01-08 DIAGNOSIS — E1165 Type 2 diabetes mellitus with hyperglycemia: Secondary | ICD-10-CM

## 2021-01-08 DIAGNOSIS — I2583 Coronary atherosclerosis due to lipid rich plaque: Secondary | ICD-10-CM

## 2021-01-08 DIAGNOSIS — Z6841 Body Mass Index (BMI) 40.0 and over, adult: Secondary | ICD-10-CM

## 2021-01-08 DIAGNOSIS — IMO0002 Reserved for concepts with insufficient information to code with codable children: Secondary | ICD-10-CM

## 2021-01-08 MED ORDER — GLIMEPIRIDE 4 MG PO TABS
4.0000 mg | ORAL_TABLET | Freq: Two times a day (BID) | ORAL | 4 refills | Status: DC
  Filled 2021-01-08: qty 180, 90d supply, fill #0
  Filled 2021-04-16: qty 180, 90d supply, fill #1
  Filled 2021-07-09: qty 60, 30d supply, fill #2
  Filled 2021-08-17: qty 60, 30d supply, fill #3
  Filled 2021-09-10: qty 60, 30d supply, fill #4
  Filled 2021-10-11: qty 60, 30d supply, fill #5
  Filled 2021-11-09: qty 60, 30d supply, fill #6
  Filled 2021-12-21: qty 180, 90d supply, fill #7

## 2021-01-08 NOTE — Assessment & Plan Note (Signed)
On ASA and Lipitor

## 2021-01-08 NOTE — Assessment & Plan Note (Signed)
Increased Clonidine to 0.2 mg bid - better at home. No BP spikes

## 2021-01-08 NOTE — Assessment & Plan Note (Signed)
Mounjaro info given to discuss w/Dr Chalmers Cater

## 2021-01-08 NOTE — Progress Notes (Signed)
Subjective:  Patient ID: Victor Byrd, male    DOB: [birth year]  Age: 55 y.o. MRN: [id number]  CC: Follow-up (6 weeks f/u)   HPI Victor Byrd presents for HTN, DM, obesity, CAD f/u  Eating better; NAS   Outpatient Medications Prior to Visit  Medication Sig Dispense Refill   albuterol (VENTOLIN HFA) 108 (90 Base) MCG/ACT inhaler INHALE 2 PUFFS INTO THE LUNGS EVERY 6 HOURS AS NEEDED FOR WHEEZING. 18 g 1   amLODipine (NORVASC) 10 MG tablet Take 1 tablet (10 mg total) by mouth daily. 30 tablet 6   aspirin 81 MG tablet Take 81 mg by mouth daily.     Blood Glucose Monitoring Suppl (FREESTYLE FREEDOM LITE) w/Device KIT Use as directed 1 kit 0   buPROPion (WELLBUTRIN XL) 150 MG 24 hr tablet TAKE 1 TABLET BY MOUTH ONCE A DAY (NEEDS OFFICE VISIT) 90 tablet 3   chlorhexidine (PERIDEX) 0.12 % solution Use 68ms to swish in the mouth for 30 seconds then spit out 2 times per day after brushing teeth, 473 mL 3   citalopram (CELEXA) 20 MG tablet Take 1 tablet (20 mg total) by mouth daily. 90 tablet 3   cloNIDine (CATAPRES) 0.2 MG tablet Take 1 tablet (0.2 mg total) by mouth 2 (two) times daily. 60 tablet 11   doxazosin (CARDURA) 8 MG tablet Take 1/2 to 1 tablet (4-8 mg total) by mouth daily. 90 tablet 3   Dulaglutide (TRULICITY) 3 MXF/0.7KUSOPN Inject 327msubcutaneous once a week 2 mL 6   furosemide (LASIX) 20 MG tablet Take 1 to 2 tablets by mouth once a day as needed 180 tablet 0   glimepiride (AMARYL) 4 MG tablet TAKE 1 TABLET BY MOUTH TWO TIMES DAILY 180 tablet 3   glucose blood (ACCU-CHEK GUIDE) test strip Use as directed (Patient taking differently: Use as directed) 100 each 10   glucose blood test strip Use as directed (Patient taking differently: Use as directed) 100 each 0   Insulin Glargine-yfgn (SEMGLEE, YFGN,) 100 UNIT/ML SOPN Inject 75 units subcutaneous twice a day 135 mL 4   Insulin Pen Needle 31G X 5 MM MISC USE AS DIRECTED SUBCUTANEOUSLY 2 TIMES DAILY 200 each 6   Lancets  (FREESTYLE) lancets Use as directed (Patient taking differently: Use as directed) 100 each 0   metFORMIN (GLUCOPHAGE) 1000 MG tablet TAKE 1 TABLET BY MOUTH 2 TIMES DAILY 180 tablet 3   Multiple Vitamin (MULTIVITAMIN WITH MINERALS) TABS tablet Take 1 tablet by mouth daily.     nortriptyline (PAMELOR) 50 MG capsule TAKE 1 OR 2 CAPSULES BY MOUTH AT BEDTIME 180 capsule 3   Omega-3 Fatty Acids (FISH OIL) 1000 MG CAPS 1 capsule     sildenafil (VIAGRA) 100 MG tablet TAKE 1 TABLET BY MOUTH ONCE DAILY AS NEEDED FOR ERECTILE DYSFUNCTION 12 tablet 3   telmisartan-hydrochlorothiazide (MICARDIS HCT) 80-25 MG tablet Take 1 tablet by mouth daily. 90 tablet 3   atorvastatin (LIPITOR) 40 MG tablet TAKE 1 TABLET BY MOUTH ONCE DAILY 60 tablet 11   cloNIDine (CATAPRES) 0.2 MG tablet Take 1 tablet (0.2 mg total) by mouth 2 (two) times daily. 60 tablet 11   Insulin Glargine (BASAGLAR KWIKPEN) 100 UNIT/ML INJECT 75 UNITS UNDER THE SKIN TWICE A DAY 45 mL 4   No facility-administered medications prior to visit.    ROS: Review of Systems  Constitutional:  Positive for fatigue. Negative for appetite change and unexpected weight change.  HENT:  Negative for congestion, nosebleeds,  sneezing, sore throat and trouble swallowing.   Eyes:  Negative for itching and visual disturbance.  Respiratory:  Negative for cough.   Cardiovascular:  Negative for chest pain, palpitations and leg swelling.  Gastrointestinal:  Negative for abdominal distention, blood in stool, diarrhea and nausea.  Genitourinary:  Negative for frequency and hematuria.  Musculoskeletal:  Negative for back pain, gait problem, joint swelling and neck pain.  Skin:  Negative for rash.  Neurological:  Negative for dizziness, tremors, speech difficulty and weakness.  Psychiatric/Behavioral:  Negative for agitation, dysphoric mood and sleep disturbance. The patient is not nervous/anxious.    Objective:  BP (!) 162/80 (BP Location: Left Arm)   Pulse 88   Temp  98.3 F (36.8 C) (Oral)   Ht 6' (1.829 m)   Wt (!) 306 lb 6.4 oz (139 kg)   SpO2 98%   BMI 41.56 kg/m   BP Readings from Last 3 Encounters:  01/08/21 (!) 162/80  11/27/20 (!) 158/72  11/20/20 (!) 168/92    Wt Readings from Last 3 Encounters:  01/08/21 (!) 306 lb 6.4 oz (139 kg)  11/27/20 (!) 303 lb 9.6 oz (137.7 kg)  11/20/20 (!) 300 lb 6.4 oz (136.3 kg)    Physical Exam Constitutional:      General: He is not in acute distress.    Appearance: He is well-developed. He is obese.     Comments: NAD  Eyes:     Conjunctiva/sclera: Conjunctivae normal.     Pupils: Pupils are equal, round, and reactive to light.  Neck:     Thyroid: No thyromegaly.     Vascular: No JVD.  Cardiovascular:     Rate and Rhythm: Normal rate and regular rhythm.     Heart sounds: Normal heart sounds. No murmur heard.   No friction rub. No gallop.  Pulmonary:     Effort: Pulmonary effort is normal. No respiratory distress.     Breath sounds: Normal breath sounds. No wheezing or rales.  Chest:     Chest wall: No tenderness.  Abdominal:     General: Bowel sounds are normal. There is no distension.     Palpations: Abdomen is soft. There is no mass.     Tenderness: There is no abdominal tenderness. There is no guarding or rebound.  Musculoskeletal:        General: Tenderness present. Normal range of motion.     Cervical back: Normal range of motion.  Lymphadenopathy:     Cervical: No cervical adenopathy.  Skin:    General: Skin is warm and dry.     Findings: No rash.  Neurological:     Mental Status: He is alert and oriented to person, place, and time.     Cranial Nerves: No cranial nerve deficit.     Motor: No abnormal muscle tone.     Coordination: Coordination normal.     Gait: Gait normal.     Deep Tendon Reflexes: Reflexes are normal and symmetric.  Psychiatric:        Behavior: Behavior normal.        Thought Content: Thought content normal.        Judgment: Judgment normal.   Obese LS - painful w/ROM  Lab Results  Component Value Date   WBC 9.5 11/16/2020   HGB 13.0 11/16/2020   HCT 39.5 11/16/2020   PLT 305 11/16/2020   GLUCOSE 88 11/16/2020   CHOL 131 10/16/2020   TRIG 133.0 10/16/2020   HDL 44.70 10/16/2020  LDLCALC 59 10/16/2020   ALT 42 10/16/2020   AST 25 10/16/2020   NA 137 11/16/2020   K 3.3 (L) 11/16/2020   CL 99 11/16/2020   CREATININE 0.82 11/16/2020   BUN 13 11/16/2020   CO2 30 11/16/2020   TSH 2.36 10/16/2020   PSA 0.70 10/16/2020   HGBA1C 9.1 (H) 10/16/2020   MICROALBUR 96.7 (H) 10/16/2020    VAS US RENAL ARTERY DUPLEX  Result Date: 12/23/2020 ABDOMINAL VISCERAL Patient Name:  SYLVAN SOOKDEO  Date of Exam:   12/22/2020 Medical Rec #: [id number]       Accession #:    [id number] Date of Birth: 11-14-1965       Patient Gender: M Patient Age:   72Y Exam Location:  [facility] Procedure:      VAS US RENAL ARTERY DUPLEX Referring Phys: [id number] Slade [place] -------------------------------------------------------------------------------- Indications: Uncontrolled HTN; on several medications to control BP; denies              abdominal pain. High Risk Factors: Hypertension, hyperlipidemia, Diabetes, no history of                    smoking. Limitations: Air/bowel gas and obesity. Performing Technologist: Wilkie Aye RVT  Examination Guidelines: A complete evaluation includes B-mode imaging, spectral Doppler, color Doppler, and power Doppler as needed of all accessible portions of each vessel. Bilateral testing is considered an integral part of a complete examination. Limited examinations for reoccurring indications may be performed as noted.  Duplex Findings: +--------------------+--------+--------+------+--------+ Mesenteric          PSV cm/sEDV cm/sPlaqueComments +--------------------+--------+--------+------+--------+ Aorta Prox             99      7                   +--------------------+--------+--------+------+--------+ Aorta Distal           105      2                   +--------------------+--------+--------+------+--------+ Celiac Artery Origin  113      24                  +--------------------+--------+--------+------+--------+ SMA Origin            152                          +--------------------+--------+--------+------+--------+  +------------------+--------+--------+-------+ Right Renal ArteryPSV cm/sEDV cm/sComment +------------------+--------+--------+-------+ Origin               61      21           +------------------+--------+--------+-------+ Proximal             53      12           +------------------+--------+--------+-------+ Mid                 134      29           +------------------+--------+--------+-------+ Distal              121      30           +------------------+--------+--------+-------+ +-----------------+--------+--------+-------+ Left Renal ArteryPSV cm/sEDV cm/sComment +-----------------+--------+--------+-------+ Origin              62      16           +-----------------+--------+--------+-------+  Proximal           123      22           +-----------------+--------+--------+-------+ Mid                142      34           +-----------------+--------+--------+-------+ Distal             103      30           +-----------------+--------+--------+-------+ +------------+--------+--------+----+-----------+--------+--------+----+ Right KidneyPSV cm/sEDV cm/sRI  Left KidneyPSV cm/sEDV cm/sRI   +------------+--------+--------+----+-----------+--------+--------+----+ Upper Pole  46      15      0.67Upper Pole 83      29      0.65 +------------+--------+--------+----+-----------+--------+--------+----+ Mid         30      10      0.66Mid        25      8       0.66 +------------+--------+--------+----+-----------+--------+--------+----+ Lower Pole  39      13      0.68Lower Pole 26      9       0.63  +------------+--------+--------+----+-----------+--------+--------+----+ Hilar       66      20      0.70Hilar      52      11      0.79 +------------+--------+--------+----+-----------+--------+--------+----+ +------------------+-------+------------------+-------+ Right Kidney             Left Kidney               +------------------+-------+------------------+-------+ RAR (manual)      1.4    RAR (manual)      1.4     +------------------+-------+------------------+-------+ Cortex thickness  7.50 mmCorex thickness   9.80 mm +------------------+-------+------------------+-------+ Kidney length (cm)13.90  Kidney length (cm)13.90   +------------------+-------+------------------+-------+  Summary: Renal:  Right: Normal size right kidney. Normal right Resisitive Index.        Normal cortical thickness of right kidney. No evidence of        right renal artery stenosis. RRV flow present. Left:  Normal size of left kidney. Normal left Resistive Index.        Normal cortical thickness of the left kidney. No evidence of        left renal artery stenosis. LRV flow present. Mesenteric: Normal Superior Mesenteric artery and Celiac artery findings.  *See table(s) above for measurements and observations.  Diagnosing physician: Larae Grooms MD  Electronically signed by Larae Grooms MD on 12/23/2020 at 1:13:09 PM.    Final     Assessment & Plan:     Walker Kehr, MD

## 2021-01-08 NOTE — Assessment & Plan Note (Signed)
Given [employer]

## 2021-01-09 ENCOUNTER — Other Ambulatory Visit: Payer: Self-pay | Admitting: Internal Medicine

## 2021-01-10 ENCOUNTER — Other Ambulatory Visit (HOSPITAL_COMMUNITY): Payer: Self-pay

## 2021-01-10 MED ORDER — SILDENAFIL CITRATE 100 MG PO TABS
ORAL_TABLET | Freq: Every day | ORAL | 3 refills | Status: DC | PRN
Start: 2021-01-10 — End: 2021-04-16
  Filled 2021-01-10: qty 6, 30d supply, fill #0
  Filled 2021-03-12: qty 6, 30d supply, fill #1

## 2021-01-26 ENCOUNTER — Other Ambulatory Visit (HOSPITAL_COMMUNITY): Payer: Self-pay

## 2021-01-26 MED ORDER — MOUNJARO 10 MG/0.5ML ~~LOC~~ SOAJ
SUBCUTANEOUS | 2 refills | Status: DC
  Filled 2021-01-26: qty 2, 28d supply, fill #0
  Filled 2021-03-03: qty 2, 28d supply, fill #1
  Filled 2021-04-04: qty 2, 28d supply, fill #2

## 2021-02-02 ENCOUNTER — Other Ambulatory Visit (HOSPITAL_COMMUNITY): Payer: Self-pay

## 2021-02-05 ENCOUNTER — Other Ambulatory Visit: Payer: Self-pay | Admitting: Internal Medicine

## 2021-02-05 MED FILL — Nortriptyline HCl Cap 50 MG: ORAL | 90 days supply | Qty: 180 | Fill #1 | Status: AC

## 2021-02-06 ENCOUNTER — Other Ambulatory Visit (HOSPITAL_COMMUNITY): Payer: Self-pay

## 2021-02-06 MED ORDER — FUROSEMIDE 20 MG PO TABS
ORAL_TABLET | ORAL | 0 refills | Status: DC
  Filled 2021-02-06: qty 180, 90d supply, fill #0

## 2021-02-12 ENCOUNTER — Telehealth: Payer: Self-pay | Admitting: Internal Medicine

## 2021-02-12 NOTE — Telephone Encounter (Signed)
Lucy, Please write Eduard Clos letter that she can return to work after 5 days of isolation.  Wear a mask for another 5 days.  Thanks

## 2021-02-12 NOTE — Telephone Encounter (Signed)
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 Alain Marion can provide him such letter via [place]  Please fu [phone]

## 2021-02-13 NOTE — Telephone Encounter (Signed)
Called pt to see if he was feeling better, and when he was going back to work. Pt states still have lil cough,but will go bck to work on Thursday 9/15. Notified pt to wear mask for 5 days when he return back to work. Generated work note pt will retrieve from [employer].Marland KitchenJohny Byrd

## 2021-02-23 ENCOUNTER — Other Ambulatory Visit (HOSPITAL_COMMUNITY): Payer: Self-pay

## 2021-03-01 ENCOUNTER — Other Ambulatory Visit (HOSPITAL_COMMUNITY): Payer: Self-pay

## 2021-03-05 ENCOUNTER — Other Ambulatory Visit (HOSPITAL_COMMUNITY): Payer: Self-pay

## 2021-03-09 ENCOUNTER — Other Ambulatory Visit (HOSPITAL_COMMUNITY): Payer: Self-pay

## 2021-03-12 ENCOUNTER — Other Ambulatory Visit (HOSPITAL_COMMUNITY): Payer: Self-pay

## 2021-03-16 NOTE — Progress Notes (Signed)
Cardiology Office Note   Date:  03/19/2021   ID:  Victor Byrd, DOB Nov 27, 1965, MRN [id number]  PCP:  Cassandria Anger, MD  Cardiologist:   Kano Heckmann Martin[place], MD   Chief Complaint  Patient presents with   Hypertension       History of Present Illness: Victor Byrd is a 55 y.o. male who is seen for follow up of severe HTN and elevated coronary calcium score.  He has a history of morbid obesity with OSA, HTN, HLD and DM type 2. I saw him remotely in 2013 for atypical chest pain. Stress Echo at that time was normal. He did have a recent coronary calcium score of 161 placing him at 88th percentile for age and sex. He did have Renal duplex in July that was negative for RAS.   Since his last visit he has made changes in his eating habits. Eating less fast food and healthier snacks like an apple or protein bar. He has lost 11 lbs. Sugars are better. Notes BP readings from 673-419 systolic. Tolerating medication well. Is planning to be evaluated by [facility] for bariatric surgery.      Past Medical History:  Diagnosis Date   Anxiety    Asthma    Depression    Diabetes mellitus type II    Hyperlipidemia    Hypertension    Hyperthyroidism    Pt denies thyroid issues   OSA on CPAP    Dr. Chalmers Cater   Restless legs    Sleep apnea    cpap    Past Surgical History:  Procedure Laterality Date   COLONOSCOPY  11/01/2020   Heath Cellar, MD   [place]  2007   WISDOM TOOTH EXTRACTION  1992     Current Outpatient Medications  Medication Sig Dispense Refill   albuterol (VENTOLIN HFA) 108 (90 Base) MCG/ACT inhaler INHALE 2 PUFFS INTO THE LUNGS EVERY 6 HOURS AS NEEDED FOR WHEEZING. 18 g 1   amLODipine (NORVASC) 10 MG tablet Take 1 tablet (10 mg total) by mouth daily. 30 tablet 6   aspirin 81 MG tablet Take 81 mg by mouth daily.     Blood Glucose Monitoring Suppl (FREESTYLE FREEDOM LITE) w/Device KIT Use as directed 1 kit 0    buPROPion (WELLBUTRIN XL) 150 MG 24 hr tablet TAKE 1 TABLET BY MOUTH ONCE A DAY (NEEDS OFFICE VISIT) 90 tablet 3   chlorhexidine (PERIDEX) 0.12 % solution Use 50ms to swish in the mouth for 30 seconds then spit out 2 times per day after brushing teeth, 473 mL 3   citalopram (CELEXA) 20 MG tablet Take 1 tablet (20 mg total) by mouth daily. 90 tablet 3   cloNIDine (CATAPRES) 0.2 MG tablet Take 1 tablet (0.2 mg total) by mouth 2 (two) times daily. 60 tablet 11   doxazosin (CARDURA) 8 MG tablet Take 1/2 to 1 tablet (4-8 mg total) by mouth daily. 90 tablet 3   Dulaglutide (TRULICITY) 3 MFX/9.0WISOPN Inject 341msubcutaneous once a week 2 mL 6   furosemide (LASIX) 20 MG tablet Take 1 to 2 tablets by mouth once a day as needed 180 tablet 0   glimepiride (AMARYL) 4 MG tablet Take 1 tablet (4 mg total) by mouth 2 (two) times daily. 180 tablet 4   glucose blood (ACCU-CHEK GUIDE) test strip Use as directed (Patient taking differently: Use as directed) 100 each 10   glucose blood test strip  Use as directed (Patient taking differently: Use as directed) 100 each 0   Insulin Glargine-yfgn (SEMGLEE, YFGN,) 100 UNIT/ML SOPN Inject 75 units subcutaneous twice a day (Patient taking differently: 50 Units.) 135 mL 4   Insulin Pen Needle 31G X 5 MM MISC USE AS DIRECTED SUBCUTANEOUSLY 2 TIMES DAILY 200 each 6   Lancets (FREESTYLE) lancets Use as directed (Patient taking differently: Use as directed) 100 each 0   metFORMIN (GLUCOPHAGE) 1000 MG tablet TAKE 1 TABLET BY MOUTH 2 TIMES DAILY 180 tablet 3   Multiple Vitamin (MULTIVITAMIN WITH MINERALS) TABS tablet Take 1 tablet by mouth daily.     nortriptyline (PAMELOR) 50 MG capsule TAKE 1 OR 2 CAPSULES BY MOUTH AT BEDTIME 180 capsule 3   Omega-3 Fatty Acids (FISH OIL) 1000 MG CAPS 1 capsule     sildenafil (VIAGRA) 100 MG tablet TAKE 1 TABLET BY MOUTH ONCE DAILY AS NEEDED FOR ERECTILE DYSFUNCTION 12 tablet 3   telmisartan-hydrochlorothiazide (MICARDIS HCT) 80-25 MG tablet  Take 1 tablet by mouth daily. 90 tablet 3   tirzepatide (MOUNJARO) 10 MG/0.5ML Pen Inject 41m (0.5 ml) into the skin once a week 2 mL 2   atorvastatin (LIPITOR) 40 MG tablet TAKE 1 TABLET BY MOUTH ONCE DAILY 60 tablet 11   glimepiride (AMARYL) 4 MG tablet TAKE 1 TABLET BY MOUTH TWO TIMES DAILY 180 tablet 3   No current facility-administered medications for this visit.    Allergies:   Dapagliflozin and Ramipril    Social History:  The patient  reports that he has never smoked. He has never used smokeless tobacco. He reports current alcohol use. He reports that he does not use drugs.   Family History:  The patient's family history includes Colon polyps in his father; Diabetes in his mother and another family member; Heart attack in his father; Heart disease in his father; Hyperlipidemia in an other family member; Hypertension in his father and another family member; Lung cancer in an other family member.    ROS:  Please see the history of present illness.   Otherwise, review of systems are positive for none.   All other systems are reviewed and negative.    PHYSICAL EXAM: VS:  BP (!) 150/82 (BP Location: Left Arm)   Pulse 64   Ht 6' (1.829 m)   Wt 295 lb 3.2 oz (133.9 kg)   SpO2 97%   BMI 40.04 kg/m  , BMI Body mass index is 40.04 kg/m. GEN: Morbidly obese, in no acute distress HEENT: normal Neck: no JVD, carotid bruits, or masses Cardiac: RRR; no murmurs, rubs, or gallops,no edema  Respiratory:  clear to auscultation bilaterally, normal work of breathing GI: soft, nontender, nondistended, + BS MS: no deformity or atrophy Skin: warm and dry, no rash Neuro:  Strength and sensation are intact Psych: euthymic mood, full affect   EKG:  EKG is not ordered today.   Recent Labs: 10/16/2020: ALT 42; TSH 2.36 11/16/2020: BUN 13; Creatinine, Ser 0.82; Hemoglobin 13.0; Platelets 305; Potassium 3.3; Sodium 137    Lipid Panel    Component Value Date/Time   CHOL 131 10/16/2020 1449    TRIG 133.0 10/16/2020 1449   HDL 44.70 10/16/2020 1449   CHOLHDL 3 10/16/2020 1449   VLDL 26.6 10/16/2020 1449   LDLCALC 59 10/16/2020 1449      Wt Readings from Last 3 Encounters:  03/19/21 295 lb 3.2 oz (133.9 kg)  01/08/21 (!) 306 lb 6.4 oz (139 kg)  11/27/20 (!) 303  lb 9.6 oz (137.7 kg)      Other studies Reviewed: Additional studies/ records that were reviewed today include:   Stress Echo 06/21/11: Stress Echocardiography   Patient:    Alfonza, Toft  MR #:       [id number]  Study Date: 06/21/2011  Gender:     M  Age:        34  Height:     182.9cm  Weight:     128.6kg  BSA:        2.21m2  Pt. Status:  Room:      ORDERING     Trichelle Lehan JMartinique MD   REFERRING    Tamario Heal JMartinique MD   ATTENDING    Nahser, PHarts Site [facility]   SONOGRAPHER  SBarbra Sarks RDCS   REFERRING    Plotnikov, Alex  cc:   ------------------------------------------------------------   ------------------------------------------------------------  Indications:      Chest pain 786.51.   ------------------------------------------------------------  History:   PMH:   Chest pain.  Risk factors:  Sleep apnea.  Hypertension. Diabetes mellitus. Morbidly obese.  Dyslipidemia.                Bruce protocol. Stress  echocardiography.  Height:  Height: 182.9cm. Height: 72in.  Weight:  Weight: 128.6kg. Weight: 283lb.  Body mass index:  BMI: 38.5kg/m^2.  Body surface area:    BSA: 2.640m.  Blood  pressure:     141/70.  Patient status:  Outpatient.   ------------------------------------------------------------   ------------------------------------------------------------  Stress protocol:   +--------------------+---+------------+--------------------+  Stage               HR BP (mmHg)   Symptoms              +--------------------+---+------------+--------------------+  Baseline            77 141/70 (94) None                   +--------------------+---+------------+--------------------+  Stage 1             122164/51 (89) None                  +--------------------+---+------------+--------------------+  Stage 2             133203/80 (121)Dyspnea, Legs                                            burning               +--------------------+---+------------+--------------------+  Stage 3             155--------------------------------  +--------------------+---+------------+--------------------+  Immediate post      150209/70 (116)Legs cramping         stress                                                   +--------------------+---+------------+--------------------+  Recovery; 1 min     130203/85 (124)Subsiding             +--------------------+---+------------+--------------------+  Recovery; 2 min     111192/69 (110)Subsiding             +--------------------+---+------------+--------------------+  Recovery; 3 min     105172/56 (  95) Subsiding             +--------------------+---+------------+--------------------+  Recovery; 4 min     100161/48 (86) None                  +--------------------+---+------------+--------------------+  Recovery; 5 min     100--------------------------------  +--------------------+---+------------+--------------------+  Late recovery; 6 min100140/65 (90) None                  +--------------------+---+------------+--------------------+   ------------------------------------------------------------  Stress results:   Maximal heart rate during stress was  155bpm (87% of maximal predicted heart rate). The maximal  predicted heart rate was 177bpm.The target heart rate was  achieved. The heart rate response to stress was normal.  There was a normal resting blood pressure with a  hypertensive response to stress. The rate-pressure product  for the peak heart rate and blood pressure was 31330m  Hg/min.  The patient  experienced no chest pain during  stress.   ------------------------------------------------------------  Stress ECG:  There are no ST or T wave changes to suggest  ischemia.   ------------------------------------------------------------  Baseline:   - LV global systolic function was normal.  - Normal wall motion; no LV regional wall motion    abnormalities.  Peak stress:   - LV global systolic function was vigorous.  - No evidence for new LV regional wall motion abnormalities.   ------------------------------------------------------------  Stress echo results:     This is intrepreted as a normal  stress echocardiogram. There is no evidence of ischemia.  Normal LV function.   ------------------------------------------------------------  Prepared and Electronically Authenticated by   NMertie Moores 2013-01-18T15:19:20.230  ADDENDUM REPORT: 10/27/2020 14:47   CLINICAL DATA:  79M with hypertension and obesity for cardiovascular disease risk stratification   EXAM: Coronary Calcium Score   TECHNIQUE: A gated, non-contrast computed tomography scan of the heart was performed using 318mslice thickness. Axial images were analyzed on a dedicated workstation. Calcium scoring of the coronary arteries was performed using the Agatston method.   FINDINGS: Coronary arteries: Normal origins.   Coronary Calcium Score:   Left main: 0   Left anterior descending artery: 49.8   Left circumflex artery: 103   Right coronary artery: 7.82   Total: 161   Percentile: 88th   Pericardium: Normal.   Ascending Aorta: Normal caliber.   [facility]-cardiac: [facility]   IMPRESSION: Coronary calcium score of 161. This was 88th percentile for age-, race-, and sex-matched controls.   RECOMMENDATIONS: Coronary artery calcium (CAC) score is a strong predictor of incident coronary heart disease (CHD) and provides predictive information beyond traditional  risk factors. CAC scoring is reasonable to use in the decision to withhold, postpone, or initiate statin therapy in intermediate-risk or selected borderline-risk asymptomatic adults (age 553-75ears and LDL-C >=70 to <190 mg/dL) who do not have diabetes or established atherosclerotic cardiovascular disease (ASCVD).* In intermediate-risk (10-year ASCVD risk >=7.5% to <20%) adults or selected borderline-risk (10-year ASCVD risk >=5% to <7.5%) adults in whom a CAC score is measured for the purpose of making a treatment decision the following recommendations have been made:   If CAC=0, it is reasonable to withhold statin therapy and reassess in 5 to 10 years, as long as higher risk conditions are absent (diabetes mellitus, family history of premature CHD in first degree relatives (males <55 years; females <65 years), cigarette smoking, or LDL >=190 mg/dL).   If CAC is 1 to 99, it is reasonable to initiate statin therapy  for patients >=3 years of age.   If CAC is >=100 or >=75th percentile, it is reasonable to initiate statin therapy at any age.   Cardiology referral should be considered for patients with CAC scores >=400 or >=75th percentile.   *2018 AHA/ACC/AACVPR/AAPA/ABC/ACPM/ADA/AGS/APhA/ASPC/NLA/PCNA Guideline on the Management of Blood Cholesterol: A Report of the American College of Cardiology/American Heart Association Task Force on Clinical Practice Guidelines. J Am Coll Cardiol. 2019;73(24):3168-3209.   Skeet Latch, MD     Electronically Signed   By: Skeet Latch   On: 10/27/2020 14:47     ASSESSMENT AND PLAN:  1.  Poorly controlled HTN. On multiple medications. No evidence of RAS. Improved control with dietary changes, weight loss and medication compliance. Continue low sodium diet. Now on Clonidine, amlodipine, Cardura, and Telmisartan HCT.  2. HLD well controlled on atorvastatin 3. DM follow up with Dr Chalmers Cater. 4. Coronary artery calcification.  Currently asymptomatic. Discussed that this is a marker of increased CV risk. At this point need to focus on aggressive risk factor modification. Discussed that if he develops any chest pain, increased dyspnea or fatigue he needs to let me know. 5. Aortic atherosclerosis. Also focus on risk factor modification 6. Morbid obesity with OSA. On CPAP.    Current medicines are reviewed at length with the patient today.  The patient does not have concerns regarding medicines.  The following changes have been made:  no change  Labs/ tests ordered today include:   No orders of the defined types were placed in this encounter.     Disposition:   FU with APP in 6 months  Signed, Brylee Mcgreal Martinique, MD  03/19/2021 12:18 PM    [facility] [address], [place], [place], [address] Phone [phone], Fax [phone]

## 2021-03-17 MED FILL — Metformin HCl Tab 1000 MG: ORAL | 90 days supply | Qty: 180 | Fill #1 | Status: AC

## 2021-03-19 ENCOUNTER — Other Ambulatory Visit: Payer: Self-pay | Admitting: Internal Medicine

## 2021-03-19 ENCOUNTER — Other Ambulatory Visit: Payer: Self-pay

## 2021-03-19 ENCOUNTER — Encounter: Payer: Self-pay | Admitting: Cardiology

## 2021-03-19 ENCOUNTER — Ambulatory Visit (INDEPENDENT_AMBULATORY_CARE_PROVIDER_SITE_OTHER): Payer: No Typology Code available for payment source | Admitting: Cardiology

## 2021-03-19 ENCOUNTER — Other Ambulatory Visit (HOSPITAL_COMMUNITY): Payer: Self-pay

## 2021-03-19 VITALS — BP 150/82 | HR 64 | Ht 72.0 in | Wt 295.2 lb

## 2021-03-19 DIAGNOSIS — Z6841 Body Mass Index (BMI) 40.0 and over, adult: Secondary | ICD-10-CM

## 2021-03-19 DIAGNOSIS — E7800 Pure hypercholesterolemia, unspecified: Secondary | ICD-10-CM | POA: Diagnosis not present

## 2021-03-19 DIAGNOSIS — G4733 Obstructive sleep apnea (adult) (pediatric): Secondary | ICD-10-CM

## 2021-03-19 DIAGNOSIS — I2510 Atherosclerotic heart disease of native coronary artery without angina pectoris: Secondary | ICD-10-CM | POA: Diagnosis not present

## 2021-03-19 DIAGNOSIS — I10 Essential (primary) hypertension: Secondary | ICD-10-CM | POA: Diagnosis not present

## 2021-03-19 DIAGNOSIS — I2584 Coronary atherosclerosis due to calcified coronary lesion: Secondary | ICD-10-CM

## 2021-03-19 MED ORDER — FUROSEMIDE 20 MG PO TABS
ORAL_TABLET | ORAL | 0 refills | Status: DC
  Filled 2021-03-19: qty 180, fill #0
  Filled 2021-05-12: qty 180, 90d supply, fill #0

## 2021-03-22 NOTE — Progress Notes (Deleted)
[place] Phone: [phone] Subjective:    I'm seeing this patient by the request  of:  Plotnikov, Evie Lacks, MD  CC:   [id number]  Victor Byrd is a 55 y.o. male coming in with complaint of knees, hips, and ankles. Patient states    Onset-  Location Duration-  Character- Aggravating factors- Reliving factors-  Therapies tried-  Severity-     Past Medical History:  Diagnosis Date   Anxiety    Asthma    Depression    Diabetes mellitus type II    Hyperlipidemia    Hypertension    Hyperthyroidism    Pt denies thyroid issues   OSA on CPAP    Dr. Chalmers Cater   Restless legs    Sleep apnea    cpap   Past Surgical History:  Procedure Laterality Date   COLONOSCOPY  11/01/2020   McKean Cellar, MD   [place]  2007   WISDOM TOOTH EXTRACTION  1992   Social History   Socioeconomic History   Marital status: Married    Spouse name: Not on file   Number of children: 2   Years of education: Not on file   Highest education level: Not on file  Occupational History   Occupation: Public house manager: UNEMPLOYED    Comment: out of work  Tobacco Use   Smoking status: Never   Smokeless tobacco: Never  Vaping Use   Vaping Use: Never used  Substance and Sexual Activity   Alcohol use: Yes    Alcohol/week: 0.0 standard drinks   Drug use: No   Sexual activity: Yes  Other Topics Concern   Not on file  Social History Narrative   Not on file   Social Determinants of Health   Financial Resource Strain: Not on file  Food Insecurity: Not on file  Transportation Needs: Not on file  Physical Activity: Not on file  Stress: Not on file  Social Connections: Not on file   Allergies  Allergen Reactions   Dapagliflozin Other (See Comments)   Ramipril     cough   Family History  Problem Relation Age of Onset   Diabetes Mother    Heart disease Father    Heart  attack Father    Hypertension Father    Colon polyps Father    Diabetes Other    Hyperlipidemia Other    Lung cancer Other    Hypertension Other    Colon cancer Neg Hx    Esophageal cancer Neg Hx    Rectal cancer Neg Hx    Stomach cancer Neg Hx     Current Outpatient Medications (Endocrine & Metabolic):    Dulaglutide (TRULICITY) 3 TD/1.7OH SOPN, Inject 70m subcutaneous once a week   glimepiride (AMARYL) 4 MG tablet, TAKE 1 TABLET BY MOUTH TWO TIMES DAILY   glimepiride (AMARYL) 4 MG tablet, Take 1 tablet (4 mg total) by mouth 2 (two) times daily.   Insulin Glargine-yfgn (SEMGLEE, YFGN,) 100 UNIT/ML SOPN, Inject 75 units subcutaneous twice a day (Patient taking differently: 50 Units.)   metFORMIN (GLUCOPHAGE) 1000 MG tablet, TAKE 1 TABLET BY MOUTH 2 TIMES DAILY   tirzepatide (MOUNJARO) 10 MG/0.5ML Pen, Inject 175m(0.5 ml) into the skin once a week  Current Outpatient Medications (Cardiovascular):    amLODipine (NORVASC) 10 MG tablet, Take 1 tablet (10 mg total) by mouth daily.   atorvastatin (  LIPITOR) 40 MG tablet, TAKE 1 TABLET BY MOUTH ONCE DAILY   cloNIDine (CATAPRES) 0.2 MG tablet, Take 1 tablet (0.2 mg total) by mouth 2 (two) times daily.   doxazosin (CARDURA) 8 MG tablet, Take 1/2 to 1 tablet (4-8 mg total) by mouth daily.   furosemide (LASIX) 20 MG tablet, Take 1 to 2 tablets by mouth once a day as needed   sildenafil (VIAGRA) 100 MG tablet, TAKE 1 TABLET BY MOUTH ONCE DAILY AS NEEDED FOR ERECTILE DYSFUNCTION   telmisartan-hydrochlorothiazide (MICARDIS HCT) 80-25 MG tablet, Take 1 tablet by mouth daily.  Current Outpatient Medications (Respiratory):    albuterol (VENTOLIN HFA) 108 (90 Base) MCG/ACT inhaler, INHALE 2 PUFFS INTO THE LUNGS EVERY 6 HOURS AS NEEDED FOR WHEEZING.  Current Outpatient Medications (Analgesics):    aspirin 81 MG tablet, Take 81 mg by mouth daily.   Current Outpatient Medications (Other):    Blood Glucose Monitoring Suppl (FREESTYLE FREEDOM LITE)  w/Device KIT, Use as directed   buPROPion (WELLBUTRIN XL) 150 MG 24 hr tablet, TAKE 1 TABLET BY MOUTH ONCE A DAY (NEEDS OFFICE VISIT)   chlorhexidine (PERIDEX) 0.12 % solution, Use 69ms to swish in the mouth for 30 seconds then spit out 2 times per day after brushing teeth,   citalopram (CELEXA) 20 MG tablet, Take 1 tablet (20 mg total) by mouth daily.   glucose blood (ACCU-CHEK GUIDE) test strip, Use as directed (Patient taking differently: Use as directed)   glucose blood test strip, Use as directed (Patient taking differently: Use as directed)   Insulin Pen Needle 31G X 5 MM MISC, USE AS DIRECTED SUBCUTANEOUSLY 2 TIMES DAILY   Lancets (FREESTYLE) lancets, Use as directed (Patient taking differently: Use as directed)   Multiple Vitamin (MULTIVITAMIN WITH MINERALS) TABS tablet, Take 1 tablet by mouth daily.   nortriptyline (PAMELOR) 50 MG capsule, TAKE 1 OR 2 CAPSULES BY MOUTH AT BEDTIME   Omega-3 Fatty Acids (FISH OIL) 1000 MG CAPS, 1 capsule   Reviewed prior external information including notes and imaging from  primary care provider As well as notes that were available from care everywhere and other healthcare systems.  Past medical history, social, surgical and family history all reviewed in electronic medical record.  No pertanent information unless stated regarding to the chief complaint.   Review of Systems:  No headache, visual changes, nausea, vomiting, diarrhea, constipation, dizziness, abdominal pain, skin rash, fevers, chills, night sweats, weight loss, swollen lymph nodes, body aches, joint swelling, chest pain, shortness of breath, mood changes. POSITIVE muscle aches  Objective  There were no vitals taken for this visit.   General: No apparent distress alert and oriented x3 mood and affect normal, dressed appropriately.  HEENT: Pupils equal, extraocular movements intact  Respiratory: Patient's speak in full sentences and does not appear short of breath  Cardiovascular: No  lower extremity edema, non tender, no erythema  Gait normal with good balance and coordination.  MSK:  Non tender with full range of motion and good stability and symmetric strength and tone of shoulders, elbows, wrist, hip, knee and ankles bilaterally.     Impression and Recommendations:     The above documentation has been reviewed and is accurate and complete VJacqualin Combes

## 2021-03-23 ENCOUNTER — Ambulatory Visit: Payer: No Typology Code available for payment source | Admitting: Family Medicine

## 2021-03-26 ENCOUNTER — Other Ambulatory Visit (HOSPITAL_COMMUNITY): Payer: Self-pay

## 2021-03-29 ENCOUNTER — Other Ambulatory Visit (HOSPITAL_COMMUNITY): Payer: Self-pay

## 2021-04-02 ENCOUNTER — Other Ambulatory Visit (HOSPITAL_COMMUNITY): Payer: Self-pay

## 2021-04-04 ENCOUNTER — Other Ambulatory Visit (HOSPITAL_COMMUNITY): Payer: Self-pay

## 2021-04-06 ENCOUNTER — Other Ambulatory Visit (HOSPITAL_COMMUNITY): Payer: Self-pay

## 2021-04-16 ENCOUNTER — Other Ambulatory Visit (HOSPITAL_COMMUNITY): Payer: Self-pay

## 2021-04-16 ENCOUNTER — Other Ambulatory Visit: Payer: Self-pay

## 2021-04-16 ENCOUNTER — Encounter: Payer: Self-pay | Admitting: Internal Medicine

## 2021-04-16 ENCOUNTER — Ambulatory Visit (INDEPENDENT_AMBULATORY_CARE_PROVIDER_SITE_OTHER): Payer: No Typology Code available for payment source | Admitting: Internal Medicine

## 2021-04-16 DIAGNOSIS — F339 Major depressive disorder, recurrent, unspecified: Secondary | ICD-10-CM | POA: Diagnosis not present

## 2021-04-16 DIAGNOSIS — E1169 Type 2 diabetes mellitus with other specified complication: Secondary | ICD-10-CM | POA: Diagnosis not present

## 2021-04-16 DIAGNOSIS — I10 Essential (primary) hypertension: Secondary | ICD-10-CM

## 2021-04-16 DIAGNOSIS — E669 Obesity, unspecified: Secondary | ICD-10-CM

## 2021-04-16 DIAGNOSIS — N522 Drug-induced erectile dysfunction: Secondary | ICD-10-CM

## 2021-04-16 DIAGNOSIS — Z6841 Body Mass Index (BMI) 40.0 and over, adult: Secondary | ICD-10-CM

## 2021-04-16 MED ORDER — BUPROPION HCL ER (XL) 300 MG PO TB24
300.0000 mg | ORAL_TABLET | Freq: Every day | ORAL | 3 refills | Status: DC
  Filled 2021-04-16: qty 90, 90d supply, fill #0
  Filled 2021-08-09: qty 30, 30d supply, fill #1
  Filled 2021-09-10: qty 30, 30d supply, fill #2
  Filled 2021-10-11: qty 30, 30d supply, fill #3
  Filled 2021-11-09: qty 30, 30d supply, fill #4
  Filled 2021-12-21: qty 90, 90d supply, fill #5

## 2021-04-16 MED ORDER — SILDENAFIL CITRATE 100 MG PO TABS
ORAL_TABLET | Freq: Every day | ORAL | 5 refills | Status: DC | PRN
  Filled 2021-04-16: qty 12, 30d supply, fill #0

## 2021-04-16 NOTE — Assessment & Plan Note (Signed)
On Mounjaro now

## 2021-04-16 NOTE — Assessment & Plan Note (Signed)
Worse Increase Wellbutrin XL to 300 mg/d

## 2021-04-16 NOTE — Assessment & Plan Note (Signed)
BP is better at home. No BP spikes

## 2021-04-16 NOTE — Assessment & Plan Note (Signed)
Better on Mounjaro 

## 2021-04-16 NOTE — Progress Notes (Signed)
Subjective:  Patient ID: Victor Byrd, male    DOB: [birth year]  Age: 55 y.o. MRN: [id number]  CC: Follow-up (3 month f/u)   HPI Victor Byrd presents for HTN, obesity, depression, DM, ED SBP 130s at home  Outpatient Medications Prior to Visit  Medication Sig Dispense Refill   albuterol (VENTOLIN HFA) 108 (90 Base) MCG/ACT inhaler INHALE 2 PUFFS INTO THE LUNGS EVERY 6 HOURS AS NEEDED FOR WHEEZING. 18 g 1   amLODipine (NORVASC) 10 MG tablet Take 1 tablet (10 mg total) by mouth daily. 30 tablet 6   aspirin 81 MG tablet Take 81 mg by mouth daily.     Blood Glucose Monitoring Suppl (FREESTYLE FREEDOM LITE) w/Device KIT Use as directed 1 kit 0   chlorhexidine (PERIDEX) 0.12 % solution Use 28ms to swish in the mouth for 30 seconds then spit out 2 times per day after brushing teeth, 473 mL 3   citalopram (CELEXA) 20 MG tablet Take 1 tablet (20 mg total) by mouth daily. 90 tablet 3   cloNIDine (CATAPRES) 0.2 MG tablet Take 1 tablet (0.2 mg total) by mouth 2 (two) times daily. 60 tablet 11   doxazosin (CARDURA) 8 MG tablet Take 1/2 to 1 tablet (4-8 mg total) by mouth daily. 90 tablet 3   Dulaglutide (TRULICITY) 3 MCM/0.3KJSOPN Inject 363msubcutaneous once a week 2 mL 6   furosemide (LASIX) 20 MG tablet Take 1 to 2 tablets by mouth once a day as needed 180 tablet 0   glimepiride (AMARYL) 4 MG tablet Take 1 tablet (4 mg total) by mouth 2 (two) times daily. 180 tablet 4   glucose blood (ACCU-CHEK GUIDE) test strip Use as directed (Patient taking differently: Use as directed) 100 each 10   glucose blood test strip Use as directed (Patient taking differently: Use as directed) 100 each 0   Insulin Glargine-yfgn (SEMGLEE, YFGN,) 100 UNIT/ML SOPN Inject 75 units subcutaneous twice a day (Patient taking differently: 45 Units.) 135 mL 4   Insulin Pen Needle 31G X 5 MM MISC USE AS DIRECTED SUBCUTANEOUSLY 2 TIMES DAILY 200 each 6   Lancets (FREESTYLE) lancets Use as directed (Patient taking differently:  Use as directed) 100 each 0   metFORMIN (GLUCOPHAGE) 1000 MG tablet TAKE 1 TABLET BY MOUTH 2 TIMES DAILY 180 tablet 3   Multiple Vitamin (MULTIVITAMIN WITH MINERALS) TABS tablet Take 1 tablet by mouth daily.     nortriptyline (PAMELOR) 50 MG capsule TAKE 1 OR 2 CAPSULES BY MOUTH AT BEDTIME 180 capsule 3   Omega-3 Fatty Acids (FISH OIL) 1000 MG CAPS 1 capsule     telmisartan-hydrochlorothiazide (MICARDIS HCT) 80-25 MG tablet Take 1 tablet by mouth daily. 90 tablet 3   tirzepatide (MOUNJARO) 10 MG/0.5ML Pen Inject 1015m0.5 ml) into the skin once a week 2 mL 2   buPROPion (WELLBUTRIN XL) 150 MG 24 hr tablet TAKE 1 TABLET BY MOUTH ONCE A DAY (NEEDS OFFICE VISIT) 90 tablet 3   sildenafil (VIAGRA) 100 MG tablet TAKE 1 TABLET BY MOUTH ONCE DAILY AS NEEDED FOR ERECTILE DYSFUNCTION 12 tablet 3   atorvastatin (LIPITOR) 40 MG tablet TAKE 1 TABLET BY MOUTH ONCE DAILY 60 tablet 11   glimepiride (AMARYL) 4 MG tablet TAKE 1 TABLET BY MOUTH TWO TIMES DAILY 180 tablet 3   No facility-administered medications prior to visit.    ROS: Review of Systems  Constitutional:  Negative for appetite change, fatigue and unexpected weight change.  HENT:  Negative for  congestion, nosebleeds, sneezing, sore throat and trouble swallowing.   Eyes:  Negative for itching and visual disturbance.  Respiratory:  Negative for cough.   Cardiovascular:  Negative for chest pain, palpitations and leg swelling.  Gastrointestinal:  Negative for abdominal distention, blood in stool, diarrhea and nausea.  Genitourinary:  Negative for frequency and hematuria.  Musculoskeletal:  Negative for back pain, gait problem, joint swelling and neck pain.  Skin:  Negative for rash.  Neurological:  Negative for dizziness, tremors, speech difficulty and weakness.  Psychiatric/Behavioral:  Positive for dysphoric mood. Negative for agitation, sleep disturbance and suicidal ideas. The patient is nervous/anxious.    Objective:  BP (!) 142/62 (BP  Location: Left Arm)   Pulse 87   Temp 98 F (36.7 C) (Oral)   Ht 6' (1.829 m)   Wt 298 lb 12.8 oz (135.5 kg)   SpO2 97%   BMI 40.52 kg/m   BP Readings from Last 3 Encounters:  04/16/21 (!) 142/62  03/19/21 (!) 150/82  01/08/21 (!) 162/80    Wt Readings from Last 3 Encounters:  04/16/21 298 lb 12.8 oz (135.5 kg)  03/19/21 295 lb 3.2 oz (133.9 kg)  01/08/21 (!) 306 lb 6.4 oz (139 kg)    Physical Exam Constitutional:      General: He is not in acute distress.    Appearance: He is well-developed. He is obese.     Comments: NAD  Eyes:     Conjunctiva/sclera: Conjunctivae normal.     Pupils: Pupils are equal, round, and reactive to light.  Neck:     Thyroid: No thyromegaly.     Vascular: No JVD.  Cardiovascular:     Rate and Rhythm: Normal rate and regular rhythm.     Heart sounds: Normal heart sounds. No murmur heard.   No friction rub. No gallop.  Pulmonary:     Effort: Pulmonary effort is normal. No respiratory distress.     Breath sounds: Normal breath sounds. No wheezing or rales.  Chest:     Chest wall: No tenderness.  Abdominal:     General: Bowel sounds are normal. There is no distension.     Palpations: Abdomen is soft. There is no mass.     Tenderness: There is no abdominal tenderness. There is no guarding or rebound.  Musculoskeletal:        General: No tenderness. Normal range of motion.     Cervical back: Normal range of motion.  Lymphadenopathy:     Cervical: No cervical adenopathy.  Skin:    General: Skin is warm and dry.     Findings: No rash.  Neurological:     Mental Status: He is alert and oriented to person, place, and time.     Cranial Nerves: No cranial nerve deficit.     Motor: No abnormal muscle tone.     Coordination: Coordination normal.     Gait: Gait normal.     Deep Tendon Reflexes: Reflexes are normal and symmetric.  Psychiatric:        Behavior: Behavior normal.        Thought Content: Thought content normal.        Judgment:  Judgment normal.    Lab Results  Component Value Date   WBC 9.5 11/16/2020   HGB 13.0 11/16/2020   HCT 39.5 11/16/2020   PLT 305 11/16/2020   GLUCOSE 88 11/16/2020   CHOL 131 10/16/2020   TRIG 133.0 10/16/2020   HDL 44.70 10/16/2020   LDLCALC 59  10/16/2020   ALT 42 10/16/2020   AST 25 10/16/2020   NA 137 11/16/2020   K 3.3 (L) 11/16/2020   CL 99 11/16/2020   CREATININE 0.82 11/16/2020   BUN 13 11/16/2020   CO2 30 11/16/2020   TSH 2.36 10/16/2020   PSA 0.70 10/16/2020   HGBA1C 9.1 (H) 10/16/2020   MICROALBUR 96.7 (H) 10/16/2020    VAS US RENAL ARTERY DUPLEX  Result Date: 12/23/2020 ABDOMINAL VISCERAL Patient Name:  SUKHRAJ ESQUIVIAS  Date of Exam:   12/22/2020 Medical Rec #: [id number]       Accession #:    [id number] Date of Birth: January 26, 1966       Patient Gender: M Patient Age:   40Y Exam Location:  [facility] Procedure:      VAS US RENAL ARTERY DUPLEX Referring Phys: [id number] Ander Slade [place] -------------------------------------------------------------------------------- Indications: Uncontrolled HTN; on several medications to control BP; denies              abdominal pain. High Risk Factors: Hypertension, hyperlipidemia, Diabetes, no history of                    smoking. Limitations: Air/bowel gas and obesity. Performing Technologist: Wilkie Aye RVT  Examination Guidelines: A complete evaluation includes B-mode imaging, spectral Doppler, color Doppler, and power Doppler as needed of all accessible portions of each vessel. Bilateral testing is considered an integral part of a complete examination. Limited examinations for reoccurring indications may be performed as noted.  Duplex Findings: +--------------------+--------+--------+------+--------+ Mesenteric          PSV cm/sEDV cm/sPlaqueComments +--------------------+--------+--------+------+--------+ Aorta Prox             99      7                   +--------------------+--------+--------+------+--------+ Aorta Distal           105      2                   +--------------------+--------+--------+------+--------+ Celiac Artery Origin  113      24                  +--------------------+--------+--------+------+--------+ SMA Origin            152                          +--------------------+--------+--------+------+--------+  +------------------+--------+--------+-------+ Right Renal ArteryPSV cm/sEDV cm/sComment +------------------+--------+--------+-------+ Origin               61      21           +------------------+--------+--------+-------+ Proximal             53      12           +------------------+--------+--------+-------+ Mid                 134      29           +------------------+--------+--------+-------+ Distal              121      30           +------------------+--------+--------+-------+ +-----------------+--------+--------+-------+ Left Renal ArteryPSV cm/sEDV cm/sComment +-----------------+--------+--------+-------+ Origin              62      16           +-----------------+--------+--------+-------+  Proximal           123      22           +-----------------+--------+--------+-------+ Mid                142      34           +-----------------+--------+--------+-------+ Distal             103      30           +-----------------+--------+--------+-------+ +------------+--------+--------+----+-----------+--------+--------+----+ Right KidneyPSV cm/sEDV cm/sRI  Left KidneyPSV cm/sEDV cm/sRI   +------------+--------+--------+----+-----------+--------+--------+----+ Upper Pole  46      15      0.67Upper Pole 83      29      0.65 +------------+--------+--------+----+-----------+--------+--------+----+ Mid         30      10      0.66Mid        25      8       0.66 +------------+--------+--------+----+-----------+--------+--------+----+ Lower Pole  39      13      0.68Lower Pole 26      9       0.63  +------------+--------+--------+----+-----------+--------+--------+----+ Hilar       66      20      0.70Hilar      52      11      0.79 +------------+--------+--------+----+-----------+--------+--------+----+ +------------------+-------+------------------+-------+ Right Kidney             Left Kidney               +------------------+-------+------------------+-------+ RAR (manual)      1.4    RAR (manual)      1.4     +------------------+-------+------------------+-------+ Cortex thickness  7.50 mmCorex thickness   9.80 mm +------------------+-------+------------------+-------+ Kidney length (cm)13.90  Kidney length (cm)13.90   +------------------+-------+------------------+-------+  Summary: Renal:  Right: Normal size right kidney. Normal right Resisitive Index.        Normal cortical thickness of right kidney. No evidence of        right renal artery stenosis. RRV flow present. Left:  Normal size of left kidney. Normal left Resistive Index.        Normal cortical thickness of the left kidney. No evidence of        left renal artery stenosis. LRV flow present. Mesenteric: Normal Superior Mesenteric artery and Celiac artery findings.  *See table(s) above for measurements and observations.  Diagnosing physician: Larae Grooms MD  Electronically signed by Larae Grooms MD on 12/23/2020 at 1:13:09 PM.    Final     Assessment & Plan:   Problem List Items Addressed This Visit     Depression, major    Worse Increase Wellbutrin XL to 300 mg/d      Relevant Medications   buPROPion (WELLBUTRIN XL) 300 MG 24 hr tablet   Diabetes mellitus type 2 in obese ([place])    On Mounjaro now      Erectile dysfunction    Stable.  Continue with Viagra      Hypertension, uncontrolled    BP is better at home. No BP spikes      Relevant Medications   sildenafil (VIAGRA) 100 MG tablet   Morbid obesity with BMI of 40.0-44.9, adult ([place])    Better on Mounjaro         Meds  ordered this encounter  Medications   buPROPion [facility]  XL) 300 MG 24 hr tablet    Sig: Take 1 tablet (300 mg total) by mouth daily.    Dispense:  90 tablet    Refill:  3   sildenafil (VIAGRA) 100 MG tablet    Sig: TAKE 1 TABLET BY MOUTH ONCE DAILY AS NEEDED FOR ERECTILE DYSFUNCTION    Dispense:  12 tablet    Refill:  5      Follow-up: Return in about 3 months (around 07/17/2021) for a follow-up visit.  Walker Kehr, MD

## 2021-04-20 NOTE — Progress Notes (Signed)
Victor Byrd [place] [address] [place] Phone: [phone] Subjective:   [name], am serving as a scribe for Dr. Hulan Saas. This visit occurred during the SARS-CoV-2 public health emergency.  Safety protocols were in place, including screening questions prior to the visit, additional usage of staff PPE, and extensive cleaning of exam room while observing appropriate contact time as indicated for disinfecting solutions.   I'm seeing this patient by the request  of:  Plotnikov, Evie Lacks, MD  CC: Left knee pain, right ankle pain  HAL:[id number]  Victor Byrd is a 55 y.o. male coming in with complaint of knee, ankle and hip pain. Patient states left knee and right ankle pain. Weighed 320 at one point. Pain in knee is on both lateral and medial side. Ankle pain is on the lateral side. Restless leg syndrome in the past wants to pop ankle, but can't. Only sharp pain when aggravated. Voltaren gel helps temporarily and brace causes irritation.       Past Medical History:  Diagnosis Date   Anxiety    Asthma    Depression    Diabetes mellitus type II    Hyperlipidemia    Hypertension    Hyperthyroidism    Pt denies thyroid issues   OSA on CPAP    Dr. Chalmers Cater   Restless legs    Sleep apnea    cpap   Past Surgical History:  Procedure Laterality Date   COLONOSCOPY  11/01/2020   [place] Cellar, MD   [place]  2007   WISDOM TOOTH EXTRACTION  1992   Social History   Socioeconomic History   Marital status: Married    Spouse name: Not on file   Number of children: 2   Years of education: Not on file   Highest education level: Not on file  Occupational History   Occupation: Public house manager: UNEMPLOYED    Comment: out of work  Tobacco Use   Smoking status: Never   Smokeless tobacco: Never  Vaping Use   Vaping Use: Never used  Substance and Sexual Activity   Alcohol use: Yes     Alcohol/week: 0.0 standard drinks   Drug use: No   Sexual activity: Yes  Other Topics Concern   Not on file  Social History Narrative   Not on file   Social Determinants of Health   Financial Resource Strain: Not on file  Food Insecurity: Not on file  Transportation Needs: Not on file  Physical Activity: Not on file  Stress: Not on file  Social Connections: Not on file   Allergies  Allergen Reactions   Dapagliflozin Other (See Comments)   Ramipril     cough   Family History  Problem Relation Age of Onset   Diabetes Mother    Heart disease Father    Heart attack Father    Hypertension Father    Colon polyps Father    Diabetes Other    Hyperlipidemia Other    Lung cancer Other    Hypertension Other    Colon cancer Neg Hx    Esophageal cancer Neg Hx    Rectal cancer Neg Hx    Stomach cancer Neg Hx     Current Outpatient Medications (Endocrine & Metabolic):    Dulaglutide (TRULICITY) 3 DZ/3.2DJ SOPN, Inject 76m subcutaneous once a week   glimepiride (AMARYL) 4 MG tablet, Take 1 tablet (4 mg total)  by mouth 2 (two) times daily.   Insulin Glargine-yfgn (SEMGLEE, YFGN,) 100 UNIT/ML SOPN, Inject 75 units subcutaneous twice a day (Patient taking differently: 45 Units.)   metFORMIN (GLUCOPHAGE) 1000 MG tablet, TAKE 1 TABLET BY MOUTH 2 TIMES DAILY   tirzepatide (MOUNJARO) 10 MG/0.5ML Pen, Inject $RemoveBef'10mg'[id number]$  (0.5 ml) into the skin once a week  Current Outpatient Medications (Cardiovascular):    amLODipine (NORVASC) 10 MG tablet, Take 1 tablet (10 mg total) by mouth daily.   atorvastatin (LIPITOR) 40 MG tablet, TAKE 1 TABLET BY MOUTH ONCE DAILY   cloNIDine (CATAPRES) 0.2 MG tablet, Take 1 tablet (0.2 mg total) by mouth 2 (two) times daily.   doxazosin (CARDURA) 8 MG tablet, Take 1/2 to 1 tablet (4-8 mg total) by mouth daily.   furosemide (LASIX) 20 MG tablet, Take 1 to 2 tablets by mouth once a day as needed   sildenafil (VIAGRA) 100 MG tablet, TAKE 1 TABLET BY MOUTH ONCE DAILY AS  NEEDED FOR ERECTILE DYSFUNCTION   telmisartan-hydrochlorothiazide (MICARDIS HCT) 80-25 MG tablet, Take 1 tablet by mouth daily.  Current Outpatient Medications (Respiratory):    albuterol (VENTOLIN HFA) 108 (90 Base) MCG/ACT inhaler, INHALE 2 PUFFS INTO THE LUNGS EVERY 6 HOURS AS NEEDED FOR WHEEZING.  Current Outpatient Medications (Analgesics):    aspirin 81 MG tablet, Take 81 mg by mouth daily.   Current Outpatient Medications (Other):    Blood Glucose Monitoring Suppl (FREESTYLE FREEDOM LITE) w/Device KIT, Use as directed   buPROPion (WELLBUTRIN XL) 300 MG 24 hr tablet, Take 1 tablet (300 mg total) by mouth daily.   chlorhexidine (PERIDEX) 0.12 % solution, Use 41mls to swish in the mouth for 30 seconds then spit out 2 times per day after brushing teeth,   citalopram (CELEXA) 20 MG tablet, Take 1 tablet (20 mg total) by mouth daily.   glucose blood (ACCU-CHEK GUIDE) test strip, Use as directed (Patient taking differently: Use as directed)   glucose blood test strip, Use as directed (Patient taking differently: Use as directed)   Insulin Pen Needle 31G X 5 MM MISC, USE AS DIRECTED SUBCUTANEOUSLY 2 TIMES DAILY   Lancets (FREESTYLE) lancets, Use as directed (Patient taking differently: Use as directed)   Multiple Vitamin (MULTIVITAMIN WITH MINERALS) TABS tablet, Take 1 tablet by mouth daily.   nortriptyline (PAMELOR) 50 MG capsule, TAKE 1 OR 2 CAPSULES BY MOUTH AT BEDTIME   Omega-3 Fatty Acids (FISH OIL) 1000 MG CAPS, 1 capsule   Reviewed prior external information including notes and imaging from  primary care provider As well as notes that were available from care everywhere and other healthcare systems.  Past medical history, social, surgical and family history all reviewed in electronic medical record.  No pertanent information unless stated regarding to the chief complaint.   Review of Systems:  No headache, visual changes, nausea, vomiting, diarrhea, constipation, dizziness,  abdominal pain, skin rash, fevers, chills, night sweats, weight loss, swollen lymph nodes, body aches,  chest pain, shortness of breath, mood changes. POSITIVE muscle aches, joint swelling  Objective  Blood pressure 124/76, pulse 97, height 6' (1.829 m), weight 293 lb (132.9 kg), SpO2 99 %.   General: No apparent distress alert and oriented x3 mood and affect normal, dressed appropriately.  Morbidly obese HEENT: Pupils equal, extraocular movements intact  Respiratory: Patient's speak in full sentences and does not appear short of breath  Cardiovascular: No lower extremity edema, non tender, no erythema  Gait normal with good balance and coordination.  MSK: Right  ankle exam shows the patient does have swelling noted over the lateral aspect of the ankle.  Some limited range of motion in flexion and extension.  Patient does have some hypertrophy of the peroneal tendons noted posterior to the lateral malleolus.  Neurovascularly intact distally. Left knee exam does show an effusion noted of the patellofemoral joint.  Lateral tracking of the patella noted.  Abnormal thigh to calf ratio.  Patient does have instability with valgus and varus force.  Tender to palpation over the medial joint line and the patellofemoral joint line.  Limited muscular skeletal ultrasound was performed and interpreted by Hulan Saas, M  Limited ultrasound of the ankle shows the patient does have moderate to severe narrowing of the anterior lateral ankle.  Does have an effusion noted of the ankle as well on the anterior lateral and posterior lateral.  Patient also has what appears to be hypertrophy of the peroneal tendons with hypoechoic changes and scarring noted. Impression: Ankle arthritis with peroneal tendinitis chronic    Impression and Recommendations:     The above documentation has been reviewed and is accurate and complete Lyndal Pulley, DO

## 2021-04-22 NOTE — Assessment & Plan Note (Signed)
Stable.  Continue with Viagra

## 2021-04-23 ENCOUNTER — Encounter: Payer: Self-pay | Admitting: Family Medicine

## 2021-04-23 ENCOUNTER — Other Ambulatory Visit: Payer: Self-pay

## 2021-04-23 ENCOUNTER — Ambulatory Visit (INDEPENDENT_AMBULATORY_CARE_PROVIDER_SITE_OTHER): Payer: No Typology Code available for payment source | Admitting: Family Medicine

## 2021-04-23 ENCOUNTER — Ambulatory Visit: Payer: Self-pay

## 2021-04-23 ENCOUNTER — Ambulatory Visit (INDEPENDENT_AMBULATORY_CARE_PROVIDER_SITE_OTHER): Payer: No Typology Code available for payment source

## 2021-04-23 VITALS — BP 124/76 | HR 97 | Ht 72.0 in | Wt 293.0 lb

## 2021-04-23 DIAGNOSIS — M25571 Pain in right ankle and joints of right foot: Secondary | ICD-10-CM | POA: Diagnosis not present

## 2021-04-23 DIAGNOSIS — M19071 Primary osteoarthritis, right ankle and foot: Secondary | ICD-10-CM | POA: Diagnosis not present

## 2021-04-23 DIAGNOSIS — M1712 Unilateral primary osteoarthritis, left knee: Secondary | ICD-10-CM | POA: Diagnosis not present

## 2021-04-23 DIAGNOSIS — M25562 Pain in left knee: Secondary | ICD-10-CM | POA: Diagnosis not present

## 2021-04-23 NOTE — Patient Instructions (Addendum)
Xray today Custom Knee brace Vit D 2000iu and Tart Cherry 1200mg  daily [place] Recovery Sandals See you again in 6-8 weeks

## 2021-04-23 NOTE — Assessment & Plan Note (Signed)
Arthritis of the right ankle noted.  Discussed with patient it seems to be more the anterior lateral.  Does have some peroneal tendinitis also noted.  Discussed with patient about proper shoes, rocker-bottom shoes, icing regimen and home exercises.  Work with Product/process development scientist.  X-rays are pending.  Follow-up with me again in 4 to 8 weeks worsening pain may need to consider formal physical therapy.

## 2021-04-23 NOTE — Assessment & Plan Note (Signed)
  Degenerative arthritis of the left knee noted.  Discussed icing regimen and home exercises.  Did have an effusion noted today.  Patient would like to hold on any steroid injections secondary to patient having difficulty with his blood sugars.  Discussed that the patient could be a candidate for viscosupplementation and we will try to get approval.  Patient will increase activity slowly.  Due to the instability as well as the abnormal thigh to calf ratio patient would be a candidate for custom oval 8 brace with a Tru pull lite.  Patient will try conservative therapy with some the exercises and see me again in 6 weeks.  X-rays pending.

## 2021-04-27 ENCOUNTER — Other Ambulatory Visit (HOSPITAL_COMMUNITY): Payer: Self-pay

## 2021-04-30 ENCOUNTER — Other Ambulatory Visit (HOSPITAL_COMMUNITY): Payer: Self-pay

## 2021-05-04 ENCOUNTER — Other Ambulatory Visit: Payer: Self-pay | Admitting: Internal Medicine

## 2021-05-04 ENCOUNTER — Ambulatory Visit: Payer: No Typology Code available for payment source | Admitting: Family Medicine

## 2021-05-04 ENCOUNTER — Other Ambulatory Visit (HOSPITAL_COMMUNITY): Payer: Self-pay

## 2021-05-04 MED ORDER — NORTRIPTYLINE HCL 50 MG PO CAPS
ORAL_CAPSULE | ORAL | 3 refills | Status: DC
  Filled 2021-05-04: qty 180, 90d supply, fill #0
  Filled 2021-08-09: qty 60, 30d supply, fill #1
  Filled 2021-09-10: qty 60, 30d supply, fill #2

## 2021-05-07 ENCOUNTER — Other Ambulatory Visit (HOSPITAL_COMMUNITY): Payer: Self-pay

## 2021-05-08 ENCOUNTER — Other Ambulatory Visit (HOSPITAL_COMMUNITY): Payer: Self-pay

## 2021-05-08 MED ORDER — MOUNJARO 10 MG/0.5ML ~~LOC~~ SOAJ
SUBCUTANEOUS | 6 refills | Status: DC
  Filled 2021-05-08: qty 2, 28d supply, fill #0
  Filled 2021-06-30: qty 2, 28d supply, fill #1

## 2021-05-14 ENCOUNTER — Other Ambulatory Visit (HOSPITAL_COMMUNITY): Payer: Self-pay

## 2021-05-21 ENCOUNTER — Other Ambulatory Visit (HOSPITAL_COMMUNITY): Payer: Self-pay

## 2021-05-23 ENCOUNTER — Other Ambulatory Visit (HOSPITAL_COMMUNITY): Payer: Self-pay

## 2021-05-29 ENCOUNTER — Other Ambulatory Visit (HOSPITAL_COMMUNITY): Payer: Self-pay

## 2021-05-30 ENCOUNTER — Other Ambulatory Visit (HOSPITAL_COMMUNITY): Payer: Self-pay

## 2021-05-30 MED ORDER — AMLODIPINE BESYLATE 10 MG PO TABS
10.0000 mg | ORAL_TABLET | Freq: Every day | ORAL | 11 refills | Status: DC
  Filled 2021-05-30: qty 30, 30d supply, fill #0
  Filled 2021-08-15: qty 30, 30d supply, fill #1
  Filled 2021-09-10: qty 30, 30d supply, fill #2
  Filled 2021-10-11: qty 30, 30d supply, fill #3
  Filled 2021-11-09: qty 30, 30d supply, fill #4
  Filled 2021-12-05: qty 30, 30d supply, fill #5
  Filled 2022-01-08: qty 30, 30d supply, fill #6
  Filled 2022-04-11: qty 30, 30d supply, fill #7
  Filled 2022-05-05: qty 30, 30d supply, fill #8

## 2021-06-06 ENCOUNTER — Other Ambulatory Visit (HOSPITAL_COMMUNITY): Payer: Self-pay

## 2021-06-07 ENCOUNTER — Other Ambulatory Visit (HOSPITAL_COMMUNITY): Payer: Self-pay

## 2021-06-07 NOTE — Progress Notes (Signed)
[place] Phone: [phone] Subjective:    I'm seeing this patient by the request  of:  Plotnikov, Evie Lacks, MD  CC: Ankle pain and knee pain follow-up  FOY:[id number]  04/19/2021 Degenerative arthritis of the left knee noted.  Discussed icing regimen and home exercises.  Did have an effusion noted today.  Patient would like to hold on any steroid injections secondary to patient having difficulty with his blood sugars.  Discussed that the patient could be a candidate for viscosupplementation and we will try to get approval.  Patient will increase activity slowly.  Due to the instability as well as the abnormal thigh to calf ratio patient would be a candidate for custom oval 8 brace with a Tru pull lite.  Patient will try conservative therapy with some the exercises and see me again in 6 weeks.  X-rays pending.  Arthritis of the right ankle noted.  Discussed with patient it seems to be more the anterior lateral.  Does have some peroneal tendinitis also noted.  Discussed with patient about proper shoes, rocker-bottom shoes, icing regimen and home exercises.  Work with Product/process development scientist.  X-rays are pending.  Follow-up with me again in 4 to 8 weeks worsening pain may need to consider formal physical therapy.  Update 06/08/2021 ZEKI BEDROSIAN is a 56 y.o. male coming in with complaint of R ankle and L knee pain. Patient states that R ankle continues to bother him. Pain with PF and DF especially with driving. Pain over anterior portion of ankle.   Pain in L knee continues but is less when he wears the custom brace on outside of pants. Wearing brace today.   L knee xray 04/23/2021   IMPRESSION: 1. No acute fracture or dislocation. 2. Moderate suprapatellar effusion.  R ankle xray 04/23/2021 (-)    Past Medical History:  Diagnosis Date   Anxiety    Asthma    Depression    Diabetes mellitus type II    Hyperlipidemia     Hypertension    Hyperthyroidism    Pt denies thyroid issues   OSA on CPAP    Dr. Chalmers Cater   Restless legs    Sleep apnea    cpap   Past Surgical History:  Procedure Laterality Date   COLONOSCOPY  11/01/2020   Benson Cellar, MD   [place]  2007   WISDOM TOOTH EXTRACTION  1992   Social History   Socioeconomic History   Marital status: Married    Spouse name: Not on file   Number of children: 2   Years of education: Not on file   Highest education level: Not on file  Occupational History   Occupation: Public house manager: UNEMPLOYED    Comment: out of work  Tobacco Use   Smoking status: Never   Smokeless tobacco: Never  Vaping Use   Vaping Use: Never used  Substance and Sexual Activity   Alcohol use: Yes    Alcohol/week: 0.0 standard drinks   Drug use: No   Sexual activity: Yes  Other Topics Concern   Not on file  Social History Narrative   Not on file   Social Determinants of Health   Financial Resource Strain: Not on file  Food Insecurity: Not on file  Transportation Needs: Not on file  Physical Activity: Not on file  Stress: Not on file  Social Connections: Not on  file   Allergies  Allergen Reactions   Dapagliflozin Other (See Comments)   Ramipril     cough   Family History  Problem Relation Age of Onset   Diabetes Mother    Heart disease Father    Heart attack Father    Hypertension Father    Colon polyps Father    Diabetes Other    Hyperlipidemia Other    Lung cancer Other    Hypertension Other    Colon cancer Neg Hx    Esophageal cancer Neg Hx    Rectal cancer Neg Hx    Stomach cancer Neg Hx     Current Outpatient Medications (Endocrine & Metabolic):    Dulaglutide (TRULICITY) 3 JG/2.8ZM SOPN, Inject 70m subcutaneous once a week   glimepiride (AMARYL) 4 MG tablet, Take 1 tablet (4 mg total) by mouth 2 (two) times daily.   insulin glargine-yfgn (SEMGLEE, YFGN,) 100 UNIT/ML Pen, Inject 75 units  subcutaneous twice a day (Patient taking differently: 45 Units.)   metFORMIN (GLUCOPHAGE) 1000 MG tablet, TAKE 1 TABLET BY MOUTH 2 TIMES DAILY   tirzepatide (MOUNJARO) 10 MG/0.5ML Pen, Inject 163m into the skin once a week  Current Outpatient Medications (Cardiovascular):    amLODipine (NORVASC) 10 MG tablet, Take 1 tablet by mouth daily.   cloNIDine (CATAPRES) 0.2 MG tablet, Take 1 tablet (0.2 mg total) by mouth 2 (two) times daily.   doxazosin (CARDURA) 8 MG tablet, Take 1/2 to 1 tablet (4-8 mg total) by mouth daily.   furosemide (LASIX) 20 MG tablet, Take 1 to 2 tablets by mouth once a day as needed   sildenafil (VIAGRA) 100 MG tablet, TAKE 1 TABLET BY MOUTH ONCE DAILY AS NEEDED FOR ERECTILE DYSFUNCTION   telmisartan-hydrochlorothiazide (MICARDIS HCT) 80-25 MG tablet, Take 1 tablet by mouth daily.   atorvastatin (LIPITOR) 40 MG tablet, TAKE 1 TABLET BY MOUTH ONCE DAILY  Current Outpatient Medications (Respiratory):    albuterol (VENTOLIN HFA) 108 (90 Base) MCG/ACT inhaler, INHALE 2 PUFFS INTO THE LUNGS EVERY 6 HOURS AS NEEDED FOR WHEEZING.  Current Outpatient Medications (Analgesics):    aspirin 81 MG tablet, Take 81 mg by mouth daily.   Current Outpatient Medications (Other):    Blood Glucose Monitoring Suppl (FREESTYLE FREEDOM LITE) w/Device KIT, Use as directed   buPROPion (WELLBUTRIN XL) 300 MG 24 hr tablet, Take 1 tablet (300 mg total) by mouth daily.   chlorhexidine (PERIDEX) 0.12 % solution, Use 1574mto swish in the mouth for 30 seconds then spit out 2 times per day after brushing teeth,   citalopram (CELEXA) 20 MG tablet, Take 1 tablet (20 mg total) by mouth daily.   glucose blood (ACCU-CHEK GUIDE) test strip, Use as directed (Patient taking differently: Use as directed)   glucose blood test strip, Use as directed (Patient taking differently: Use as directed)   Insulin Pen Needle 31G X 5 MM MISC, USE AS DIRECTED SUBCUTANEOUSLY 2 TIMES DAILY   Lancets (FREESTYLE) lancets, Use  as directed (Patient taking differently: Use as directed)   Multiple Vitamin (MULTIVITAMIN WITH MINERALS) TABS tablet, Take 1 tablet by mouth daily.   nortriptyline (PAMELOR) 50 MG capsule, TAKE 1 OR 2 CAPSULES BY MOUTH AT BEDTIME   Omega-3 Fatty Acids (FISH OIL) 1000 MG CAPS, 1 capsule    Review of Systems:  No headache, visual changes, nausea, vomiting, diarrhea, constipation, dizziness, abdominal pain, skin rash, fevers, chills, night sweats, weight loss, swollen lymph nodes, body aches, joint swelling, chest pain, shortness of breath,  mood changes. POSITIVE muscle aches  Objective  Blood pressure 124/86, pulse 93, height 6' (1.829 m), SpO2 99 %.   General: No apparent distress alert and oriented x3 mood and affect normal, dressed appropriately.  HEENT: Pupils equal, extraocular movements intact  Respiratory: Patient's speak in full sentences and does not appear short of breath  Cardiovascular: No lower extremity edema, non tender, no erythema  Gait antalgic gait Left knee trace effusion noted but improvement noted with patient ambulation regarding the knee. Right ankle still has tenderness to palpation over the anterior lateral aspect of the ankle.  Does have some limited range of motion.  Limited muscular skeletal ultrasound was performed and interpreted by Hulan Saas, M  Limited ultrasound of patient's right ankle shows there is an effusion noted.  Does have narrowing noted of the anterior lateral ankle mortise.  Irregularity noted of the talar dome appears to be more chronic Impression: Still most likely focal arthritis of the ankle with questionable irregularity of the talar dome.   Impression and Recommendations:     The above documentation has been reviewed and is accurate and complete Lyndal Pulley, DO

## 2021-06-08 ENCOUNTER — Ambulatory Visit: Payer: BC Managed Care – PPO | Admitting: Family Medicine

## 2021-06-08 ENCOUNTER — Encounter: Payer: Self-pay | Admitting: Family Medicine

## 2021-06-08 ENCOUNTER — Other Ambulatory Visit (HOSPITAL_COMMUNITY): Payer: Self-pay

## 2021-06-08 ENCOUNTER — Ambulatory Visit: Payer: Self-pay

## 2021-06-08 ENCOUNTER — Other Ambulatory Visit: Payer: Self-pay

## 2021-06-08 VITALS — BP 124/86 | HR 93 | Ht 72.0 in

## 2021-06-08 DIAGNOSIS — M19071 Primary osteoarthritis, right ankle and foot: Secondary | ICD-10-CM

## 2021-06-08 DIAGNOSIS — G8929 Other chronic pain: Secondary | ICD-10-CM

## 2021-06-08 DIAGNOSIS — M25571 Pain in right ankle and joints of right foot: Secondary | ICD-10-CM

## 2021-06-08 NOTE — Assessment & Plan Note (Signed)
Patient does have some mild arthritis of the ankle.  Does still have a cortical irregularity noted of the anterior aspect of the ankle.  He does have what either appears to be a bone spur or possible cortical irregularity of the talar dome.  Patient does have effusion noted.  Discussed topical anti-inflammatories and icing regimen.  Discussed the importance of weight loss.  Follow-up with me again in 6 weeks.  Discussed otherwise will need custom orthotics or injections.

## 2021-06-08 NOTE — Patient Instructions (Signed)
Ankle swelling  Try voltaren gel and icing Can consider orthotics or injections Keep working on weight loss See me in 6 weeks

## 2021-06-15 MED FILL — Metformin HCl Tab 1000 MG: ORAL | 30 days supply | Qty: 60 | Fill #2 | Status: CN

## 2021-06-16 ENCOUNTER — Other Ambulatory Visit (HOSPITAL_COMMUNITY): Payer: Self-pay

## 2021-06-19 ENCOUNTER — Other Ambulatory Visit: Payer: Self-pay

## 2021-06-19 ENCOUNTER — Other Ambulatory Visit (HOSPITAL_COMMUNITY): Payer: Self-pay

## 2021-06-20 ENCOUNTER — Other Ambulatory Visit (HOSPITAL_COMMUNITY): Payer: Self-pay

## 2021-06-20 MED ORDER — METFORMIN HCL 1000 MG PO TABS
ORAL_TABLET | ORAL | 4 refills | Status: DC
  Filled 2021-06-20: qty 60, 30d supply, fill #0
  Filled 2021-07-09: qty 60, 30d supply, fill #1
  Filled 2021-08-17: qty 60, 30d supply, fill #2
  Filled 2021-09-10: qty 60, 30d supply, fill #3
  Filled 2021-10-11: qty 60, 30d supply, fill #4
  Filled 2021-11-09: qty 60, 30d supply, fill #5
  Filled 2021-12-21: qty 180, 90d supply, fill #6
  Filled 2022-03-02 – 2022-03-04 (×2): qty 180, 90d supply, fill #7
  Filled 2022-05-29: qty 180, 90d supply, fill #8

## 2021-06-30 ENCOUNTER — Other Ambulatory Visit (HOSPITAL_COMMUNITY): Payer: Self-pay

## 2021-07-02 ENCOUNTER — Other Ambulatory Visit (HOSPITAL_COMMUNITY): Payer: Self-pay

## 2021-07-09 ENCOUNTER — Other Ambulatory Visit (HOSPITAL_COMMUNITY): Payer: Self-pay

## 2021-07-12 ENCOUNTER — Telehealth: Payer: Self-pay | Admitting: Internal Medicine

## 2021-07-12 NOTE — Telephone Encounter (Signed)
Pt states sildenafil (VIAGRA) 100 MG tablet" is not having any effect"  Pt requesting an increase in dosage or an alternative medication  Pt requesting a c/b

## 2021-07-13 ENCOUNTER — Other Ambulatory Visit (HOSPITAL_COMMUNITY): Payer: Self-pay

## 2021-07-14 ENCOUNTER — Other Ambulatory Visit (HOSPITAL_COMMUNITY): Payer: Self-pay

## 2021-07-14 ENCOUNTER — Other Ambulatory Visit (HOSPITAL_BASED_OUTPATIENT_CLINIC_OR_DEPARTMENT_OTHER): Payer: Self-pay

## 2021-07-17 ENCOUNTER — Other Ambulatory Visit (HOSPITAL_COMMUNITY): Payer: Self-pay

## 2021-07-17 MED ORDER — TADALAFIL 20 MG PO TABS
20.0000 mg | ORAL_TABLET | ORAL | 3 refills | Status: DC | PRN
  Filled 2021-07-17: qty 12, 24d supply, fill #0

## 2021-07-17 NOTE — Telephone Encounter (Signed)
Ok Cialis.  Thanks

## 2021-07-23 ENCOUNTER — Other Ambulatory Visit: Payer: Self-pay

## 2021-07-23 ENCOUNTER — Ambulatory Visit: Payer: BC Managed Care – PPO | Admitting: Internal Medicine

## 2021-07-23 ENCOUNTER — Encounter: Payer: Self-pay | Admitting: Internal Medicine

## 2021-07-23 DIAGNOSIS — I10 Essential (primary) hypertension: Secondary | ICD-10-CM | POA: Diagnosis not present

## 2021-07-23 DIAGNOSIS — E669 Obesity, unspecified: Secondary | ICD-10-CM

## 2021-07-23 DIAGNOSIS — F339 Major depressive disorder, recurrent, unspecified: Secondary | ICD-10-CM

## 2021-07-23 DIAGNOSIS — E1169 Type 2 diabetes mellitus with other specified complication: Secondary | ICD-10-CM | POA: Diagnosis not present

## 2021-07-23 MED ORDER — TADALAFIL 20 MG PO TABS: ORAL_TABLET | ORAL | 3 refills | Status: DC

## 2021-07-23 NOTE — Assessment & Plan Note (Signed)
Cont on  Celexa to 20 mg/d and Wellbutrin XL to 300 mg/d

## 2021-07-23 NOTE — Assessment & Plan Note (Signed)
Charlie worked 6 days strait. Monitor BP at home Micardis HCT, Furosemide, amlodipine

## 2021-07-23 NOTE — Assessment & Plan Note (Addendum)
Pt stopped Trulicity Now  On Mounjaro Latest A1c - ? w/Dr Chalmers Cater, he will do it today

## 2021-07-23 NOTE — Progress Notes (Signed)
Subjective:  Patient ID: Victor Byrd, male    DOB: April 25, 1966  Age: 56 y.o. MRN: [id number]  CC: Follow-up   HPI KEYONDRE HEPBURN presents for DM, HTN, obesity f/u - on Mounjaro now. Charlie worked 6 days strait. C/o ED Latest A1c - ? w/Dr Chalmers Cater, he will do it today  Outpatient Medications Prior to Visit  Medication Sig Dispense Refill   albuterol (VENTOLIN HFA) 108 (90 Base) MCG/ACT inhaler INHALE 2 PUFFS INTO THE LUNGS EVERY 6 HOURS AS NEEDED FOR WHEEZING. 18 g 1   amLODipine (NORVASC) 10 MG tablet Take 1 tablet by mouth daily. 30 tablet 11   aspirin 81 MG tablet Take 81 mg by mouth daily.     Blood Glucose Monitoring Suppl (FREESTYLE FREEDOM LITE) w/Device KIT Use as directed 1 kit 0   buPROPion (WELLBUTRIN XL) 300 MG 24 hr tablet Take 1 tablet (300 mg total) by mouth daily. 90 tablet 3   chlorhexidine (PERIDEX) 0.12 % solution Use 74ms to swish in the mouth for 30 seconds then spit out 2 times per day after brushing teeth, 473 mL 3   citalopram (CELEXA) 20 MG tablet Take 1 tablet (20 mg total) by mouth daily. 90 tablet 3   cloNIDine (CATAPRES) 0.2 MG tablet Take 1 tablet (0.2 mg total) by mouth 2 (two) times daily. 60 tablet 11   doxazosin (CARDURA) 8 MG tablet Take 1/2 to 1 tablet (4-8 mg total) by mouth daily. 90 tablet 3   furosemide (LASIX) 20 MG tablet Take 1 to 2 tablets by mouth once a day as needed 180 tablet 0   glimepiride (AMARYL) 4 MG tablet Take 1 tablet (4 mg total) by mouth 2 (two) times daily. 180 tablet 4   glucose blood (ACCU-CHEK GUIDE) test strip Use as directed (Patient taking differently: Use as directed) 100 each 10   glucose blood test strip Use as directed (Patient taking differently: Use as directed) 100 each 0   insulin glargine-yfgn (SEMGLEE, YFGN,) 100 UNIT/ML Pen Inject 75 units subcutaneous twice a day (Patient taking differently: 45 Units.) 135 mL 4   Lancets (FREESTYLE) lancets Use as directed (Patient taking differently: Use as directed) 100 each 0    metFORMIN (GLUCOPHAGE) 1000 MG tablet Take 1 tablet by mouth 2 times a day 180 tablet 4   Multiple Vitamin (MULTIVITAMIN WITH MINERALS) TABS tablet Take 1 tablet by mouth daily.     nortriptyline (PAMELOR) 50 MG capsule TAKE 1 OR 2 CAPSULES BY MOUTH AT BEDTIME 180 capsule 3   Omega-3 Fatty Acids (FISH OIL) 1000 MG CAPS 1 capsule     telmisartan-hydrochlorothiazide (MICARDIS HCT) 80-25 MG tablet Take 1 tablet by mouth daily. 90 tablet 3   tirzepatide (MOUNJARO) 10 MG/0.5ML Pen Inject 1514m into the skin once a week 2 mL 6   Dulaglutide (TRULICITY) 3 MGXT/0.2IOOPN Inject 14m27mubcutaneous once a week 2 mL 6   sildenafil (VIAGRA) 100 MG tablet TAKE 1 TABLET BY MOUTH ONCE DAILY AS NEEDED FOR ERECTILE DYSFUNCTION 12 tablet 5   tadalafil (CIALIS) 20 MG tablet Take 1 tablet by mouth every other day as needed for erectile dysfunction. 12 tablet 3   atorvastatin (LIPITOR) 40 MG tablet TAKE 1 TABLET BY MOUTH ONCE DAILY 60 tablet 11   metFORMIN (GLUCOPHAGE) 1000 MG tablet TAKE 1 TABLET BY MOUTH 2 TIMES DAILY 180 tablet 3   tirzepatide (MOUNJARO) 10 MG/0.5ML Pen Inject 61m7m.5 ml) into the skin once a week  No facility-administered medications prior to visit.    ROS: Review of Systems  Constitutional:  Negative for appetite change, fatigue and unexpected weight change.  HENT:  Negative for congestion, nosebleeds, sneezing, sore throat and trouble swallowing.   Eyes:  Negative for itching and visual disturbance.  Respiratory:  Negative for cough.   Cardiovascular:  Negative for chest pain, palpitations and leg swelling.  Gastrointestinal:  Negative for abdominal distention, blood in stool, diarrhea and nausea.  Genitourinary:  Negative for frequency and hematuria.  Musculoskeletal:  Positive for back pain. Negative for gait problem, joint swelling and neck pain.  Skin:  Negative for rash.  Neurological:  Negative for dizziness, tremors, speech difficulty and weakness.  Psychiatric/Behavioral:   Negative for agitation, dysphoric mood and sleep disturbance. The patient is not nervous/anxious.    Objective:  BP (!) 160/102    Pulse 78    Temp 98.9 F (37.2 C) (Oral)    Ht 6' (1.829 m)    Wt 292 lb 6 oz (132.6 kg)    SpO2 100%    BMI 39.65 kg/m   BP Readings from Last 3 Encounters:  07/23/21 (!) 160/102  06/08/21 124/86  04/23/21 124/76    Wt Readings from Last 3 Encounters:  07/23/21 292 lb 6 oz (132.6 kg)  04/23/21 293 lb (132.9 kg)  04/16/21 298 lb 12.8 oz (135.5 kg)    Physical Exam Constitutional:      General: He is not in acute distress.    Appearance: He is well-developed. He is obese.     Comments: NAD  Eyes:     Conjunctiva/sclera: Conjunctivae normal.     Pupils: Pupils are equal, round, and reactive to light.  Neck:     Thyroid: No thyromegaly.     Vascular: No JVD.  Cardiovascular:     Rate and Rhythm: Normal rate and regular rhythm.     Heart sounds: Normal heart sounds. No murmur heard.   No friction rub. No gallop.  Pulmonary:     Effort: Pulmonary effort is normal. No respiratory distress.     Breath sounds: Normal breath sounds. No wheezing or rales.  Chest:     Chest wall: No tenderness.  Abdominal:     General: Bowel sounds are normal. There is no distension.     Palpations: Abdomen is soft. There is no mass.     Tenderness: There is no abdominal tenderness. There is no guarding or rebound.  Musculoskeletal:        General: No tenderness. Normal range of motion.     Cervical back: Normal range of motion.  Lymphadenopathy:     Cervical: No cervical adenopathy.  Skin:    General: Skin is warm and dry.     Findings: No rash.  Neurological:     Mental Status: He is alert and oriented to person, place, and time.     Cranial Nerves: No cranial nerve deficit.     Motor: No abnormal muscle tone.     Coordination: Coordination normal.     Gait: Gait normal.     Deep Tendon Reflexes: Reflexes are normal and symmetric.  Psychiatric:         Behavior: Behavior normal.        Thought Content: Thought content normal.        Judgment: Judgment normal.    Lab Results  Component Value Date   WBC 9.5 11/16/2020   HGB 13.0 11/16/2020   HCT 39.5 11/16/2020   PLT  305 11/16/2020   GLUCOSE 88 11/16/2020   CHOL 131 10/16/2020   TRIG 133.0 10/16/2020   HDL 44.70 10/16/2020   LDLCALC 59 10/16/2020   ALT 42 10/16/2020   AST 25 10/16/2020   NA 137 11/16/2020   K 3.3 (L) 11/16/2020   CL 99 11/16/2020   CREATININE 0.82 11/16/2020   BUN 13 11/16/2020   CO2 30 11/16/2020   TSH 2.36 10/16/2020   PSA 0.70 10/16/2020   HGBA1C 9.1 (H) 10/16/2020   MICROALBUR 96.7 (H) 10/16/2020    VAS US RENAL ARTERY DUPLEX  Result Date: 12/23/2020 ABDOMINAL VISCERAL Patient Name:  PRAVEEN COIA  Date of Exam:   12/22/2020 Medical Rec #: [id number]       Accession #:    [id number] Date of Birth: August 28, 1965       Patient Gender: M Patient Age:   24Y Exam Location:  [facility] Procedure:      VAS US RENAL ARTERY DUPLEX Referring Phys: [id number] [place] -------------------------------------------------------------------------------- Indications: Uncontrolled HTN; on several medications to control BP; denies              abdominal pain. High Risk Factors: Hypertension, hyperlipidemia, Diabetes, no history of                    smoking. Limitations: Air/bowel gas and obesity. Performing Technologist: Wilkie Aye RVT  Examination Guidelines: A complete evaluation includes B-mode imaging, spectral Doppler, color Doppler, and power Doppler as needed of all accessible portions of each vessel. Bilateral testing is considered an integral part of a complete examination. Limited examinations for reoccurring indications may be performed as noted.  Duplex Findings: +--------------------+--------+--------+------+--------+  Mesenteric           PSV cm/s EDV cm/s Plaque Comments  +--------------------+--------+--------+------+--------+  Aorta Prox              99       7                       +--------------------+--------+--------+------+--------+  Aorta Distal           105       2                      +--------------------+--------+--------+------+--------+  Celiac Artery Origin   113       24                     +--------------------+--------+--------+------+--------+  SMA Origin             152                              +--------------------+--------+--------+------+--------+  +------------------+--------+--------+-------+  Right Renal Artery PSV cm/s EDV cm/s Comment  +------------------+--------+--------+-------+  Origin                61       21             +------------------+--------+--------+-------+  Proximal              53       12             +------------------+--------+--------+-------+  Mid                  134       29             +------------------+--------+--------+-------+  Distal               121       30             +------------------+--------+--------+-------+ +-----------------+--------+--------+-------+  Left Renal Artery PSV cm/s EDV cm/s Comment  +-----------------+--------+--------+-------+  Origin               62       16             +-----------------+--------+--------+-------+  Proximal            123       22             +-----------------+--------+--------+-------+  Mid                 142       34             +-----------------+--------+--------+-------+  Distal              103       30             +-----------------+--------+--------+-------+ +------------+--------+--------+----+-----------+--------+--------+----+  Right Kidney PSV cm/s EDV cm/s RI   Left Kidney PSV cm/s EDV cm/s RI    +------------+--------+--------+----+-----------+--------+--------+----+  Upper Pole   46       15       0.67 Upper Pole  83       29       0.65  +------------+--------+--------+----+-----------+--------+--------+----+  Mid          30       10       0.66 Mid         25       8        0.66  +------------+--------+--------+----+-----------+--------+--------+----+   Lower Pole   39       13       0.68 Lower Pole  26       9        0.63  +------------+--------+--------+----+-----------+--------+--------+----+  Hilar        66       20       0.70 Hilar       52       11       0.79  +------------+--------+--------+----+-----------+--------+--------+----+ +------------------+-------+------------------+-------+  Right Kidney               Left Kidney                 +------------------+-------+------------------+-------+  RAR (manual)       1.4     RAR (manual)       1.4      +------------------+-------+------------------+-------+  Cortex thickness   7.50 mm Corex thickness    9.80 mm  +------------------+-------+------------------+-------+  Kidney length (cm) 13.90   Kidney length (cm) 13.90    +------------------+-------+------------------+-------+  Summary: Renal:  Right: Normal size right kidney. Normal right Resisitive Index.        Normal cortical thickness of right kidney. No evidence of        right renal artery stenosis. RRV flow present. Left:  Normal size of left kidney. Normal left Resistive Index.        Normal cortical thickness of the left kidney. No evidence of        left renal artery stenosis. LRV flow present. Mesenteric: Normal Superior Mesenteric artery and Celiac artery findings.  *See table(s) above for measurements and observations.  Diagnosing  physician: Larae Grooms MD  Electronically signed by Larae Grooms MD on 12/23/2020 at 1:13:09 PM.    Final     Assessment & Plan:   Problem List Items Addressed This Visit     Depression, major     Cont on  Celexa to 20 mg/d and Wellbutrin XL to 300 mg/d      Diabetes mellitus type 2 in obese ([place])    Pt stopped Trulicity Now  On Mounjaro Latest A1c - ? w/Dr Chalmers Cater, he will do it today      Hypertension, uncontrolled    Charlie worked 6 days strait. Monitor BP at home Micardis HCT, Furosemide, amlodipine      Relevant Medications   tadalafil (CIALIS) 20 MG tablet      Meds ordered  this encounter  Medications   tadalafil (CIALIS) 20 MG tablet    Sig: Use 20 mg q 2-3 days prn    Dispense:  12 tablet    Refill:  3      Follow-up: Return in about 3 months (around 10/20/2021) for a follow-up visit.  Walker Kehr, MD

## 2021-07-26 NOTE — Progress Notes (Signed)
Victor Byrd [place] [address] [place] Phone: [phone] Subjective:   Victor Byrd, am serving as a scribe for Dr. Hulan Saas. This visit occurred during the SARS-CoV-2 public health emergency.  Safety protocols were in place, including screening questions prior to the visit, additional usage of staff PPE, and extensive cleaning of exam room while observing appropriate contact time as indicated for disinfecting solutions.   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  Does still have a cortical irregularity noted of the anterior aspect of the ankle.  He does have what either appears to be a bone spur or possible cortical irregularity of the talar dome.  Patient does have effusion noted.  Discussed topical anti-inflammatories and icing regimen.  Discussed the importance of weight loss.  Follow-up with me again in 6 weeks.  Discussed otherwise will need custom orthotics or injections.  Update 07/27/2021 Victor Byrd is a 56 y.o. male coming in with complaint of L knee and R ankle pain. Patient states  doing a little better knee wise. Ankle has been acting up a little more. No new complaints.     Past Medical History:  Diagnosis Date   Anxiety    Asthma    Depression    Diabetes mellitus type II    Hyperlipidemia    Hypertension    Hyperthyroidism    Pt denies thyroid issues   OSA on CPAP    Dr. Chalmers Cater   Restless legs    Sleep apnea    cpap   Past Surgical History:  Procedure Laterality Date   COLONOSCOPY  11/01/2020   [place] Cellar, MD   [place]  2007   WISDOM TOOTH EXTRACTION  1992   Social History   Socioeconomic History   Marital status: Married    Spouse name: Not on file   Number of children: 2   Years of education: Not on file   Highest  education level: Not on file  Occupational History   Occupation: Public house manager: UNEMPLOYED    Comment: out of work  Tobacco Use   Smoking status: Never   Smokeless tobacco: Never  Vaping Use   Vaping Use: Never used  Substance and Sexual Activity   Alcohol use: Yes    Alcohol/week: 0.0 standard drinks   Drug use: No   Sexual activity: Yes  Other Topics Concern   Not on file  Social History Narrative   Not on file   Social Determinants of Health   Financial Resource Strain: Not on file  Food Insecurity: Not on file  Transportation Needs: Not on file  Physical Activity: Not on file  Stress: Not on file  Social Connections: Not on file   Allergies  Allergen Reactions   Dapagliflozin Other (See Comments)   Ramipril     cough   Family History  Problem Relation Age of Onset   Diabetes Mother    Heart disease Father    Heart attack Father    Hypertension Father    Colon polyps Father    Diabetes Other    Hyperlipidemia Other    Lung cancer Other    Hypertension Other    Colon cancer Neg Hx    Esophageal cancer Neg Hx  Rectal cancer Neg Hx    Stomach cancer Neg Hx     Current Outpatient Medications (Endocrine & Metabolic):    glimepiride (AMARYL) 4 MG tablet, Take 1 tablet (4 mg total) by mouth 2 (two) times daily.   insulin glargine-yfgn (SEMGLEE, YFGN,) 100 UNIT/ML Pen, Inject 75 units subcutaneous twice a day (Patient taking differently: 45 Units.)   metFORMIN (GLUCOPHAGE) 1000 MG tablet, TAKE 1 TABLET BY MOUTH 2 TIMES DAILY   metFORMIN (GLUCOPHAGE) 1000 MG tablet, Take 1 tablet by mouth 2 times a day   tirzepatide (MOUNJARO) 10 MG/0.5ML Pen, Inject 14m  into the skin once a week  Current Outpatient Medications (Cardiovascular):    amLODipine (NORVASC) 10 MG tablet, Take 1 tablet by mouth daily.   atorvastatin (LIPITOR) 40 MG tablet, TAKE 1 TABLET BY MOUTH ONCE DAILY   cloNIDine (CATAPRES) 0.2 MG tablet, Take 1 tablet (0.2 mg total) by mouth 2 (two)  times daily.   doxazosin (CARDURA) 8 MG tablet, Take 1/2 to 1 tablet (4-8 mg total) by mouth daily.   furosemide (LASIX) 20 MG tablet, Take 1 to 2 tablets by mouth once a day as needed   tadalafil (CIALIS) 20 MG tablet, Use 20 mg q 2-3 days prn   telmisartan-hydrochlorothiazide (MICARDIS HCT) 80-25 MG tablet, Take 1 tablet by mouth daily.  Current Outpatient Medications (Respiratory):    albuterol (VENTOLIN HFA) 108 (90 Base) MCG/ACT inhaler, INHALE 2 PUFFS INTO THE LUNGS EVERY 6 HOURS AS NEEDED FOR WHEEZING.  Current Outpatient Medications (Analgesics):    aspirin 81 MG tablet, Take 81 mg by mouth daily.   Current Outpatient Medications (Other):    Blood Glucose Monitoring Suppl (FREESTYLE FREEDOM LITE) w/Device KIT, Use as directed   buPROPion (WELLBUTRIN XL) 300 MG 24 hr tablet, Take 1 tablet (300 mg total) by mouth daily.   chlorhexidine (PERIDEX) 0.12 % solution, Use 141m to swish in the mouth for 30 seconds then spit out 2 times per day after brushing teeth,   citalopram (CELEXA) 20 MG tablet, Take 1 tablet (20 mg total) by mouth daily.   glucose blood (ACCU-CHEK GUIDE) test strip, Use as directed (Patient taking differently: Use as directed)   glucose blood test strip, Use as directed (Patient taking differently: Use as directed)   Lancets (FREESTYLE) lancets, Use as directed (Patient taking differently: Use as directed)   Multiple Vitamin (MULTIVITAMIN WITH MINERALS) TABS tablet, Take 1 tablet by mouth daily.   nortriptyline (PAMELOR) 50 MG capsule, TAKE 1 OR 2 CAPSULES BY MOUTH AT BEDTIME   Omega-3 Fatty Acids (FISH OIL) 1000 MG CAPS, 1 capsule   Reviewed prior external information including notes and imaging from  primary care provider As well as notes that were available from care everywhere and other healthcare systems.  Past medical history, social, surgical and family history all reviewed in electronic medical record.  No pertanent information unless stated regarding to  the chief complaint.   Review of Systems:  No headache, visual changes, nausea, vomiting, diarrhea, constipation, dizziness, abdominal pain, skin rash, fevers, chills, night sweats, weight loss, swollen lymph nodes, body aches, chest pain, shortness of breath, mood changes. POSITIVE muscle aches, joint swelling  Objective  Blood pressure (!) 132/96, pulse 99, height 6' (1.829 m), weight 291 lb (132 kg), SpO2 96 %.   General: No apparent distress alert and oriented x3 mood and affect normal, dressed appropriately.  Morbidly obese HEENT: Pupils equal, extraocular movements intact  Respiratory: Patient's speak in full sentences and does not  appear short of breath  Cardiovascular: Trace lower extremity edema, non tender, no erythema  Right  does have some swelling on the right greater than left.  Patient does have limited dorsiflexion noted on the right side. Left knee does have some crepitus noted.  Patient does have trace effusion noted of the patellofemoral, lateral tracking of the patella.  Patient is wearing a brace so unable to test the stability at the moment.   Impression and Recommendations:    The above documentation has been reviewed and is accurate and complete Lyndal Pulley, DO

## 2021-07-27 ENCOUNTER — Other Ambulatory Visit: Payer: Self-pay

## 2021-07-27 ENCOUNTER — Ambulatory Visit (INDEPENDENT_AMBULATORY_CARE_PROVIDER_SITE_OTHER): Payer: BC Managed Care – PPO | Admitting: Family Medicine

## 2021-07-27 ENCOUNTER — Other Ambulatory Visit (HOSPITAL_COMMUNITY): Payer: Self-pay

## 2021-07-27 DIAGNOSIS — M19071 Primary osteoarthritis, right ankle and foot: Secondary | ICD-10-CM

## 2021-07-27 DIAGNOSIS — M1712 Unilateral primary osteoarthritis, left knee: Secondary | ICD-10-CM | POA: Diagnosis not present

## 2021-07-27 NOTE — Assessment & Plan Note (Signed)
Chronic problem with wearing the brace, we discussed the possibility of viscosupplementation.  This is a chronic, with mild exacerbation.  Needs to avoid steroid injection secondary to patient still having significant difficulties with his blood sugars.  Patient will follow-up with me again when we have approval and consider this.

## 2021-07-27 NOTE — Patient Instructions (Signed)
Gel approval pending Ice ankles No big changes right now See you again in 2-3 months

## 2021-07-27 NOTE — Assessment & Plan Note (Signed)
Chronic, will continue to monitor, patient is still not wearing good shoes and somewhat noncompliant with the exercises.  Discussed compression and icing.  Follow-up again in 8-12 weeks

## 2021-07-28 ENCOUNTER — Other Ambulatory Visit (HOSPITAL_COMMUNITY): Payer: Self-pay

## 2021-07-28 MED ORDER — TADALAFIL 20 MG PO TABS
20.0000 mg | ORAL_TABLET | ORAL | 3 refills | Status: DC
  Filled 2021-07-28: qty 12, 24d supply, fill #0
  Filled 2021-08-17: qty 12, 24d supply, fill #1
  Filled 2021-09-10: qty 12, 24d supply, fill #2
  Filled 2021-09-26: qty 12, 24d supply, fill #3

## 2021-07-30 ENCOUNTER — Other Ambulatory Visit (HOSPITAL_COMMUNITY): Payer: Self-pay

## 2021-07-30 MED ORDER — ATORVASTATIN CALCIUM 10 MG PO TABS
ORAL_TABLET | ORAL | 4 refills | Status: DC
  Filled 2021-07-30: qty 30, 30d supply, fill #0
  Filled 2021-09-03: qty 30, 30d supply, fill #1
  Filled 2021-09-26: qty 30, 30d supply, fill #2
  Filled 2021-10-27: qty 30, 30d supply, fill #3
  Filled 2021-11-27: qty 30, 30d supply, fill #4

## 2021-08-02 ENCOUNTER — Other Ambulatory Visit (HOSPITAL_COMMUNITY): Payer: Self-pay

## 2021-08-10 ENCOUNTER — Other Ambulatory Visit (HOSPITAL_COMMUNITY): Payer: Self-pay

## 2021-08-15 ENCOUNTER — Other Ambulatory Visit (HOSPITAL_COMMUNITY): Payer: Self-pay

## 2021-08-17 ENCOUNTER — Other Ambulatory Visit (HOSPITAL_COMMUNITY): Payer: Self-pay

## 2021-08-17 ENCOUNTER — Other Ambulatory Visit: Payer: Self-pay | Admitting: Internal Medicine

## 2021-08-18 ENCOUNTER — Other Ambulatory Visit (HOSPITAL_COMMUNITY): Payer: Self-pay

## 2021-08-22 ENCOUNTER — Other Ambulatory Visit (HOSPITAL_COMMUNITY): Payer: Self-pay

## 2021-08-22 MED ORDER — FUROSEMIDE 20 MG PO TABS
ORAL_TABLET | ORAL | 0 refills | Status: DC
  Filled 2021-08-22: qty 60, 30d supply, fill #0
  Filled 2021-09-18: qty 60, 30d supply, fill #1
  Filled 2021-10-19: qty 60, 30d supply, fill #2

## 2021-08-24 ENCOUNTER — Other Ambulatory Visit (HOSPITAL_COMMUNITY): Payer: Self-pay

## 2021-09-03 ENCOUNTER — Other Ambulatory Visit (HOSPITAL_COMMUNITY): Payer: Self-pay

## 2021-09-07 ENCOUNTER — Other Ambulatory Visit (HOSPITAL_COMMUNITY): Payer: Self-pay

## 2021-09-10 ENCOUNTER — Other Ambulatory Visit (HOSPITAL_COMMUNITY): Payer: Self-pay

## 2021-09-17 ENCOUNTER — Ambulatory Visit: Payer: BC Managed Care – PPO | Admitting: Physician Assistant

## 2021-09-19 ENCOUNTER — Other Ambulatory Visit (HOSPITAL_COMMUNITY): Payer: Self-pay

## 2021-09-21 ENCOUNTER — Ambulatory Visit: Payer: BC Managed Care – PPO | Admitting: Physician Assistant

## 2021-09-27 ENCOUNTER — Other Ambulatory Visit (HOSPITAL_COMMUNITY): Payer: Self-pay

## 2021-09-28 ENCOUNTER — Other Ambulatory Visit (HOSPITAL_COMMUNITY): Payer: Self-pay

## 2021-09-28 NOTE — Progress Notes (Signed)
?Cardiology Office Note:   ? ?Date:  10/05/2021  ? ?ID:  Victor Byrd, DOB [birth year], MRN [id number] ? ?PCP:  Victor, Evie Lacks, MD ?  ?[facility] Providers ?Cardiologist:  Victor Martinique, MD ?Cardiology APP:  Ledora Bottcher, [place] { ? ?Referring MD: Cassandria Anger, MD  ? ?Chief Complaint  ?Patient presents with  ? Hypertension  ? ? ?History of Present Illness:   ? ?Victor Byrd is a 56 y.o. male with a hx of severe HTN and elevated coronary calcium score, morbid obesity, OSA, HTN, HLD, and DM2. Renal dopplers negative for RAS. Stress echo in 2013 was normal. Calcium score in 10/2020 was 161 placing him in the 88th percentile. He was last seen by Dr. Martinique 03/19/21 and was asymptomatic. He was planning evaluation with [facility] [place] [facility] for bariatric surgery. ? ?He presents for routine follow up. He has not followed up with surgeon for bariatric surgery. He reports BP has been "skyrocketing" at 205/101 associated with slight headache, no blurry vision. He had been compliant on on micardis, amlodipine, clonidine, and 40 mg lasix daily. He has noted elevated BP over the last 2 weeks.  ?He takes BP 10-15 min after medications. We discussed when else he can take his BP - after arriving at work and prior to lunch break. Given elevated heart rate, will add 6.25 mg coreg. He has cut out soda and has limited sodium intake. He is very frustrated that he has improved diet and soda intake and his pressure has gone up.  ? ? ?Past Medical History:  ?Diagnosis Date  ? Anxiety   ? Asthma   ? Depression   ? Diabetes mellitus type II   ? Hyperlipidemia   ? Hypertension   ? Hyperthyroidism   ? Pt denies thyroid issues  ? OSA on CPAP   ? Dr. Chalmers Cater  ? Restless legs   ? Sleep apnea   ? cpap  ? ? ?Past Surgical History:  ?Procedure Laterality Date  ? COLONOSCOPY  11/01/2020  ? [place] Cellar, MD  ? [place]  ? VASECTOMY  2007  ? [place]  ? ? ?Current Medications: ?Current  Meds  ?Medication Sig  ? albuterol (VENTOLIN HFA) 108 (90 Base) MCG/ACT inhaler INHALE 2 PUFFS INTO THE LUNGS EVERY 6 HOURS AS NEEDED FOR WHEEZING.  ? amLODipine (NORVASC) 10 MG tablet Take 1 tablet by mouth daily.  ? aspirin 81 MG tablet Take 81 mg by mouth daily.  ? atorvastatin (LIPITOR) 10 MG tablet Take 1 tablet by mouth once a day  ? Blood Glucose Monitoring Suppl (FREESTYLE FREEDOM LITE) w/Device KIT Use as directed  ? buPROPion (WELLBUTRIN XL) 300 MG 24 hr tablet Take 1 tablet (300 mg total) by mouth daily. (Patient taking differently: Take 150 mg by mouth daily. Taking 150 mg daily)  ? citalopram (CELEXA) 20 MG tablet Take 1 tablet (20 mg total) by mouth daily.  ? cloNIDine (CATAPRES) 0.2 MG tablet Take 1 tablet (0.2 mg total) by mouth 2 (two) times daily.  ? doxazosin (CARDURA) 8 MG tablet Take 1/2 to 1 tablet by mouth daily.  ? furosemide (LASIX) 20 MG tablet Take 1 to 2 tablets by mouth once a day as needed (Patient taking differently: 40 mg daily.)  ? Ginkgo Biloba 40 MG TABS Take by mouth.  ? glimepiride (AMARYL) 4 MG tablet Take 1 tablet (4 mg total) by mouth 2 (two) times daily.  ? glucose  blood (ACCU-CHEK GUIDE) test strip Use as directed (Patient taking differently: Use as directed)  ? glucose blood test strip Use as directed (Patient taking differently: Use as directed)  ? insulin glargine-yfgn (SEMGLEE, YFGN,) 100 UNIT/ML Pen Inject 75 units subcutaneous twice a day (Patient taking differently: 45 Units.)  ? Lancets (FREESTYLE) lancets Use as directed (Patient taking differently: Use as directed)  ? magnesium 30 MG tablet Take 30 mg by mouth daily.  ? metFORMIN (GLUCOPHAGE) 1000 MG tablet Take 1 tablet by mouth 2 times a day  ? MOUNJARO 12.5 MG/0.5ML Pen Inject into the skin.  ? Multiple Vitamin (MULTIVITAMIN WITH MINERALS) TABS tablet Take 1 tablet by mouth daily.  ? nortriptyline (PAMELOR) 50 MG capsule TAKE 1 OR 2 CAPSULES BY MOUTH AT BEDTIME  ? Omega-3 Fatty Acids (FISH OIL) 1000 MG CAPS 1  capsule  ? RYBELSUS 7 MG TABS Take 1 tablet by mouth daily.  ? tirzepatide (MOUNJARO) 10 MG/0.5ML Pen Inject 62m  into the skin once a week  ? [DISCONTINUED] carvedilol (COREG) 6.25 MG tablet Take 1 tablet (6.25 mg total) by mouth 2 (two) times daily.  ? [DISCONTINUED] telmisartan-hydrochlorothiazide (MICARDIS HCT) 80-25 MG tablet Take 1 tablet by mouth daily.  ?  ? ?Allergies:   Dapagliflozin and Ramipril  ? ?Social History  ? ?Socioeconomic History  ? Marital status: Married  ?  Spouse name: Not on file  ? Number of children: 2  ? Years of education: Not on file  ? Highest education level: Not on file  ?Occupational History  ? Occupation: Security  ?  Employer: UNEMPLOYED  ?  Comment: out of work  ?Tobacco Use  ? Smoking status: Never  ? Smokeless tobacco: Never  ?Vaping Use  ? Vaping Use: Never used  ?Substance and Sexual Activity  ? Alcohol use: Yes  ?  Alcohol/week: 0.0 standard drinks  ? Drug use: No  ? Sexual activity: Yes  ?Other Topics Concern  ? Not on file  ?Social History Narrative  ? Not on file  ? ?Social Determinants of Health  ? ?Financial Resource Strain: Not on file  ?Food Insecurity: Not on file  ?Transportation Needs: Not on file  ?Physical Activity: Not on file  ?Stress: Not on file  ?Social Connections: Not on file  ?  ? ?Family History: ?The patient's family history includes Colon polyps in his father; Diabetes in his mother and another family member; Heart attack in his father; Heart disease in his father; Hyperlipidemia in an other family member; Hypertension in his father and another family member; Lung cancer in an other family member. There is no history of Colon cancer, Esophageal cancer, Rectal cancer, or Stomach cancer. ? ?ROS:   ?Please see the history of present illness.    ? All other systems reviewed and are negative. ? ?EKGs/Labs/Other Studies Reviewed:   ? ?The following studies were reviewed today: ? ?CT cardiac calcium score 10/2020 ?IMPRESSION: ?Coronary calcium score of 161.  This was 88th percentile for age-, ?race-, and sex-matched controls. ? ? ?EKG:  EKG is  ordered today.  The ekg ordered today demonstrates sinus rhythm HR 91 right axis deviation ? ?Recent Labs: ?10/16/2020: ALT 42; TSH 2.36 ?11/16/2020: BUN 13; Creatinine, Ser 0.82; Hemoglobin 13.0; Platelets 305; Potassium 3.3; Sodium 137  ?Recent Lipid Panel ?   ?Component Value Date/Time  ? CHOL 131 10/16/2020 1449  ? TRIG 133.0 10/16/2020 1449  ? HDL 44.70 10/16/2020 1449  ? CHOLHDL 3 10/16/2020 1449  ? VLDL 26.6  10/16/2020 1449  ? LDLCALC 59 10/16/2020 1449  ? ? ? ?Risk Assessment/Calculations:   ?  ? ?    ? ?Physical Exam:   ? ?VS:  BP 126/80   Pulse 91   Resp 20   Ht 5' 11"  (1.803 m)   Wt 287 lb 6.4 oz (130.4 kg)   SpO2 96%   BMI 40.08 kg/m?    ? ?Today's Vitals  ? 10/05/21 1357 10/05/21 1500  ?BP: 140/80 126/80  ?Pulse: 91   ?Resp: 20   ?SpO2: 96%   ?Weight: 287 lb 6.4 oz (130.4 kg)   ?Height: 5' 11"  (1.803 m)   ?PainSc: 0-No pain   ? ?Body mass index is 40.08 kg/m?. ? ? ?Wt Readings from Last 3 Encounters:  ?10/05/21 287 lb 6.4 oz (130.4 kg)  ?07/27/21 291 lb (132 kg)  ?07/23/21 292 lb 6 oz (132.6 kg)  ?  ? ?GEN: obese male in NAD ?HEENT: Normal ?NECK: No JVD; No carotid bruits ?LYMPHATICS: No lymphadenopathy ?CARDIAC: RRR, no murmurs, rubs, gallops ?RESPIRATORY:  Clear to auscultation without rales, wheezing or rhonchi  ?ABDOMEN: Soft, non-tender, non-distended ?MUSCULOSKELETAL:  mild B LE edema; No deformity  ?SKIN: Warm and dry ?NEUROLOGIC:  Alert and oriented x 3 ?PSYCHIATRIC:  Normal affect  ? ?ASSESSMENT:   ? ?1. Primary hypertension   ?2. Hypercholesteremia   ?3. Coronary artery calcification   ?4. Morbid obesity with BMI of 40.0-44.9, adult ([place])   ?5. OSA (obstructive sleep apnea)   ?6. Swelling of lower extremity   ? ?PLAN:   ? ?In order of problems listed above: ? ? ?Hx of poorly controlled HTN ?- no evidence RAS ?- expect this will improve with weight loss ?- maintained on norvasc, clonidine,  telmisartan-HCTZ, cardura ?- increasing clonidine to TID will not work with is schedule, per patient ?- will start coreg 6.25 mg BID, HR 91 ?- he will keep a log before lunch ?- Addendum - on recheck, BP 126/80. Discussed th

## 2021-10-04 ENCOUNTER — Other Ambulatory Visit (HOSPITAL_COMMUNITY): Payer: Self-pay

## 2021-10-05 ENCOUNTER — Encounter: Payer: Self-pay | Admitting: Physician Assistant

## 2021-10-05 ENCOUNTER — Other Ambulatory Visit (HOSPITAL_COMMUNITY): Payer: Self-pay

## 2021-10-05 ENCOUNTER — Ambulatory Visit: Payer: BC Managed Care – PPO | Admitting: Physician Assistant

## 2021-10-05 ENCOUNTER — Ambulatory Visit (INDEPENDENT_AMBULATORY_CARE_PROVIDER_SITE_OTHER): Payer: BC Managed Care – PPO | Admitting: Physician Assistant

## 2021-10-05 VITALS — BP 126/80 | HR 91 | Resp 20 | Ht 71.0 in | Wt 287.4 lb

## 2021-10-05 DIAGNOSIS — Z6841 Body Mass Index (BMI) 40.0 and over, adult: Secondary | ICD-10-CM

## 2021-10-05 DIAGNOSIS — I2510 Atherosclerotic heart disease of native coronary artery without angina pectoris: Secondary | ICD-10-CM | POA: Diagnosis not present

## 2021-10-05 DIAGNOSIS — I10 Essential (primary) hypertension: Secondary | ICD-10-CM

## 2021-10-05 DIAGNOSIS — I2584 Coronary atherosclerosis due to calcified coronary lesion: Secondary | ICD-10-CM

## 2021-10-05 DIAGNOSIS — M7989 Other specified soft tissue disorders: Secondary | ICD-10-CM

## 2021-10-05 DIAGNOSIS — G4733 Obstructive sleep apnea (adult) (pediatric): Secondary | ICD-10-CM

## 2021-10-05 DIAGNOSIS — E7800 Pure hypercholesterolemia, unspecified: Secondary | ICD-10-CM

## 2021-10-05 MED ORDER — CARVEDILOL 6.25 MG PO TABS
6.2500 mg | ORAL_TABLET | Freq: Two times a day (BID) | ORAL | 3 refills | Status: DC
  Filled 2021-10-05: qty 60, 30d supply, fill #0

## 2021-10-05 MED ORDER — CARVEDILOL 12.5 MG PO TABS
6.2500 mg | ORAL_TABLET | Freq: Two times a day (BID) | ORAL | 0 refills | Status: DC
Start: 2021-10-05 — End: 2021-10-15
  Filled 2021-10-05: qty 30, 30d supply, fill #0

## 2021-10-05 NOTE — Patient Instructions (Signed)
Medication Instructions:  ?Carvedilol 6.25 mg one tab twice daily ? ?*If you need a refill on your cardiac medications before your next appointment, please call your pharmacy* ? ? ?Lab Work: ?NONE ordered at this time of appointment  ? ?If you have labs (blood work) drawn today and your tests are completely normal, you will receive your results only by: ?MyChart Message (if you have MyChart) OR ?A paper copy in the mail ?If you have any lab test that is abnormal or we need to change your treatment, we will call you to review the results. ? ? ?Testing/Procedures: ?NONE ordered at this time of appointment  ? ? ?Follow-Up: ?At [facility], you and your health needs are our priority.  As part of our continuing mission to provide you with exceptional heart care, we have created designated Provider Care Teams.  These Care Teams include your primary Cardiologist (physician) and Advanced Practice Providers (APPs -  Physician Assistants and Nurse Practitioners) who all work together to provide you with the care you need, when you need it. ? ?We recommend signing up for the patient portal called "MyChart".  Sign up information is provided on this After Visit Summary.  MyChart is used to connect with patients for Virtual Visits (Telemedicine).  Patients are able to view lab/test results, encounter notes, upcoming appointments, etc.  Non-urgent messages can be sent to your provider as well.   ?To learn more about what you can do with MyChart, go to [email or web address].   ? ?Your next appointment:   ?Keep Scheduled appointment  ? ?The format for your next appointment:   ?In Person ? ?Provider:   ?Fabian Sharp, PA-C      ? ? ?Other Instructions ?Monitor Blood Pressure at lunch as directed. Record BP and heart rate on blood pressure log. ?Please have BMET lab drawn with your Primary Care Doctor.  ? ?Important Information About Sugar ? ? ? ? ?  ?

## 2021-10-08 ENCOUNTER — Other Ambulatory Visit (HOSPITAL_COMMUNITY): Payer: Self-pay

## 2021-10-08 ENCOUNTER — Ambulatory Visit (INDEPENDENT_AMBULATORY_CARE_PROVIDER_SITE_OTHER): Payer: BC Managed Care – PPO | Admitting: Internal Medicine

## 2021-10-08 ENCOUNTER — Encounter: Payer: Self-pay | Admitting: Internal Medicine

## 2021-10-08 VITALS — BP 136/82 | HR 81 | Temp 98.2°F | Ht 71.0 in | Wt 287.0 lb

## 2021-10-08 DIAGNOSIS — R945 Abnormal results of liver function studies: Secondary | ICD-10-CM | POA: Insufficient documentation

## 2021-10-08 DIAGNOSIS — I2510 Atherosclerotic heart disease of native coronary artery without angina pectoris: Secondary | ICD-10-CM

## 2021-10-08 DIAGNOSIS — R809 Proteinuria, unspecified: Secondary | ICD-10-CM | POA: Insufficient documentation

## 2021-10-08 DIAGNOSIS — I2583 Coronary atherosclerosis due to lipid rich plaque: Secondary | ICD-10-CM

## 2021-10-08 DIAGNOSIS — E669 Obesity, unspecified: Secondary | ICD-10-CM

## 2021-10-08 DIAGNOSIS — G4733 Obstructive sleep apnea (adult) (pediatric): Secondary | ICD-10-CM

## 2021-10-08 DIAGNOSIS — G609 Hereditary and idiopathic neuropathy, unspecified: Secondary | ICD-10-CM | POA: Insufficient documentation

## 2021-10-08 DIAGNOSIS — I10 Essential (primary) hypertension: Secondary | ICD-10-CM

## 2021-10-08 DIAGNOSIS — E7800 Pure hypercholesterolemia, unspecified: Secondary | ICD-10-CM | POA: Insufficient documentation

## 2021-10-08 DIAGNOSIS — E1169 Type 2 diabetes mellitus with other specified complication: Secondary | ICD-10-CM

## 2021-10-08 DIAGNOSIS — R600 Localized edema: Secondary | ICD-10-CM

## 2021-10-08 DIAGNOSIS — F339 Major depressive disorder, recurrent, unspecified: Secondary | ICD-10-CM

## 2021-10-08 DIAGNOSIS — Z6841 Body Mass Index (BMI) 40.0 and over, adult: Secondary | ICD-10-CM

## 2021-10-08 DIAGNOSIS — E1165 Type 2 diabetes mellitus with hyperglycemia: Secondary | ICD-10-CM | POA: Insufficient documentation

## 2021-10-08 MED ORDER — DEXCOM G7 SENSOR MISC
11 refills | Status: DC
  Filled 2021-10-08: qty 3, 30d supply, fill #0

## 2021-10-08 MED ORDER — SILVER SULFADIAZINE 1 % EX CREA
1.0000 "application " | TOPICAL_CREAM | Freq: Every day | CUTANEOUS | 1 refills | Status: DC
  Filled 2021-10-08: qty 50, 30d supply, fill #0

## 2021-10-08 MED ORDER — DEXCOM G7 SENSOR MISC: 11 refills | Status: DC

## 2021-10-08 NOTE — Progress Notes (Signed)
? ?Subjective:  ?Patient ID: Victor Byrd, male    DOB: 1965-06-27  Age: 56 y.o. MRN: [id number] ? ?CC: No chief complaint on file. ? ? ?HPI ?[name] presents for HTN, edema, OSA - needs a new test ?C/o memory issues, fatigue ?Not using Mounjaro ?On Rybelsus ? ? ?Outpatient Medications Prior to Visit  ?Medication Sig Dispense Refill  ? albuterol (VENTOLIN HFA) 108 (90 Base) MCG/ACT inhaler INHALE 2 PUFFS INTO THE LUNGS EVERY 6 HOURS AS NEEDED FOR WHEEZING. 18 g 1  ? amLODipine (NORVASC) 10 MG tablet Take 1 tablet by mouth daily. 30 tablet 11  ? aspirin 81 MG tablet Take 81 mg by mouth daily.    ? atorvastatin (LIPITOR) 10 MG tablet Take 1 tablet by mouth once a day 90 tablet 4  ? Blood Glucose Monitoring Suppl (FREESTYLE FREEDOM LITE) w/Device KIT Use as directed (Patient not taking: Reported on 10/15/2021) 1 kit 0  ? buPROPion (WELLBUTRIN XL) 300 MG 24 hr tablet Take 1 tablet (300 mg total) by mouth daily. (Patient taking differently: Take 150 mg by mouth daily. Taking 150 mg daily) 90 tablet 3  ? citalopram (CELEXA) 20 MG tablet Take 1 tablet (20 mg total) by mouth daily. 90 tablet 3  ? cloNIDine (CATAPRES) 0.2 MG tablet Take 1 tablet (0.2 mg total) by mouth 2 (two) times daily. 60 tablet 11  ? doxazosin (CARDURA) 8 MG tablet Take 1/2 to 1 tablet by mouth daily. 90 tablet 3  ? furosemide (LASIX) 20 MG tablet Take 1 to 2 tablets by mouth once a day as needed (Patient taking differently: 40 mg daily.) 180 tablet 0  ? Ginkgo Biloba 40 MG TABS Take by mouth.    ? glimepiride (AMARYL) 4 MG tablet Take 1 tablet (4 mg total) by mouth 2 (two) times daily. 180 tablet 4  ? glucose blood (ACCU-CHEK GUIDE) test strip Use as directed (Patient not taking: Reported on 10/15/2021) 100 each 10  ? glucose blood test strip Use as directed (Patient not taking: Reported on 10/15/2021) 100 each 0  ? insulin glargine-yfgn (SEMGLEE, YFGN,) 100 UNIT/ML Pen Inject 75 units subcutaneous twice a day (Patient taking differently: 45  Units.) 135 mL 4  ? Lancets (FREESTYLE) lancets Use as directed (Patient not taking: Reported on 10/15/2021) 100 each 0  ? magnesium 30 MG tablet Take 30 mg by mouth daily.    ? metFORMIN (GLUCOPHAGE) 1000 MG tablet Take 1 tablet by mouth 2 times a day 180 tablet 4  ? Multiple Vitamin (MULTIVITAMIN WITH MINERALS) TABS tablet Take 1 tablet by mouth daily.    ? nortriptyline (PAMELOR) 50 MG capsule TAKE 1 OR 2 CAPSULES BY MOUTH AT BEDTIME 180 capsule 3  ? Omega-3 Fatty Acids (FISH OIL) 1000 MG CAPS 1 capsule    ? RYBELSUS 7 MG TABS Take 1 tablet by mouth daily.    ? tadalafil (CIALIS) 20 MG tablet 1 tablet as needed    ? carvedilol (COREG) 12.5 MG tablet Take 1/2 tablet by mouth 2 times daily. 90 tablet 0  ? MOUNJARO 12.5 MG/0.5ML Pen Inject into the skin.    ? tirzepatide (MOUNJARO) 10 MG/0.5ML Pen Inject 46m  into the skin once a week 2 mL 6  ? metFORMIN (GLUCOPHAGE) 1000 MG tablet TAKE 1 TABLET BY MOUTH 2 TIMES DAILY 180 tablet 3  ? ?No facility-administered medications prior to visit.  ? ? ?ROS: ?Review of Systems  ?Constitutional:  Positive for fatigue. Negative for appetite change and  unexpected weight change.  ?HENT:  Negative for congestion, nosebleeds, sneezing, sore throat and trouble swallowing.   ?Eyes:  Negative for itching and visual disturbance.  ?Respiratory:  Negative for cough.   ?Cardiovascular:  Positive for leg swelling. Negative for chest pain and palpitations.  ?Gastrointestinal:  Negative for abdominal distention, blood in stool, diarrhea and nausea.  ?Genitourinary:  Negative for frequency and hematuria.  ?Musculoskeletal:  Positive for arthralgias, back pain and gait problem. Negative for joint swelling and neck pain.  ?Skin:  Negative for rash.  ?Neurological:  Negative for dizziness, tremors, speech difficulty and weakness.  ?Psychiatric/Behavioral:  Negative for agitation, dysphoric mood and sleep disturbance. The patient is not nervous/anxious.   ? ?Objective:  ?BP 136/82 (BP Location:  Left Arm, Patient Position: Sitting, Cuff Size: Normal)   Pulse 81   Temp 98.2 ?F (36.8 ?C) (Oral)   Ht _0  (1.803 m)   Wt 287 lb (130.2 kg)   SpO2 97%   BMI 40.03 kg/m?  ? ?BP Readings from Last 3 Encounters:  ?10/15/21 140/74  ?10/08/21 136/82  ?10/05/21 126/80  ? ? ?Wt Readings from Last 3 Encounters:  ?10/15/21 299 lb (135.6 kg)  ?10/08/21 287 lb (130.2 kg)  ?10/05/21 287 lb 6.4 oz (130.4 kg)  ? ? ?Physical Exam ?Constitutional:   ?   General: He is not in acute distress. ?   Appearance: He is well-developed. He is obese.  ?   Comments: NAD  ?Eyes:  ?   Conjunctiva/sclera: Conjunctivae normal.  ?   Pupils: Pupils are equal, round, and reactive to light.  ?Neck:  ?   Thyroid: No thyromegaly.  ?   Vascular: No JVD.  ?Cardiovascular:  ?   Rate and Rhythm: Normal rate and regular rhythm.  ?   Heart sounds: Normal heart sounds. No murmur heard. ?  No friction rub. No gallop.  ?Pulmonary:  ?   Effort: Pulmonary effort is normal. No respiratory distress.  ?   Breath sounds: Normal breath sounds. No wheezing or rales.  ?Chest:  ?   Chest wall: No tenderness.  ?Abdominal:  ?   General: Bowel sounds are normal. There is no distension.  ?   Palpations: Abdomen is soft. There is no mass.  ?   Tenderness: There is no abdominal tenderness. There is no guarding or rebound.  ?Musculoskeletal:     ?   General: Tenderness present. Normal range of motion.  ?   Cervical back: Normal range of motion.  ?Lymphadenopathy:  ?   Cervical: No cervical adenopathy.  ?Skin: ?   General: Skin is warm and dry.  ?   Findings: No rash.  ?Neurological:  ?   Mental Status: He is alert and oriented to person, place, and time.  ?   Cranial Nerves: No cranial nerve deficit.  ?   Motor: No abnormal muscle tone.  ?   Coordination: Coordination abnormal.  ?   Gait: Gait abnormal.  ?   Deep Tendon Reflexes: Reflexes are normal and symmetric.  ?Psychiatric:     ?   Behavior: Behavior normal.     ?   Thought Content: Thought content normal.     ?    Judgment: Judgment normal.  ?L knee in a brace ? ?Lab Results  ?Component Value Date  ? WBC 9.5 11/16/2020  ? HGB 13.0 11/16/2020  ? HCT 39.5 11/16/2020  ? PLT 305 11/16/2020  ? GLUCOSE 88 11/16/2020  ? CHOL 131 10/16/2020  ? TRIG  133.0 10/16/2020  ? HDL 44.70 10/16/2020  ? [place] 59 10/16/2020  ? ALT 42 10/16/2020  ? AST 25 10/16/2020  ? NA 137 11/16/2020  ? K 3.3 (L) 11/16/2020  ? CL 99 11/16/2020  ? CREATININE 0.82 11/16/2020  ? BUN 13 11/16/2020  ? CO2 30 11/16/2020  ? TSH 2.36 10/16/2020  ? PSA 0.70 10/16/2020  ? HGBA1C 9.1 (H) 10/16/2020  ? MICROALBUR 96.7 (H) 10/16/2020  ? ? ?VAS US RENAL ARTERY DUPLEX ? ?Result Date: 12/23/2020 ?ABDOMINAL VISCERAL Patient Name:  ALVEY BROCKEL  Date of Exam:   12/22/2020 Medical Rec #: [id number]       Accession #:    [id number] Date of Birth: 1966-03-05       Patient Gender: M Patient Age:   32Y Exam Location:  [facility] Procedure:      VAS US RENAL ARTERY DUPLEX Referring Phys: [id number] Ander Slade [place] -------------------------------------------------------------------------------- Indications: Uncontrolled HTN; on several medications to control BP; denies              abdominal pain. High Risk Factors: Hypertension, hyperlipidemia, Diabetes, no history of                    smoking. Limitations: Air/bowel gas and obesity. Performing Technologist: Wilkie Aye RVT  Examination Guidelines: A complete evaluation includes B-mode imaging, spectral Doppler, color Doppler, and power Doppler as needed of all accessible portions of each vessel. Bilateral testing is considered an integral part of a complete examination. Limited examinations for reoccurring indications may be performed as noted.  Duplex Findings: +--------------------+--------+--------+------+--------+ Mesenteric          PSV cm/sEDV cm/sPlaqueComments +--------------------+--------+--------+------+--------+ Aorta Prox             99      7                    +--------------------+--------+--------+------+--------+ Aorta Distal          105      2                   +--------------------+--------+--------+------+--------+ Celiac Artery Origin  113      24                  +--------------------+--------+--------+------+--------+ SMA

## 2021-10-08 NOTE — Assessment & Plan Note (Signed)
On aspirin and atorvastatin ?

## 2021-10-08 NOTE — Assessment & Plan Note (Signed)
On  Celexa to 20 mg/d  ?

## 2021-10-08 NOTE — Assessment & Plan Note (Signed)
On Rybelsus 

## 2021-10-08 NOTE — Assessment & Plan Note (Signed)
On Rybelsus - 5/23 ?Dexcom 7 Rx given ?

## 2021-10-08 NOTE — Assessment & Plan Note (Signed)
Poss due to obesity and amlodipine ?B ankles ?Compression knee highs ?

## 2021-10-08 NOTE — Assessment & Plan Note (Signed)
Cont on Furosemide, Amlodipine, Coreg ?

## 2021-10-12 ENCOUNTER — Other Ambulatory Visit (HOSPITAL_COMMUNITY): Payer: Self-pay

## 2021-10-14 NOTE — Progress Notes (Addendum)
?Cardiology Office Note:   ? ?Date:  10/15/2021  ? ?ID:  Victor Byrd, DOB Nov 19, 1965, MRN [id number] ? ?PCP:  Plotnikov, Evie Lacks, MD ?  ?[facility] Providers ?Cardiologist:  Victor Martinique, MD ?Cardiology APP:  Ledora Bottcher, [place]    ? ?Referring MD: Cassandria Anger, MD  ? ?Chief Complaint  ?Patient presents with  ? Follow-up  ?  HTN  ? ? ?History of Present Illness:   ? ?Victor Byrd is a 56 y.o. male with a hx of severe HTN and elevated coronary calcium score, morbid obesity, OSA, HTN, HLD, and DM2. Renal dopplers negative for RAS. Stress echo in 2013 was normal. Calcium score in 10/2020 was 161 placing him in the 88th percentile. He was last seen by Dr. Martinique 03/19/21 and was asymptomatic. He was planning evaluation with [facility] [place] [facility] for bariatric surgery. ? ?I saw him in clinic on 10/05/2021 with hypertensive urgency and documented home BPs of 205/101.  His log presented BPs taken 10 to 15 minutes after medications.  His work schedule did not allow blood pressure log at other times.  He was very frustrated because he had reduced soda and sodium intake.  I opted to add carvedilol 6.25 mg twice daily given a heart rate of 91 in the office.  On recheck in the office his BP was 126/80.  I suggested that he may not need the carvedilol but he wanted to take it to see how his pressure responded. ? ?He presents back today for hypertension follow-up. He is here alone. He reports much better BP control.  ? ?126/80 ?94/69 ?139/77 ?140/74 ? ?He admits to not keeping a BP log as strictly as he should. He denies SOB, orthopnea, chest discomfort. He wears a knee brace on the left for bone on bone - needs a knee replacement. He does yard work but does not exercise.  ? ? ?Past Medical History:  ?Diagnosis Date  ? Anxiety   ? Asthma   ? Depression   ? Diabetes mellitus type II   ? Hyperlipidemia   ? Hypertension   ? Hyperthyroidism   ? Pt denies thyroid issues  ? OSA on CPAP   ? Dr. Chalmers Byrd  ? Restless  legs   ? Sleep apnea   ? cpap  ? ? ?Past Surgical History:  ?Procedure Laterality Date  ? COLONOSCOPY  11/01/2020  ? Victor Cellar, MD  ? [place]  ? VASECTOMY  2007  ? [place]  ? ? ?Current Medications: ?Current Meds  ?Medication Sig  ? albuterol (VENTOLIN HFA) 108 (90 Base) MCG/ACT inhaler INHALE 2 PUFFS INTO THE LUNGS EVERY 6 HOURS AS NEEDED FOR WHEEZING.  ? amLODipine (NORVASC) 10 MG tablet Take 1 tablet by mouth daily.  ? aspirin 81 MG tablet Take 81 mg by mouth daily.  ? atorvastatin (LIPITOR) 10 MG tablet Take 1 tablet by mouth once a day  ? buPROPion (WELLBUTRIN XL) 300 MG 24 hr tablet Take 1 tablet (300 mg total) by mouth daily. (Patient taking differently: Take 150 mg by mouth daily. Taking 150 mg daily)  ? carvedilol (COREG) 6.25 MG tablet Take 1 tablet (6.25 mg total) by mouth 2 (two) times daily with a meal.  ? citalopram (CELEXA) 20 MG tablet Take 1 tablet (20 mg total) by mouth daily.  ? cloNIDine (CATAPRES) 0.2 MG tablet Take 1 tablet (0.2 mg total) by mouth 2 (two) times daily.  ? doxazosin (  CARDURA) 8 MG tablet Take 1/2 to 1 tablet by mouth daily.  ? furosemide (LASIX) 20 MG tablet Take 1 to 2 tablets by mouth once a day as needed (Patient taking differently: 40 mg daily.)  ? Ginkgo Biloba 40 MG TABS Take by mouth.  ? glimepiride (AMARYL) 4 MG tablet Take 1 tablet (4 mg total) by mouth 2 (two) times daily.  ? insulin glargine-yfgn (SEMGLEE, YFGN,) 100 UNIT/ML Pen Inject 75 units subcutaneous twice a day (Patient taking differently: 45 Units.)  ? magnesium 30 MG tablet Take 30 mg by mouth daily.  ? metFORMIN (GLUCOPHAGE) 1000 MG tablet Take 1 tablet by mouth 2 times a day  ? Multiple Vitamin (MULTIVITAMIN WITH MINERALS) TABS tablet Take 1 tablet by mouth daily.  ? nortriptyline (PAMELOR) 50 MG capsule TAKE 1 OR 2 CAPSULES BY MOUTH AT BEDTIME  ? Omega-3 Fatty Acids (FISH OIL) 1000 MG CAPS 1 capsule  ? RYBELSUS 7 MG TABS Take 1 tablet by mouth daily.  ?  silver sulfADIAZINE (SILVADENE) 1 % cream Use 2 times a day as needed  ? tadalafil (CIALIS) 20 MG tablet 1 tablet as needed  ? [DISCONTINUED] carvedilol (COREG) 12.5 MG tablet Take 1/2 tablet by mouth 2 times daily.  ?  ? ?Allergies:   Dapagliflozin and Ramipril  ? ?Social History  ? ?Socioeconomic History  ? Marital status: Married  ?  Spouse name: Not on file  ? Number of children: 2  ? Years of education: Not on file  ? Highest education level: Not on file  ?Occupational History  ? Occupation: Security  ?  Employer: UNEMPLOYED  ?  Comment: out of work  ?Tobacco Use  ? Smoking status: Never  ? Smokeless tobacco: Never  ?Vaping Use  ? Vaping Use: Never used  ?Substance and Sexual Activity  ? Alcohol use: Yes  ?  Alcohol/week: 0.0 standard drinks  ? Drug use: No  ? Sexual activity: Yes  ?Other Topics Concern  ? Not on file  ?Social History Narrative  ? Not on file  ? ?Social Determinants of Health  ? ?Financial Resource Strain: Not on file  ?Food Insecurity: Not on file  ?Transportation Needs: Not on file  ?Physical Activity: Not on file  ?Stress: Not on file  ?Social Connections: Not on file  ?  ? ?Family History: ?The patient's family history includes Colon polyps in his father; Diabetes in his mother and another family member; Heart attack in his father; Heart disease in his father; Hyperlipidemia in an other family member; Hypertension in his father and another family member; Lung cancer in an other family member. There is no history of Colon cancer, Esophageal cancer, Rectal cancer, or Stomach cancer. ? ?ROS:   ?Please see the history of present illness.    ? All other systems reviewed and are negative. ? ?EKGs/Labs/Other Studies Reviewed:   ? ?The following studies were reviewed today: ? ?CT calcium score 10/27/20:  ?IMPRESSION: ?Coronary calcium score of 161. This was 88th percentile for age-, ?race-, and sex-matched controls. ? ?EKG:  EKG is not ordered today.   ? ?Recent Labs: ?10/16/2020: ALT 42; TSH  2.36 ?11/16/2020: BUN 13; Creatinine, Ser 0.82; Hemoglobin 13.0; Platelets 305; Potassium 3.3; Sodium 137  ?Recent Lipid Panel ?   ?Component Value Date/Time  ? CHOL 131 10/16/2020 1449  ? TRIG 133.0 10/16/2020 1449  ? HDL 44.70 10/16/2020 1449  ? CHOLHDL 3 10/16/2020 1449  ? VLDL 26.6 10/16/2020 1449  ? [place] 59 10/16/2020 1449  ? ? ? ?  Risk Assessment/Calculations:   ?  ? ?    ? ?Physical Exam:   ? ?VS:  BP 140/74   Pulse 80   Ht 6' (1.829 m)   Wt 299 lb (135.6 kg)   SpO2 96%   BMI 40.55 kg/m?    ? ?Wt Readings from Last 3 Encounters:  ?10/15/21 299 lb (135.6 kg)  ?10/08/21 287 lb (130.2 kg)  ?10/05/21 287 lb 6.4 oz (130.4 kg)  ?  ? ?GEN: morbidly obese male in NAD ?HEENT: Normal ?NECK: No JVD; No carotid bruits ?LYMPHATICS: No lymphadenopathy ?CARDIAC: RRR, no murmurs, rubs, gallops ?RESPIRATORY:  scattered wheezing ?ABDOMEN: Soft, non-tender, non-distended ?MUSCULOSKELETAL:  mild B LE edema; No deformity  ?SKIN: Warm and dry ?NEUROLOGIC:  Alert and oriented x 3 ?PSYCHIATRIC:  Normal affect  ? ?ASSESSMENT:   ? ?1. Primary hypertension   ?2. Hypercholesteremia   ?3. Coronary artery calcification   ?4. Aortic atherosclerosis ([place])   ?5. Morbid obesity with BMI of 40.0-44.9, adult ([place])   ?6. OSA (obstructive sleep apnea)   ? ?PLAN:   ? ?In order of problems listed above: ? ?Hx of poorly controlled HTN ?- no evidence RAS ?- expect this will improve with weight loss ?- maintained on norvasc, clonidine, telmisartan-HCTZ, cardura, and carvedilol ?- if pressure continue to be uncontrolled, could consider HTN clinic ? ? ?Hyperlipidemia with LDL goal < 70 ?10/16/2020: Cholesterol 131; HDL 44.70; LDL Cholesterol 59; Triglycerides 133.0; VLDL 26.6 ?- continue lipitor ? ? ?Coronary artery calcifications ?Aortic atherosclerosis ?Elevated coronary calcium score ?No chest pain ?Continue risk factor modification ?Continue ASA ? ? ?Morbid obesity with OSA ?On CPAP, but may need a new machine soon ?He will let us know if he  wants [place] to follow this ?Should he proceed with bariatric surgery he can complete more than 4.0 METS without angina.  He works at [employer] and has a physically demanding job. PCP has referred him to pulmonology. ? ? ?[place]

## 2021-10-15 ENCOUNTER — Encounter: Payer: Self-pay | Admitting: Physician Assistant

## 2021-10-15 ENCOUNTER — Other Ambulatory Visit (HOSPITAL_COMMUNITY): Payer: Self-pay

## 2021-10-15 ENCOUNTER — Ambulatory Visit: Payer: BC Managed Care – PPO | Admitting: Physician Assistant

## 2021-10-15 VITALS — BP 140/74 | HR 80 | Ht 72.0 in | Wt 299.0 lb

## 2021-10-15 DIAGNOSIS — I10 Essential (primary) hypertension: Secondary | ICD-10-CM

## 2021-10-15 DIAGNOSIS — Z6841 Body Mass Index (BMI) 40.0 and over, adult: Secondary | ICD-10-CM

## 2021-10-15 DIAGNOSIS — G4733 Obstructive sleep apnea (adult) (pediatric): Secondary | ICD-10-CM

## 2021-10-15 DIAGNOSIS — I2510 Atherosclerotic heart disease of native coronary artery without angina pectoris: Secondary | ICD-10-CM | POA: Diagnosis not present

## 2021-10-15 DIAGNOSIS — I2584 Coronary atherosclerosis due to calcified coronary lesion: Secondary | ICD-10-CM

## 2021-10-15 DIAGNOSIS — E7800 Pure hypercholesterolemia, unspecified: Secondary | ICD-10-CM

## 2021-10-15 DIAGNOSIS — I700 Atherosclerosis of aorta: Secondary | ICD-10-CM | POA: Diagnosis not present

## 2021-10-15 MED ORDER — CARVEDILOL 12.5 MG PO TABS
6.2500 mg | ORAL_TABLET | Freq: Two times a day (BID) | ORAL | 3 refills | Status: DC
  Filled 2021-10-15: qty 90, 90d supply, fill #0

## 2021-10-15 MED ORDER — CARVEDILOL 6.25 MG PO TABS
6.2500 mg | ORAL_TABLET | Freq: Two times a day (BID) | ORAL | 3 refills | Status: DC
  Filled 2021-10-15: qty 60, 30d supply, fill #0
  Filled 2021-11-09: qty 60, 30d supply, fill #1
  Filled 2021-12-22: qty 60, 30d supply, fill #2
  Filled 2022-02-04: qty 60, 30d supply, fill #3
  Filled 2022-03-10: qty 60, 30d supply, fill #4
  Filled 2022-04-10: qty 60, 30d supply, fill #5
  Filled 2022-04-27 – 2022-05-05 (×2): qty 60, 30d supply, fill #6
  Filled 2022-05-29: qty 60, 30d supply, fill #7
  Filled 2022-06-06 – 2022-06-30 (×3): qty 60, 30d supply, fill #8
  Filled 2022-08-02 – 2022-08-03 (×2): qty 60, 30d supply, fill #9
  Filled 2022-09-04: qty 60, 30d supply, fill #10
  Filled 2022-09-29: qty 60, 30d supply, fill #11

## 2021-10-15 NOTE — Patient Instructions (Signed)
Medication Instructions:  ?Your physician recommends that you continue on your current medications as directed. Please refer to the Current Medication list given to you today. ? ?*If you need a refill on your cardiac medications before your next appointment, please call your pharmacy* ? ? ?Lab Work: ?NONE ordered at this time of appointment   ? ?If you have labs (blood work) drawn today and your tests are completely normal, you will receive your results only by: ?MyChart Message (if you have MyChart) OR ?A paper copy in the mail ?If you have any lab test that is abnormal or we need to change your treatment, we will call you to review the results. ? ? ?Testing/Procedures: ?NONE ordered at this time of appointment  ? ? ? ?Follow-Up: ?At [facility], you and your health needs are our priority.  As part of our continuing mission to provide you with exceptional heart care, we have created designated Provider Care Teams.  These Care Teams include your primary Cardiologist (physician) and Advanced Practice Providers (APPs -  Physician Assistants and Nurse Practitioners) who all work together to provide you with the care you need, when you need it. ? ?We recommend signing up for the patient portal called "MyChart".  Sign up information is provided on this After Visit Summary.  MyChart is used to connect with patients for Virtual Visits (Telemedicine).  Patients are able to view lab/test results, encounter notes, upcoming appointments, etc.  Non-urgent messages can be sent to your provider as well.   ?To learn more about what you can do with MyChart, go to [email or web address].   ? ?Your next appointment:   ?3-4 month(s) ? ?The format for your next appointment:   ?In Person ? ?Provider:   ?Peter Martinique, MD  or Fabian Sharp, PA-C      ? ? ?Other Instructions ? ? ?Important Information About Sugar ? ? ? ? ? ? ?

## 2021-10-15 NOTE — Addendum Note (Signed)
Addended by: Derrick Ravel on: 0/94/7096 28:36 AM ? ? Modules accepted: Orders ? ?

## 2021-10-20 ENCOUNTER — Other Ambulatory Visit (HOSPITAL_COMMUNITY): Payer: Self-pay

## 2021-10-27 ENCOUNTER — Other Ambulatory Visit: Payer: Self-pay | Admitting: Internal Medicine

## 2021-10-30 ENCOUNTER — Other Ambulatory Visit (HOSPITAL_COMMUNITY): Payer: Self-pay

## 2021-10-30 MED ORDER — CITALOPRAM HYDROBROMIDE 20 MG PO TABS
20.0000 mg | ORAL_TABLET | Freq: Every day | ORAL | 1 refills | Status: DC
  Filled 2021-10-30: qty 30, 30d supply, fill #0
  Filled 2021-11-27: qty 30, 30d supply, fill #1
  Filled 2021-12-30: qty 30, 30d supply, fill #2

## 2021-10-30 MED ORDER — DOXAZOSIN MESYLATE 8 MG PO TABS
4.0000 mg | ORAL_TABLET | Freq: Every day | ORAL | 1 refills | Status: DC
  Filled 2021-10-30: qty 30, 30d supply, fill #0
  Filled 2021-11-27: qty 30, 30d supply, fill #1
  Filled 2021-12-31: qty 30, 30d supply, fill #2
  Filled 2022-01-08 – 2022-01-16 (×2): qty 30, 30d supply, fill #3
  Filled 2022-03-07: qty 30, 30d supply, fill #4
  Filled 2022-04-02: qty 30, 30d supply, fill #5

## 2021-11-05 ENCOUNTER — Other Ambulatory Visit (HOSPITAL_COMMUNITY): Payer: Self-pay

## 2021-11-10 ENCOUNTER — Other Ambulatory Visit (HOSPITAL_COMMUNITY): Payer: Self-pay

## 2021-11-14 ENCOUNTER — Other Ambulatory Visit: Payer: Self-pay | Admitting: Cardiology

## 2021-11-14 ENCOUNTER — Other Ambulatory Visit (HOSPITAL_COMMUNITY): Payer: Self-pay

## 2021-11-14 MED ORDER — TELMISARTAN-HCTZ 80-25 MG PO TABS
1.0000 | ORAL_TABLET | Freq: Every day | ORAL | 3 refills | Status: DC
  Filled 2021-11-14: qty 30, 30d supply, fill #0
  Filled 2021-12-05: qty 30, 30d supply, fill #1
  Filled 2022-01-08: qty 30, 30d supply, fill #2
  Filled 2022-02-13: qty 30, 30d supply, fill #3
  Filled 2022-03-17: qty 30, 30d supply, fill #4
  Filled 2022-04-18: qty 30, 30d supply, fill #5
  Filled 2022-06-06: qty 30, 30d supply, fill #6
  Filled 2022-06-08 – 2022-06-30 (×2): qty 30, 30d supply, fill #7
  Filled 2022-08-02 – 2022-08-03 (×2): qty 30, 30d supply, fill #8
  Filled 2022-09-04: qty 30, 30d supply, fill #9
  Filled 2022-09-29: qty 30, 30d supply, fill #10
  Filled 2022-10-31: qty 30, 30d supply, fill #11

## 2021-11-27 ENCOUNTER — Other Ambulatory Visit (HOSPITAL_COMMUNITY): Payer: Self-pay

## 2021-11-28 ENCOUNTER — Other Ambulatory Visit (HOSPITAL_COMMUNITY): Payer: Self-pay

## 2021-12-05 ENCOUNTER — Other Ambulatory Visit: Payer: Self-pay | Admitting: Internal Medicine

## 2021-12-06 ENCOUNTER — Other Ambulatory Visit (HOSPITAL_COMMUNITY): Payer: Self-pay

## 2021-12-06 MED ORDER — FUROSEMIDE 20 MG PO TABS
ORAL_TABLET | ORAL | 0 refills | Status: DC
  Filled 2021-12-06: qty 180, 90d supply, fill #0

## 2021-12-06 MED ORDER — CLONIDINE HCL 0.2 MG PO TABS
0.2000 mg | ORAL_TABLET | Freq: Two times a day (BID) | ORAL | 11 refills | Status: DC
  Filled 2021-12-06: qty 60, 30d supply, fill #0
  Filled 2021-12-31: qty 60, 30d supply, fill #1
  Filled 2022-02-04: qty 60, 30d supply, fill #2
  Filled 2022-03-10: qty 60, 30d supply, fill #3
  Filled 2022-04-10: qty 60, 30d supply, fill #4
  Filled 2022-05-14: qty 60, 30d supply, fill #5
  Filled 2022-06-14: qty 60, 30d supply, fill #6
  Filled 2022-07-17: qty 60, 30d supply, fill #7
  Filled 2022-08-11: qty 60, 30d supply, fill #8
  Filled 2022-09-12: qty 60, 30d supply, fill #9
  Filled 2022-10-15: qty 60, 30d supply, fill #10
  Filled 2022-11-17: qty 60, 30d supply, fill #11

## 2021-12-10 ENCOUNTER — Encounter: Payer: Self-pay | Admitting: Pulmonary Disease

## 2021-12-10 ENCOUNTER — Ambulatory Visit (INDEPENDENT_AMBULATORY_CARE_PROVIDER_SITE_OTHER): Payer: Self-pay | Admitting: Pulmonary Disease

## 2021-12-10 ENCOUNTER — Institutional Professional Consult (permissible substitution): Payer: BC Managed Care – PPO | Admitting: Pulmonary Disease

## 2021-12-10 VITALS — BP 128/80 | HR 65 | Ht 72.0 in | Wt 291.8 lb

## 2021-12-10 DIAGNOSIS — I10 Essential (primary) hypertension: Secondary | ICD-10-CM

## 2021-12-10 DIAGNOSIS — G4733 Obstructive sleep apnea (adult) (pediatric): Secondary | ICD-10-CM

## 2021-12-10 NOTE — Assessment & Plan Note (Signed)
Appears controlled on 3 medications. Close relation between OSA and hypertension was discussed

## 2021-12-10 NOTE — Assessment & Plan Note (Signed)
He has gained weight since his last sleep study in 2007.  He likely needs a higher pressure on his CPAP we will provide him with a new auto CPAP machine and set it to 8 to 15 cm.  We will obtain a download in 1 month after use and tweak settings as needed. I doubt that repeat sleep testing is necessary but if required for insurance purposes, we can proceed with a home sleep study  Weight loss encouraged, compliance with goal of at least 4-6 hrs every night is the expectation. Advised against medications with sedative side effects Cautioned against driving when sleepy - understanding that sleepiness will vary on a day to day basis

## 2021-12-10 NOTE — Patient Instructions (Addendum)
  X Rx for new autoCPAP 8-15 cm with nasal pillows to [facility]  Based on this, we will tweak settings

## 2021-12-10 NOTE — Progress Notes (Signed)
Subjective:    Patient ID: Victor Byrd, male    DOB: 25-Oct-1965, 56 y.o.   MRN: [id number]  HPI  56 year old obese man referred for management of OSA. He was diagnosed in 2007 and has been maintained on CPAP since then.  He brings in his second CPAP machine with nasal pillows which appears to be covered with dust.  [facility] He reports excessive daytime somnolence and always feeling worn out and tired.  Epworth sleepiness score is 15 and he reports sleepiness while sitting and reading, lying down to rest in the afternoons or sitting quietly after lunch or as a passenger in a car.  Bedtime is between 10 and 11 PM, sleep latency is 45 minutes to 1 hour, he reports 2-3 nocturnal awakenings, sleeps on his left side with 2 pillows and is out of bed at 6:30 AM feeling tired with sinus headaches.  He has to take a power nap after he returns from work at 5 PM There is no history suggestive of cataplexy, sleep paralysis or parasomnias  He lives with his wife and 2 children aged 6 and 61.  They have several dogs and cats inside the house and pigs in the yard   PMH -insulin requiring diabetes Hypertension   Significant tests/ events reviewed NPSG 07/2005 AHI 25/hour, lowest desaturation 82% , corrected by CPAP 8 cm with nasal pillows   Past Medical History:  Diagnosis Date   Anxiety    Asthma    Depression    Diabetes mellitus type II    Hyperlipidemia    Hypertension    Hyperthyroidism    Pt denies thyroid issues   OSA on CPAP    Dr. Chalmers Cater   Restless legs    Sleep apnea    cpap     Past Surgical History:  Procedure Laterality Date   COLONOSCOPY  11/01/2020   [place], [place]   [place]  2007   WISDOM TOOTH EXTRACTION  1992    Allergies  Allergen Reactions   Dapagliflozin Other (See Comments)   Ramipril     cough    Social History   Socioeconomic History   Marital status: Married    Spouse name: Not on file    Number of children: 2   Years of education: Not on file   Highest education level: Not on file  Occupational History   Occupation: Public house manager: UNEMPLOYED    Comment: out of work  Tobacco Use   Smoking status: Never   Smokeless tobacco: Never  Vaping Use   Vaping Use: Never used  Substance and Sexual Activity   Alcohol use: Yes    Alcohol/week: 0.0 standard drinks of alcohol   Drug use: No   Sexual activity: Yes  Other Topics Concern   Not on file  Social History Narrative   Not on file   Social Determinants of Health   Financial Resource Strain: Not on file  Food Insecurity: Not on file  Transportation Needs: Not on file  Physical Activity: Not on file  Stress: Not on file  Social Connections: Not on file  Intimate Partner Violence: Not on file      Family History  Problem Relation Age of Onset   Diabetes Mother    Heart disease Father    Heart attack Father    Hypertension Father    Colon polyps Father    Diabetes Other  Hyperlipidemia Other    Lung cancer Other    Hypertension Other    Colon cancer Neg Hx    Esophageal cancer Neg Hx    Rectal cancer Neg Hx    Stomach cancer Neg Hx       Review of Systems Positive for sinus headaches in the morning, anxiety and depression  Constitutional: negative for anorexia, fevers and sweats  Eyes: negative for irritation, redness and visual disturbance  Ears, nose, mouth, throat, and face: negative for earaches, epistaxis, nasal congestion and sore throat  Respiratory: negative for cough, dyspnea on exertion, sputum and wheezing  Cardiovascular: negative for chest pain, dyspnea, lower extremity edema, orthopnea, palpitations and syncope  Gastrointestinal: negative for abdominal pain, constipation, diarrhea, melena, nausea and vomiting  Genitourinary:negative for dysuria, frequency and hematuria  Hematologic/lymphatic: negative for bleeding, easy bruising and lymphadenopathy   Musculoskeletal:negative for arthralgias, muscle weakness and stiff joints  Neurological: negative for coordination problems, gait problems and weakness  Endocrine: negative for diabetic symptoms including polydipsia, polyuria and weight loss     Objective:   Physical Exam   Gen. Pleasant, obese, in no distress, normal affect ENT - no pallor,icterus, no post nasal drip, class 2-3 airway Neck: No JVD, no thyromegaly, no carotid bruits Lungs: no use of accessory muscles, no dullness to percussion, decreased without rales or rhonchi  Cardiovascular: Rhythm regular, heart sounds  normal, no murmurs or gallops, no peripheral edema Abdomen: soft and non-tender, no hepatosplenomegaly, BS normal. Musculoskeletal: No deformities, no cyanosis or clubbing Neuro:  alert, non focal, no tremors        Assessment & Plan:

## 2021-12-12 ENCOUNTER — Other Ambulatory Visit (HOSPITAL_COMMUNITY): Payer: Self-pay

## 2021-12-12 MED ORDER — RYBELSUS 7 MG PO TABS
ORAL_TABLET | ORAL | 3 refills | Status: DC
  Filled 2021-12-12 – 2021-12-17 (×3): qty 30, 30d supply, fill #0

## 2021-12-17 ENCOUNTER — Telehealth: Payer: Self-pay | Admitting: Pulmonary Disease

## 2021-12-17 ENCOUNTER — Other Ambulatory Visit (HOSPITAL_COMMUNITY): Payer: Self-pay

## 2021-12-17 DIAGNOSIS — G4733 Obstructive sleep apnea (adult) (pediatric): Secondary | ICD-10-CM

## 2021-12-17 NOTE — Telephone Encounter (Signed)
Dr. Elsworth Soho, please see message from [facility] and advise on this. Do you want [place] to go ahead and order a sleep study since pt's last one was done 2007?

## 2021-12-17 NOTE — Telephone Encounter (Signed)
Message received from [facility],   Order has been received, however we are in need of the following items   1. copy of diagnostic sleep study.  per notes from MD it looks like this was done back in 2007.  I need a copy of the demography sleep study report to process this order request.   Order is on hold at this time.   Thank you,   Demetrius Charity

## 2021-12-19 NOTE — Telephone Encounter (Signed)
Called and spoke with patient. He is ok with doing a HST. I explained to him the process, he verbalized understanding.   Order has been placed.   Nothing further needed at time of call.

## 2021-12-19 NOTE — Telephone Encounter (Signed)
Rigoberto Noel, MD  You Yesterday (8:15 AM)    Pl let pt know & OK to schedule Home sleep study    Attempted to call pt to let him know info received but unable to reach. Left message for him to return call.  Will place order for HST once we have spoken to pt about it.

## 2021-12-21 ENCOUNTER — Other Ambulatory Visit (HOSPITAL_COMMUNITY): Payer: Self-pay

## 2021-12-26 ENCOUNTER — Encounter: Payer: Self-pay | Admitting: Internal Medicine

## 2021-12-28 ENCOUNTER — Other Ambulatory Visit (HOSPITAL_COMMUNITY): Payer: Self-pay

## 2021-12-31 ENCOUNTER — Other Ambulatory Visit (HOSPITAL_COMMUNITY): Payer: Self-pay

## 2022-01-06 ENCOUNTER — Other Ambulatory Visit (HOSPITAL_COMMUNITY): Payer: Self-pay

## 2022-01-06 ENCOUNTER — Other Ambulatory Visit: Payer: Self-pay | Admitting: Internal Medicine

## 2022-01-06 MED ORDER — CITALOPRAM HYDROBROMIDE 40 MG PO TABS
40.0000 mg | ORAL_TABLET | Freq: Every day | ORAL | 1 refills | Status: DC
  Filled 2022-01-06: qty 90, 90d supply, fill #0

## 2022-01-07 ENCOUNTER — Other Ambulatory Visit: Payer: Self-pay | Admitting: *Deleted

## 2022-01-07 ENCOUNTER — Other Ambulatory Visit (HOSPITAL_COMMUNITY): Payer: Self-pay

## 2022-01-07 DIAGNOSIS — E669 Obesity, unspecified: Secondary | ICD-10-CM

## 2022-01-07 DIAGNOSIS — I10 Essential (primary) hypertension: Secondary | ICD-10-CM

## 2022-01-07 DIAGNOSIS — E785 Hyperlipidemia, unspecified: Secondary | ICD-10-CM

## 2022-01-07 DIAGNOSIS — Z0000 Encounter for general adult medical examination without abnormal findings: Secondary | ICD-10-CM

## 2022-01-08 ENCOUNTER — Other Ambulatory Visit (HOSPITAL_COMMUNITY): Payer: Self-pay

## 2022-01-10 ENCOUNTER — Other Ambulatory Visit: Payer: No Typology Code available for payment source

## 2022-01-11 ENCOUNTER — Other Ambulatory Visit (INDEPENDENT_AMBULATORY_CARE_PROVIDER_SITE_OTHER): Payer: No Typology Code available for payment source

## 2022-01-11 DIAGNOSIS — Z125 Encounter for screening for malignant neoplasm of prostate: Secondary | ICD-10-CM | POA: Diagnosis not present

## 2022-01-11 DIAGNOSIS — E669 Obesity, unspecified: Secondary | ICD-10-CM | POA: Diagnosis not present

## 2022-01-11 DIAGNOSIS — E1169 Type 2 diabetes mellitus with other specified complication: Secondary | ICD-10-CM | POA: Diagnosis not present

## 2022-01-11 DIAGNOSIS — Z0000 Encounter for general adult medical examination without abnormal findings: Secondary | ICD-10-CM

## 2022-01-11 DIAGNOSIS — E785 Hyperlipidemia, unspecified: Secondary | ICD-10-CM | POA: Diagnosis not present

## 2022-01-11 DIAGNOSIS — I10 Essential (primary) hypertension: Secondary | ICD-10-CM | POA: Diagnosis not present

## 2022-01-11 LAB — CBC WITH DIFFERENTIAL/PLATELET
Basophils Absolute: 0.1 10*3/uL (ref 0.0–0.1)
Basophils Relative: 1.2 % (ref 0.0–3.0)
Eosinophils Absolute: 0.3 10*3/uL (ref 0.0–0.7)
Eosinophils Relative: 3 % (ref 0.0–5.0)
HCT: 37.8 % — ABNORMAL LOW (ref 39.0–52.0)
Hemoglobin: 12.5 g/dL — ABNORMAL LOW (ref 13.0–17.0)
Lymphocytes Relative: 26.9 % (ref 12.0–46.0)
Lymphs Abs: 2.4 10*3/uL (ref 0.7–4.0)
MCHC: 33.1 g/dL (ref 30.0–36.0)
MCV: 80.6 fl (ref 78.0–100.0)
Monocytes Absolute: 0.8 10*3/uL (ref 0.1–1.0)
Monocytes Relative: 8.6 % (ref 3.0–12.0)
Neutro Abs: 5.5 10*3/uL (ref 1.4–7.7)
Neutrophils Relative %: 60.3 % (ref 43.0–77.0)
Platelets: 255 10*3/uL (ref 150.0–400.0)
RBC: 4.69 Mil/uL (ref 4.22–5.81)
RDW: 13.7 % (ref 11.5–15.5)
WBC: 9 10*3/uL (ref 4.0–10.5)

## 2022-01-11 LAB — LIPID PANEL
Cholesterol: 162 mg/dL (ref 0–200)
HDL: 39.2 mg/dL (ref >39.00)
NonHDL: 122.39
Total CHOL/HDL Ratio: 4
Triglycerides: 211 mg/dL — ABNORMAL HIGH (ref 0.0–149.0)
VLDL: 42.2 mg/dL — ABNORMAL HIGH (ref 0.0–40.0)

## 2022-01-11 LAB — HEMOGLOBIN A1C: Hgb A1c MFr Bld: 8.6 % — ABNORMAL HIGH (ref 4.6–6.5)

## 2022-01-11 LAB — URINALYSIS, ROUTINE W REFLEX MICROSCOPIC
Bilirubin Urine: NEGATIVE
Hgb urine dipstick: NEGATIVE
Ketones, ur: NEGATIVE
Leukocytes,Ua: NEGATIVE
Nitrite: NEGATIVE
Specific Gravity, Urine: 1.01 (ref 1.000–1.030)
Total Protein, Urine: NEGATIVE
Urine Glucose: 100 — AB
Urobilinogen, UA: 0.2 (ref 0.0–1.0)
pH: 6.5 (ref 5.0–8.0)

## 2022-01-11 LAB — COMPREHENSIVE METABOLIC PANEL
ALT: 27 U/L (ref 0–53)
AST: 18 U/L (ref 0–37)
Albumin: 4.3 g/dL (ref 3.5–5.2)
Alkaline Phosphatase: 49 U/L (ref 39–117)
BUN: 16 mg/dL (ref 6–23)
CO2: 33 mEq/L — ABNORMAL HIGH (ref 19–32)
Calcium: 9 mg/dL (ref 8.4–10.5)
Chloride: 101 mEq/L (ref 96–112)
Creatinine, Ser: 0.82 mg/dL (ref 0.40–1.50)
GFR: 99.56 mL/min (ref >60.00)
Glucose, Bld: 133 mg/dL — ABNORMAL HIGH (ref 70–99)
Potassium: 4.1 mEq/L (ref 3.5–5.1)
Sodium: 139 mEq/L (ref 135–145)
Total Bilirubin: 0.5 mg/dL (ref 0.2–1.2)
Total Protein: 7.2 g/dL (ref 6.0–8.3)

## 2022-01-11 LAB — LDL CHOLESTEROL, DIRECT: Direct LDL: 96 mg/dL

## 2022-01-11 LAB — MICROALBUMIN / CREATININE URINE RATIO
Creatinine,U: 97.3 mg/dL
Microalb Creat Ratio: 3.5 mg/g (ref 0.0–30.0)
Microalb, Ur: 3.4 mg/dL — ABNORMAL HIGH (ref 0.0–1.9)

## 2022-01-11 LAB — TSH: TSH: 2.13 u[IU]/mL (ref 0.35–5.50)

## 2022-01-11 LAB — PSA: PSA: 0.64 ng/mL (ref 0.10–4.00)

## 2022-01-14 ENCOUNTER — Other Ambulatory Visit (HOSPITAL_COMMUNITY): Payer: Self-pay

## 2022-01-14 ENCOUNTER — Encounter: Payer: Self-pay | Admitting: Internal Medicine

## 2022-01-14 ENCOUNTER — Ambulatory Visit (INDEPENDENT_AMBULATORY_CARE_PROVIDER_SITE_OTHER): Payer: No Typology Code available for payment source | Admitting: Internal Medicine

## 2022-01-14 VITALS — BP 110/62 | HR 75 | Temp 98.7°F | Ht 72.0 in | Wt 296.0 lb

## 2022-01-14 DIAGNOSIS — F988 Other specified behavioral and emotional disorders with onset usually occurring in childhood and adolescence: Secondary | ICD-10-CM | POA: Diagnosis not present

## 2022-01-14 DIAGNOSIS — Z0000 Encounter for general adult medical examination without abnormal findings: Secondary | ICD-10-CM | POA: Diagnosis not present

## 2022-01-14 DIAGNOSIS — E669 Obesity, unspecified: Secondary | ICD-10-CM

## 2022-01-14 DIAGNOSIS — R5382 Chronic fatigue, unspecified: Secondary | ICD-10-CM

## 2022-01-14 DIAGNOSIS — F339 Major depressive disorder, recurrent, unspecified: Secondary | ICD-10-CM

## 2022-01-14 DIAGNOSIS — E1169 Type 2 diabetes mellitus with other specified complication: Secondary | ICD-10-CM

## 2022-01-14 MED ORDER — BUPROPION HCL ER (XL) 300 MG PO TB24
300.0000 mg | ORAL_TABLET | Freq: Every day | ORAL | 3 refills | Status: DC
  Filled 2022-01-14 – 2022-05-14 (×2): qty 90, 90d supply, fill #0
  Filled 2022-08-11: qty 90, 90d supply, fill #1
  Filled 2022-08-19 – 2022-11-17 (×2): qty 90, 90d supply, fill #2

## 2022-01-14 NOTE — Assessment & Plan Note (Addendum)
On Rybelsus Re-start Dexcom

## 2022-01-14 NOTE — Assessment & Plan Note (Signed)
Multifactorial Chronic Options discussed

## 2022-01-14 NOTE — Assessment & Plan Note (Addendum)
  We discussed age appropriate health related issues, including available/recomended screening tests and vaccinations. Labs were ordered to be later reviewed . All questions were answered. We discussed one or more of the following - seat belt use, use of sunscreen/sun exposure exercise, safe sex, fall risk reduction, second hand smoke exposure, firearm use and storage, seat belt use, a need for adhering to healthy diet and exercise. Labs were ordered.  All questions were answered. Health risks of DM, CAD, obesity and depression discussed... Cardiac CT scan for calcium scoring offered 2020, 2022 Colonoscopy 2022

## 2022-01-14 NOTE — Assessment & Plan Note (Signed)
We increased Citalopram to  40 mg/d Increase Wellbutrin XL to 300 mg/d Psychiatry ref was offered

## 2022-01-14 NOTE — Assessment & Plan Note (Addendum)
Chronic Options discussed Strattera offered We increased Citalopram to  40 mg/d Increase Wellbutrin XL to 300 mg/d

## 2022-01-14 NOTE — Progress Notes (Signed)
Subjective:  Patient ID: Victor Byrd, male    DOB: 06/09/1965  Age: 56 y.o. MRN: [id number]  CC: No chief complaint on file.   HPI [name] presents for CFS, depression - worse. We increased Citalopram: may be a little better... C/o ADD sx's, CFS, forgetful - poor focus Well exam   Outpatient Medications Prior to Visit  Medication Sig Dispense Refill   albuterol (VENTOLIN HFA) 108 (90 Base) MCG/ACT inhaler INHALE 2 PUFFS INTO THE LUNGS EVERY 6 HOURS AS NEEDED FOR WHEEZING. 18 g 1   amLODipine (NORVASC) 10 MG tablet Take 1 tablet by mouth daily. 30 tablet 11   aspirin 81 MG tablet Take 81 mg by mouth daily.     atorvastatin (LIPITOR) 10 MG tablet Take 1 tablet by mouth once a day 90 tablet 4   Blood Glucose Monitoring Suppl (FREESTYLE FREEDOM LITE) w/Device KIT Use as directed 1 kit 0   carvedilol (COREG) 6.25 MG tablet Take 1 tablet by mouth 2  times daily with a meal. 180 tablet 3   citalopram (CELEXA) 40 MG tablet Take 1 tablet (40 mg total) by mouth daily. 90 tablet 1   cloNIDine (CATAPRES) 0.2 MG tablet Take 1 tablet (0.2 mg total) by mouth 2 (two) times daily. 60 tablet 11   Continuous Blood Gluc Sensor (DEXCOM G7 SENSOR) MISC Replace q 10 days 3 each 11   Continuous Blood Gluc Sensor (DEXCOM G7 SENSOR) MISC Change sensor every 10 days 3 each 11   doxazosin (CARDURA) 8 MG tablet Take 1/2 to 1 tablet by mouth daily. 90 tablet 1   furosemide (LASIX) 20 MG tablet Take 1 to 2 tablets by mouth once a day as needed 180 tablet 0   Ginkgo Biloba 40 MG TABS Take by mouth.     glimepiride (AMARYL) 4 MG tablet Take 1 tablet (4 mg total) by mouth 2 (two) times daily. 180 tablet 4   glucose blood (ACCU-CHEK GUIDE) test strip Use as directed (Patient not taking: Reported on 10/15/2021) 100 each 10   glucose blood test strip Use as directed (Patient not taking: Reported on 10/15/2021) 100 each 0   insulin glargine-yfgn (SEMGLEE, YFGN,) 100 UNIT/ML Pen Inject 75 units subcutaneous twice a  day (Patient taking differently: 45 Units.) 135 mL 4   Lancets (FREESTYLE) lancets Use as directed (Patient not taking: Reported on 10/15/2021) 100 each 0   magnesium 30 MG tablet Take 30 mg by mouth daily.     metFORMIN (GLUCOPHAGE) 1000 MG tablet Take 1 tablet by mouth 2 times a day 180 tablet 4   Multiple Vitamin (MULTIVITAMIN WITH MINERALS) TABS tablet Take 1 tablet by mouth daily.     Omega-3 Fatty Acids (FISH OIL) 1000 MG CAPS 1 capsule     Semaglutide (RYBELSUS) 7 MG TABS Take 1 tablet by mouth daily with 4 oz of water on an empty stomach 30 minutes before breakfast or other meds 30 tablet 3   silver sulfADIAZINE (SILVADENE) 1 % cream Use 2 times a day as needed 50 g 1   tadalafil (CIALIS) 20 MG tablet 1 tablet as needed     telmisartan-hydrochlorothiazide (MICARDIS HCT) 80-25 MG tablet Take 1 tablet by mouth daily. 90 tablet 3   buPROPion (WELLBUTRIN XL) 300 MG 24 hr tablet Take 1 tablet (300 mg total) by mouth daily. (Patient taking differently: Take 150 mg by mouth daily. Taking 150 mg daily) 90 tablet 3   nortriptyline (PAMELOR) 50 MG capsule TAKE 1  OR 2 CAPSULES BY MOUTH AT BEDTIME (Patient not taking: Reported on 12/10/2021) 180 capsule 3   RYBELSUS 7 MG TABS Take 1 tablet by mouth daily.     No facility-administered medications prior to visit.    ROS: Review of Systems  Constitutional:  Positive for fatigue. Negative for appetite change and unexpected weight change.  HENT:  Negative for congestion, nosebleeds, sneezing, sore throat, trouble swallowing and voice change.   Eyes:  Negative for itching and visual disturbance.  Respiratory:  Positive for shortness of breath. Negative for cough.   Cardiovascular:  Negative for chest pain, palpitations and leg swelling.  Gastrointestinal:  Negative for abdominal distention, blood in stool, diarrhea and nausea.  Genitourinary:  Negative for frequency and hematuria.  Musculoskeletal:  Positive for gait problem. Negative for back pain,  joint swelling and neck pain.  Skin:  Negative for rash.  Neurological:  Negative for dizziness, tremors, speech difficulty and weakness.  Psychiatric/Behavioral:  Positive for dysphoric mood. Negative for agitation, sleep disturbance and suicidal ideas. The patient is nervous/anxious.     Objective:  BP 110/62 (BP Location: Left Arm, Patient Position: Sitting, Cuff Size: Normal)   Pulse 75   Temp 98.7 F (37.1 C) (Oral)   Ht 6' (1.829 m)   Wt 296 lb (134.3 kg)   SpO2 98%   BMI 40.14 kg/m   BP Readings from Last 3 Encounters:  01/14/22 110/62  12/10/21 128/80  10/15/21 140/74    Wt Readings from Last 3 Encounters:  01/14/22 296 lb (134.3 kg)  12/10/21 291 lb 12.8 oz (132.4 kg)  10/15/21 299 lb (135.6 kg)    Physical Exam Constitutional:      General: He is not in acute distress.    Appearance: He is well-developed.     Comments: NAD  Eyes:     Conjunctiva/sclera: Conjunctivae normal.     Pupils: Pupils are equal, round, and reactive to light.  Neck:     Thyroid: No thyromegaly.     Vascular: No JVD.  Cardiovascular:     Rate and Rhythm: Normal rate and regular rhythm.     Heart sounds: Normal heart sounds. No murmur heard.    No friction rub. No gallop.  Pulmonary:     Effort: Pulmonary effort is normal. No respiratory distress.     Breath sounds: Normal breath sounds. No wheezing or rales.  Chest:     Chest wall: No tenderness.  Abdominal:     General: Bowel sounds are normal. There is no distension.     Palpations: Abdomen is soft. There is no mass.     Tenderness: There is no abdominal tenderness. There is no guarding or rebound.  Musculoskeletal:        General: Normal range of motion.     Cervical back: Normal range of motion. Tenderness present.  Lymphadenopathy:     Cervical: No cervical adenopathy.  Skin:    General: Skin is warm and dry.     Findings: No rash.  Neurological:     Mental Status: He is alert and oriented to person, place, and time.      Cranial Nerves: No cranial nerve deficit.     Motor: No abnormal muscle tone.     Coordination: Coordination normal.     Gait: Gait normal.     Deep Tendon Reflexes: Reflexes are normal and symmetric.  Psychiatric:        Behavior: Behavior normal.        Thought  Content: Thought content normal.        Judgment: Judgment normal.     Lab Results  Component Value Date   WBC 9.0 01/11/2022   HGB 12.5 (L) 01/11/2022   HCT 37.8 (L) 01/11/2022   PLT 255.0 01/11/2022   GLUCOSE 133 (H) 01/11/2022   CHOL 162 01/11/2022   TRIG 211.0 (H) 01/11/2022   HDL 39.20 01/11/2022   LDLDIRECT 96.0 01/11/2022   LDLCALC 59 10/16/2020   ALT 27 01/11/2022   AST 18 01/11/2022   NA 139 01/11/2022   K 4.1 01/11/2022   CL 101 01/11/2022   CREATININE 0.82 01/11/2022   BUN 16 01/11/2022   CO2 33 (H) 01/11/2022   TSH 2.13 01/11/2022   PSA 0.64 01/11/2022   HGBA1C 8.6 (H) 01/11/2022   MICROALBUR 3.4 (H) 01/11/2022    VAS US RENAL ARTERY DUPLEX  Result Date: 12/23/2020 ABDOMINAL VISCERAL Patient Name:  GRAYLAND DAISEY  Date of Exam:   12/22/2020 Medical Rec #: [id number]       Accession #:    [id number] Date of Birth: August 10, 1965       Patient Gender: M Patient Age:   5Y Exam Location:  [facility] Procedure:      VAS US RENAL ARTERY DUPLEX Referring Phys: [id number] Ander Slade [place] -------------------------------------------------------------------------------- Indications: Uncontrolled HTN; on several medications to control BP; denies              abdominal pain. High Risk Factors: Hypertension, hyperlipidemia, Diabetes, no history of                    smoking. Limitations: Air/bowel gas and obesity. Performing Technologist: Wilkie Aye RVT  Examination Guidelines: A complete evaluation includes B-mode imaging, spectral Doppler, color Doppler, and power Doppler as needed of all accessible portions of each vessel. Bilateral testing is considered an integral part of a complete examination. Limited examinations for  reoccurring indications may be performed as noted.  Duplex Findings: +--------------------+--------+--------+------+--------+ Mesenteric          PSV cm/sEDV cm/sPlaqueComments +--------------------+--------+--------+------+--------+ Aorta Prox             99      7                   +--------------------+--------+--------+------+--------+ Aorta Distal          105      2                   +--------------------+--------+--------+------+--------+ Celiac Artery Origin  113      24                  +--------------------+--------+--------+------+--------+ SMA Origin            152                          +--------------------+--------+--------+------+--------+  +------------------+--------+--------+-------+ Right Renal ArteryPSV cm/sEDV cm/sComment +------------------+--------+--------+-------+ Origin               61      21           +------------------+--------+--------+-------+ Proximal             53      12           +------------------+--------+--------+-------+ Mid                 134      29           +------------------+--------+--------+-------+  Distal              121      30           +------------------+--------+--------+-------+ +-----------------+--------+--------+-------+ Left Renal ArteryPSV cm/sEDV cm/sComment +-----------------+--------+--------+-------+ Origin              62      16           +-----------------+--------+--------+-------+ Proximal           123      22           +-----------------+--------+--------+-------+ Mid                142      34           +-----------------+--------+--------+-------+ Distal             103      30           +-----------------+--------+--------+-------+ +------------+--------+--------+----+-----------+--------+--------+----+ Right KidneyPSV cm/sEDV cm/sRI  Left KidneyPSV cm/sEDV cm/sRI   +------------+--------+--------+----+-----------+--------+--------+----+ Upper  Pole  46      15      0.67Upper Pole 83      29      0.65 +------------+--------+--------+----+-----------+--------+--------+----+ Mid         30      10      0.66Mid        25      8       0.66 +------------+--------+--------+----+-----------+--------+--------+----+ Lower Pole  39      13      0.68Lower Pole 26      9       0.63 +------------+--------+--------+----+-----------+--------+--------+----+ Hilar       66      20      0.70Hilar      52      11      0.79 +------------+--------+--------+----+-----------+--------+--------+----+ +------------------+-------+------------------+-------+ Right Kidney             Left Kidney               +------------------+-------+------------------+-------+ RAR (manual)      1.4    RAR (manual)      1.4     +------------------+-------+------------------+-------+ Cortex thickness  7.50 mmCorex thickness   9.80 mm +------------------+-------+------------------+-------+ Kidney length (cm)13.90  Kidney length (cm)13.90   +------------------+-------+------------------+-------+  Summary: Renal:  Right: Normal size right kidney. Normal right Resisitive Index.        Normal cortical thickness of right kidney. No evidence of        right renal artery stenosis. RRV flow present. Left:  Normal size of left kidney. Normal left Resistive Index.        Normal cortical thickness of the left kidney. No evidence of        left renal artery stenosis. LRV flow present. Mesenteric: Normal Superior Mesenteric artery and Celiac artery findings.  *See table(s) above for measurements and observations.  Diagnosing physician: Larae Grooms MD  Electronically signed by Larae Grooms MD on 12/23/2020 at 1:13:09 PM.    Final     Assessment & Plan:   Problem List Items Addressed This Visit     ADD (attention deficit disorder)    Chronic Options discussed Strattera offered We increased Citalopram to  40 mg/d Increase Wellbutrin XL to  300 mg/d      Chronic fatigue    Multifactorial Chronic Options discussed      Depression, major    We increased Citalopram to  40 mg/d Increase Wellbutrin XL to 300 mg/d Psychiatry ref was offered      Relevant Medications   buPROPion (WELLBUTRIN XL) 300 MG 24 hr tablet   Diabetes mellitus type 2 in obese (HCC)    On Rybelsus Re-start Dexcom       Well adult exam - Primary     We discussed age appropriate health related issues, including available/recomended screening tests and vaccinations. Labs were ordered to be later reviewed . All questions were answered. We discussed one or more of the following - seat belt use, use of sunscreen/sun exposure exercise, safe sex, fall risk reduction, second hand smoke exposure, firearm use and storage, seat belt use, a need for adhering to healthy diet and exercise. Labs were ordered.  All questions were answered. Health risks of DM, CAD, obesity and depression discussed... Cardiac CT scan for calcium scoring offered 2020, 2022 Colonoscopy 2022          Meds ordered this encounter  Medications   buPROPion (WELLBUTRIN XL) 300 MG 24 hr tablet    Sig: Take 1 tablet (300 mg total) by mouth daily.    Dispense:  90 tablet    Refill:  3      Follow-up: Return in about 3 months (around 04/16/2022) for a follow-up visit.  Walker Kehr, MD

## 2022-01-16 ENCOUNTER — Other Ambulatory Visit: Payer: Self-pay | Admitting: Internal Medicine

## 2022-01-17 ENCOUNTER — Other Ambulatory Visit (HOSPITAL_COMMUNITY): Payer: Self-pay

## 2022-01-17 MED ORDER — TADALAFIL 20 MG PO TABS
20.0000 mg | ORAL_TABLET | Freq: Every day | ORAL | 3 refills | Status: DC | PRN
  Filled 2022-01-17: qty 10, 10d supply, fill #0
  Filled 2022-02-13: qty 10, 10d supply, fill #1
  Filled 2022-03-02: qty 10, 10d supply, fill #2

## 2022-01-18 ENCOUNTER — Ambulatory Visit: Payer: No Typology Code available for payment source

## 2022-01-18 DIAGNOSIS — G4733 Obstructive sleep apnea (adult) (pediatric): Secondary | ICD-10-CM

## 2022-01-25 ENCOUNTER — Other Ambulatory Visit (HOSPITAL_COMMUNITY): Payer: Self-pay

## 2022-01-26 ENCOUNTER — Other Ambulatory Visit (HOSPITAL_COMMUNITY): Payer: Self-pay

## 2022-01-26 MED ORDER — RYBELSUS 7 MG PO TABS
ORAL_TABLET | ORAL | 11 refills | Status: AC
  Filled 2022-01-26: qty 30, 30d supply, fill #0
  Filled 2022-03-18: qty 30, 30d supply, fill #1
  Filled 2022-05-14: qty 30, 30d supply, fill #2
  Filled 2022-06-05 – 2022-06-08 (×3): qty 30, 30d supply, fill #3

## 2022-01-28 ENCOUNTER — Other Ambulatory Visit (HOSPITAL_COMMUNITY): Payer: Self-pay

## 2022-01-28 ENCOUNTER — Telehealth: Payer: Self-pay | Admitting: Internal Medicine

## 2022-01-28 MED ORDER — RYBELSUS 7 MG PO TABS
ORAL_TABLET | ORAL | 4 refills | Status: DC
  Filled 2022-01-28: qty 90, 90d supply, fill #0

## 2022-01-28 NOTE — Telephone Encounter (Signed)
Patient would like a copy of his latest lab results sent to Dr. Chalmers Cater [facility] .  Please fax to her office -   Phone # [phone]

## 2022-01-28 NOTE — Telephone Encounter (Signed)
Faxed cpx labs to Dr. Chalmers Cater.Marland KitchenJohny Chess

## 2022-01-29 ENCOUNTER — Other Ambulatory Visit (HOSPITAL_COMMUNITY): Payer: Self-pay

## 2022-01-29 MED ORDER — GLUCOSE BLOOD VI STRP
ORAL_STRIP | 6 refills | Status: AC
  Filled 2022-01-29: qty 100, 33d supply, fill #0
  Filled 2022-02-01: qty 100, 30d supply, fill #0

## 2022-01-31 ENCOUNTER — Telehealth: Payer: Self-pay | Admitting: Pulmonary Disease

## 2022-01-31 ENCOUNTER — Other Ambulatory Visit (HOSPITAL_COMMUNITY): Payer: Self-pay

## 2022-01-31 DIAGNOSIS — G4733 Obstructive sleep apnea (adult) (pediatric): Secondary | ICD-10-CM | POA: Diagnosis not present

## 2022-01-31 NOTE — Telephone Encounter (Signed)
HST showed mild  OSA with AHI 13/ hr - let pt know please Rx for autoCPAP was already sent to DME  - see last phone note.  Please send copy of study & ensure that DME has order please

## 2022-02-01 ENCOUNTER — Other Ambulatory Visit (HOSPITAL_COMMUNITY): Payer: Self-pay

## 2022-02-01 MED ORDER — FREESTYLE LANCETS MISC
6 refills | Status: AC
  Filled 2022-02-01: qty 100, 30d supply, fill #0

## 2022-02-01 MED ORDER — INSULIN GLARGINE-YFGN 100 UNIT/ML ~~LOC~~ SOPN
PEN_INJECTOR | SUBCUTANEOUS | 3 refills | Status: AC
  Filled 2022-02-01: qty 45, 75d supply, fill #0
  Filled 2022-04-19: qty 45, 75d supply, fill #1
  Filled 2022-07-04: qty 45, 75d supply, fill #2
  Filled 2022-07-05: qty 30, 50d supply, fill #2
  Filled 2022-07-05: qty 15, 25d supply, fill #2
  Filled 2022-07-05: qty 45, 75d supply, fill #2
  Filled 2022-09-02: qty 45, 75d supply, fill #3

## 2022-02-01 MED ORDER — FREESTYLE LITE W/DEVICE KIT
PACK | 0 refills | Status: DC
  Filled 2022-02-01: qty 1, 30d supply, fill #0

## 2022-02-04 ENCOUNTER — Other Ambulatory Visit (HOSPITAL_COMMUNITY): Payer: Self-pay

## 2022-02-05 ENCOUNTER — Other Ambulatory Visit (HOSPITAL_COMMUNITY): Payer: Self-pay

## 2022-02-05 NOTE — Progress Notes (Signed)
Cardiology Office Note:    Date:  02/07/2022   ID:  Victor Byrd, DOB [birth year], MRN [id number]  PCP:  Cassandria Anger, MD   [facility] Providers Cardiologist:  Cierra Rothgeb Martinique, MD Cardiology APP:  Ledora Bottcher, PA     Referring MD: Cassandria Anger, MD   Chief Complaint  Patient presents with   Hypertension    History of Present Illness:    Victor Byrd is a 56 y.o. male with a hx of severe HTN and elevated coronary calcium score, morbid obesity, OSA, HTN, HLD, and DM2. Renal dopplers negative for RAS. Stress echo in 2013 was normal. Calcium score in 10/2020 was 161 placing him in the 88th percentile. He was last seen by Dr. Martinique 03/19/21 and was asymptomatic. He was planning evaluation with [facility] [place] [facility] for bariatric surgery.  He was seen on 10/05/2021 with hypertensive urgency and documented home BPs of 205/101.  His log presented BPs taken 10 to 15 minutes after medications.  His work schedule did not allow blood pressure log at other times.  He was very frustrated because he had reduced soda and sodium intake.  I opted to add carvedilol 6.25 mg twice daily given a heart rate of 91 in the office.  On recheck in the office his BP was 126/80.   On subsequent follow up since June his BP control has been good. He does complain of excessive fatigue on exertion. Has some dyspnea but no chest pain. Had sleep study recently showing mild to moderate OSA - Auto- titration CPAP trial ordered by Dr Elsworth Soho.   He wears a knee brace on the left for bone on bone - needs a knee replacement.    Past Medical History:  Diagnosis Date   Anxiety    Asthma    Depression    Diabetes mellitus type II    Hyperlipidemia    Hypertension    Hyperthyroidism    Pt denies thyroid issues   OSA on CPAP    Dr. Chalmers Cater   Restless legs    Sleep apnea    cpap    Past Surgical History:  Procedure Laterality Date   COLONOSCOPY  11/01/2020   Marshallberg Cellar, MD   [place]  2007   WISDOM TOOTH EXTRACTION  1992    Current Medications: Current Meds  Medication Sig   albuterol (VENTOLIN HFA) 108 (90 Base) MCG/ACT inhaler INHALE 2 PUFFS INTO THE LUNGS EVERY 6 HOURS AS NEEDED FOR WHEEZING.   amLODipine (NORVASC) 10 MG tablet Take 1 tablet by mouth daily.   aspirin 81 MG tablet Take 81 mg by mouth daily.   atorvastatin (LIPITOR) 10 MG tablet Take 1 tablet by mouth once a day   Blood Glucose Monitoring Suppl (FREESTYLE FREEDOM LITE) w/Device KIT Use as directed   Blood Glucose Monitoring Suppl (FREESTYLE LITE) w/Device KIT Use to test blood sugar up to three times a day   buPROPion (WELLBUTRIN XL) 300 MG 24 hr tablet Take 1 tablet (300 mg total) by mouth daily.   carvedilol (COREG) 6.25 MG tablet Take 1 tablet by mouth 2  times daily with a meal.   citalopram (CELEXA) 40 MG tablet Take 1 tablet (40 mg total) by mouth daily.   cloNIDine (CATAPRES) 0.2 MG tablet Take 1 tablet (0.2 mg total) by mouth 2 (two) times daily.   Continuous Blood Gluc Sensor (DEXCOM G7 SENSOR) MISC Replace q 10 days  Continuous Blood Gluc Sensor (DEXCOM G7 SENSOR) MISC Change sensor every 10 days   doxazosin (CARDURA) 8 MG tablet Take 1/2 to 1 tablet by mouth daily.   furosemide (LASIX) 20 MG tablet Take 1 to 2 tablets by mouth once a day as needed   Ginkgo Biloba 40 MG TABS Take by mouth.   glimepiride (AMARYL) 4 MG tablet Take 1 tablet (4 mg total) by mouth 2 (two) times daily.   glucose blood (ACCU-CHEK GUIDE) test strip Use as directed   glucose blood test strip Use as directed   glucose blood test strip Use to test blood sugar up to three times daily as needed.   insulin glargine-yfgn (SEMGLEE, YFGN,) 100 UNIT/ML Pen Inject 75 units subcutaneous twice a day (Patient taking differently: 45 Units.)   insulin glargine-yfgn (SEMGLEE, YFGN,) 100 UNIT/ML Pen Inject 30 units under the skin twice a day   Lancets (FREESTYLE) lancets Use as directed   Lancets  (FREESTYLE) lancets Use as directed to test blood sugar up to 3 times a day as needed.   magnesium 30 MG tablet Take 30 mg by mouth daily.   metFORMIN (GLUCOPHAGE) 1000 MG tablet Take 1 tablet by mouth 2 times a day   Multiple Vitamin (MULTIVITAMIN WITH MINERALS) TABS tablet Take 1 tablet by mouth daily.   Omega-3 Fatty Acids (FISH OIL) 1000 MG CAPS 1 capsule   Semaglutide (RYBELSUS) 7 MG TABS Take 1 tablet by mouth daily with 4 oz of water on an empty stomach 30 minutes before breakfast or other meds   Semaglutide (RYBELSUS) 7 MG TABS Take 1 tablet by mouth daily with 4 oz of water on an empty stomach 30 minutes before breakfast or other meds   silver sulfADIAZINE (SILVADENE) 1 % cream Use 2 times a day as needed   tadalafil (CIALIS) 20 MG tablet Take 1 tablet (20 mg total) by mouth daily as needed for erectile dysfunction.   telmisartan-hydrochlorothiazide (MICARDIS HCT) 80-25 MG tablet Take 1 tablet by mouth daily.     Allergies:   Dapagliflozin and Ramipril   Social History   Socioeconomic History   Marital status: Married    Spouse name: Not on file   Number of children: 2   Years of education: Not on file   Highest education level: Not on file  Occupational History   Occupation: Public house manager: UNEMPLOYED    Comment: out of work  Tobacco Use   Smoking status: Never   Smokeless tobacco: Never  Vaping Use   Vaping Use: Never used  Substance and Sexual Activity   Alcohol use: Yes    Alcohol/week: 0.0 standard drinks of alcohol   Drug use: No   Sexual activity: Yes  Other Topics Concern   Not on file  Social History Narrative   Not on file   Social Determinants of Health   Financial Resource Strain: Not on file  Food Insecurity: Not on file  Transportation Needs: Not on file  Physical Activity: Not on file  Stress: Not on file  Social Connections: Not on file     Family History: The patient's family history includes Colon polyps in his father; Diabetes in  his mother and another family member; Heart attack in his father; Heart disease in his father; Hyperlipidemia in an other family member; Hypertension in his father and another family member; Lung cancer in an other family member. There is no history of Colon cancer, Esophageal cancer, Rectal cancer, or Stomach cancer.  ROS:   Please see the history of present illness.     All other systems reviewed and are negative.  EKGs/Labs/Other Studies Reviewed:    The following studies were reviewed today:  CT calcium score 10/27/20:  IMPRESSION: Coronary calcium score of 161. This was 88th percentile for age-, race-, and sex-matched controls.  EKG:  EKG is not ordered today.    Recent Labs: 01/11/2022: ALT 27; BUN 16; Creatinine, Ser 0.82; Hemoglobin 12.5; Platelets 255.0; Potassium 4.1; Sodium 139; TSH 2.13  Recent Lipid Panel    Component Value Date/Time   CHOL 162 01/11/2022 0958   TRIG 211.0 (H) 01/11/2022 0958   HDL 39.20 01/11/2022 0958   CHOLHDL 4 01/11/2022 0958   VLDL 42.2 (H) 01/11/2022 0958   LDLCALC 59 10/16/2020 1449   LDLDIRECT 96.0 01/11/2022 0958     Risk Assessment/Calculations:           Physical Exam:    VS:  BP (!) 146/68   Pulse 82   Ht 5' 11"  (1.803 m)   Wt 292 lb 9.6 oz (132.7 kg)   SpO2 96%   BMI 40.81 kg/m     Wt Readings from Last 3 Encounters:  02/07/22 292 lb 9.6 oz (132.7 kg)  01/14/22 296 lb (134.3 kg)  12/10/21 291 lb 12.8 oz (132.4 kg)     GEN: morbidly obese male in NAD HEENT: Normal NECK: No JVD; No carotid bruits LYMPHATICS: No lymphadenopathy CARDIAC: RRR, no murmurs, rubs, gallops RESPIRATORY:  scattered wheezing ABDOMEN: Soft, non-tender, non-distended MUSCULOSKELETAL:  mild B LE edema; No deformity  SKIN: Warm and dry NEUROLOGIC:  Alert and oriented x 3 PSYCHIATRIC:  Normal affect   ASSESSMENT:    1. Coronary artery calcification   2. OSA (obstructive sleep apnea)   3. Hypercholesteremia   4. Primary hypertension   5.  Fatigue due to excessive exertion, initial encounter   6. Dyspnea on exertion     PLAN:    In order of problems listed above:  Hx of poorly controlled HTN - no evidence RAS - maintained on norvasc, clonidine, telmisartan-HCTZ, cardura, and carvedilol - Pressure has done well over the past 3 months - may improve with Rx of sleep apnea and weight loss.    Hyperlipidemia with LDL goal < 70 Most recent panel showed increase in LDL to 96.  - will increase lipitor to 20 mg daily.  - repeat fasting lab in 3 months   Coronary artery calcifications Aortic atherosclerosis Elevated coronary calcium score Continue risk factor modification Continue ASA Patient has significant exertional fatigue and dyspnea- possible anginal equivalent. Last stress test was 10 years ago. Will arrange for [place] PET study.    Morbid obesity with OSA - per Dr Elsworth Soho - Autotitration trial CPAP   Follow up post PET   Medication Adjustments/Labs and Tests Ordered: Current medicines are reviewed at length with the patient today.  Concerns regarding medicines are outlined above.  Orders Placed This Encounter  Procedures   NM PET CT CARDIAC PERFUSION MULTI W/ABSOLUTE BLOODFLOW   No orders of the defined types were placed in this encounter.   There are no Patient Instructions on file for this visit.   Signed, Shaqueta Casady Martinique, MD  02/07/2022 4:53 PM    [facility]

## 2022-02-07 ENCOUNTER — Other Ambulatory Visit (HOSPITAL_COMMUNITY): Payer: Self-pay

## 2022-02-07 ENCOUNTER — Encounter: Payer: Self-pay | Admitting: Cardiology

## 2022-02-07 ENCOUNTER — Ambulatory Visit: Payer: No Typology Code available for payment source | Attending: Cardiology | Admitting: Cardiology

## 2022-02-07 VITALS — BP 146/68 | HR 82 | Ht 71.0 in | Wt 292.6 lb

## 2022-02-07 DIAGNOSIS — R0609 Other forms of dyspnea: Secondary | ICD-10-CM

## 2022-02-07 DIAGNOSIS — T733XXA Exhaustion due to excessive exertion, initial encounter: Secondary | ICD-10-CM

## 2022-02-07 DIAGNOSIS — G4733 Obstructive sleep apnea (adult) (pediatric): Secondary | ICD-10-CM

## 2022-02-07 DIAGNOSIS — I2510 Atherosclerotic heart disease of native coronary artery without angina pectoris: Secondary | ICD-10-CM | POA: Diagnosis not present

## 2022-02-07 DIAGNOSIS — I2584 Coronary atherosclerosis due to calcified coronary lesion: Secondary | ICD-10-CM

## 2022-02-07 DIAGNOSIS — I10 Essential (primary) hypertension: Secondary | ICD-10-CM | POA: Diagnosis not present

## 2022-02-07 DIAGNOSIS — E7800 Pure hypercholesterolemia, unspecified: Secondary | ICD-10-CM | POA: Diagnosis not present

## 2022-02-07 MED ORDER — ATORVASTATIN CALCIUM 20 MG PO TABS
20.0000 mg | ORAL_TABLET | Freq: Every day | ORAL | 3 refills | Status: DC
  Filled 2022-02-07: qty 90, 90d supply, fill #0
  Filled 2022-05-05: qty 90, 90d supply, fill #1
  Filled 2022-08-02 – 2022-08-03 (×2): qty 90, 90d supply, fill #2
  Filled 2022-10-31: qty 90, 90d supply, fill #3

## 2022-02-07 NOTE — Patient Instructions (Addendum)
Medication Instructions:  Increase Atorvastatin to 20 mg daily Continue all other medications *If you need a refill on your cardiac medications before your next appointment, please call your pharmacy*   Lab Work: Lipid and Hepatic panels in 3 months   Lab order enclosed   Testing/Procedures: Cardiac Pet Scan will be scheduled after approved by insurance  Follow instructions below   Follow-Up: At [facility], you and your health needs are our priority.  As part of our continuing mission to provide you with exceptional heart care, we have created designated Provider Care Teams.  These Care Teams include your primary Cardiologist (physician) and Advanced Practice Providers (APPs -  Physician Assistants and Nurse Practitioners) who all work together to provide you with the care you need, when you need it.  We recommend signing up for the patient portal called "MyChart".  Sign up information is provided on this After Visit Summary.  MyChart is used to connect with patients for Virtual Visits (Telemedicine).  Patients are able to view lab/test results, encounter notes, upcoming appointments, etc.  Non-urgent messages can be sent to your provider as well.   To learn more about what you can do with MyChart, go to [email or web address].    Your next appointment:  After Test    The format for your next appointment: Office    Provider:  Dr.Jordan   How to Prepare for Your Cardiac PET/CT Stress Test:  1. Please do not take these medications before your test:   Medications that may interfere with the cardiac pharmacological stress agent (ex. nitrates - including erectile dysfunction medications or beta-blockers) the day of the exam. (Erectile dysfunction medication should be held for at least 72 hrs prior to test) Theophylline containing medications for 12 hours. Dipyridamole 48 hours prior to the test. Your remaining medications may be taken with water.  2. Nothing to eat or  drink, except water, 3 hours prior to arrival time.   NO caffeine/decaffeinated products, or chocolate 12 hours prior to arrival.  3. NO perfume, cologne or lotion  4. Total time is 1 to 2 hours; you may want to bring reading material for the waiting time.  5. Please report to Admitting at the [facility] Main Entrance 60 minutes early for your test.  Victor Byrd, Victor Byrd [id number]  Diabetic Preparation:  Hold oral medications. You may take NPH and Lantus insulin. Do not take Humalog or Humulin R (Regular Insulin) the day of your test. Check blood sugars prior to leaving the house. If able to eat breakfast prior to 3 hour fasting, you may take all medications, including your insulin, Do not worry if you miss your breakfast dose of insulin - start at your next meal.  IF YOU THINK YOU MAY BE PREGNANT, OR ARE NURSING PLEASE INFORM THE TECHNOLOGIST.  In preparation for your appointment, medication and supplies will be purchased.  Appointment availability is limited, so if you need to cancel or reschedule, please call the Radiology Department at [phone]  24 hours in advance to avoid a cancellation fee of $100.00  What to Expect After you Arrive:  Once you arrive and check in for your appointment, you will be taken to a preparation room within the Radiology Department.  A technologist or Nurse will obtain your medical history, verify that you are correctly prepped for the exam, and explain the procedure.  Afterwards,  an IV will be started in your arm and electrodes will be placed on your  skin for EKG monitoring during the stress portion of the exam. Then you will be escorted to the PET/CT scanner.  There, staff will get you positioned on the scanner and obtain a blood pressure and EKG.  During the exam, you will continue to be connected to the EKG and blood pressure machines.  A small, safe amount of a radioactive tracer will be injected in your IV to obtain a series  of pictures of your heart along with an injection of a stress agent.    After your Exam:  It is recommended that you eat a meal and drink a caffeinated beverage to counter act any effects of the stress agent.  Drink plenty of fluids for the remainder of the day and urinate frequently for the first couple of hours after the exam.  Your doctor will inform you of your test results within 7-10 business days.  For questions about your test or how to prepare for your test, please call: Marchia Bond, Cardiac Imaging Nurse Navigator  Gordy Clement, Cardiac Imaging Nurse Navigator Office: [phone]   Important Information About Sugar

## 2022-02-14 ENCOUNTER — Other Ambulatory Visit (HOSPITAL_COMMUNITY): Payer: Self-pay

## 2022-02-20 ENCOUNTER — Other Ambulatory Visit (HOSPITAL_COMMUNITY): Payer: Self-pay

## 2022-02-20 ENCOUNTER — Other Ambulatory Visit: Payer: Self-pay | Admitting: Internal Medicine

## 2022-02-21 ENCOUNTER — Other Ambulatory Visit (HOSPITAL_COMMUNITY): Payer: Self-pay

## 2022-02-22 ENCOUNTER — Other Ambulatory Visit (HOSPITAL_COMMUNITY): Payer: Self-pay

## 2022-02-22 MED ORDER — DEXCOM G7 SENSOR MISC
6 refills | Status: AC
  Filled 2022-02-22: qty 3, 30d supply, fill #0
  Filled 2022-04-10: qty 3, 30d supply, fill #1
  Filled 2022-05-02 – 2022-05-13 (×2): qty 3, 30d supply, fill #2
  Filled 2022-06-13: qty 3, 30d supply, fill #3
  Filled 2022-07-13: qty 3, 30d supply, fill #4
  Filled 2022-08-15: qty 3, 30d supply, fill #5
  Filled 2022-09-07: qty 3, 30d supply, fill #6

## 2022-02-25 ENCOUNTER — Other Ambulatory Visit (HOSPITAL_COMMUNITY): Payer: Self-pay

## 2022-03-02 ENCOUNTER — Other Ambulatory Visit (HOSPITAL_COMMUNITY): Payer: Self-pay

## 2022-03-04 ENCOUNTER — Other Ambulatory Visit (HOSPITAL_COMMUNITY): Payer: Self-pay

## 2022-03-07 ENCOUNTER — Other Ambulatory Visit (HOSPITAL_COMMUNITY): Payer: Self-pay

## 2022-03-10 ENCOUNTER — Other Ambulatory Visit: Payer: Self-pay | Admitting: Internal Medicine

## 2022-03-11 ENCOUNTER — Other Ambulatory Visit (HOSPITAL_COMMUNITY): Payer: Self-pay

## 2022-03-11 MED ORDER — FUROSEMIDE 20 MG PO TABS
ORAL_TABLET | ORAL | 0 refills | Status: DC
  Filled 2022-03-11: qty 180, 90d supply, fill #0

## 2022-03-13 ENCOUNTER — Other Ambulatory Visit (HOSPITAL_COMMUNITY): Payer: Self-pay

## 2022-03-14 ENCOUNTER — Ambulatory Visit (INDEPENDENT_AMBULATORY_CARE_PROVIDER_SITE_OTHER): Payer: No Typology Code available for payment source | Admitting: Pulmonary Disease

## 2022-03-14 ENCOUNTER — Encounter: Payer: Self-pay | Admitting: Pulmonary Disease

## 2022-03-14 DIAGNOSIS — G4733 Obstructive sleep apnea (adult) (pediatric): Secondary | ICD-10-CM

## 2022-03-14 NOTE — Assessment & Plan Note (Signed)
Although home sleep test only showed mild OSA, I this is an underestimate.  His previous study showed moderate OSA, which is more in accordance with his symptoms. We will send in prescription for auto CPAP 5 to 15 cm with nasal pillows and chinstrap to DME.  For some reason the previous order has not been picked up and he has not received his CPAP yet. We will assess download and tweak settings as needed  Weight loss encouraged, compliance with goal of at least 4-6 hrs every night is the expectation. Advised against medications with sedative side effects Cautioned against driving when sleepy - understanding that sleepiness will vary on a day to day basis

## 2022-03-14 NOTE — Progress Notes (Signed)
   Subjective:    Patient ID: Victor Byrd, male    DOB: Oct 15, 1965, 56 y.o.   MRN: [id number]  HPI  56 year old for follow-up of OSA  PMH -insulin requiring diabetes Hypertension   Initial office visit 12/2021 he has been on CPAP since 2007.  We repeated home sleep test and sent an order for auto CPAP replacement However for some reason, he has not received CPAP yet.  He is using his old machine with nasal pillows, he reports mouth dryness and would like a chinstrap We reviewed home sleep test in detail today   Significant tests/ events reviewed  01/2022 HST showed mild  OSA with AHI 13/ hr , low sat 87%    NPSG 07/2005 AHI 25/hour, lowest desaturation 82% , corrected by CPAP 8 cm with nasal pillows   Review of Systems neg for any significant sore throat, dysphagia, itching, sneezing, nasal congestion or excess/ purulent secretions, fever, chills, sweats, unintended wt loss, pleuritic or exertional cp, hempoptysis, orthopnea pnd or change in chronic leg swelling. Also denies presyncope, palpitations, heartburn, abdominal pain, nausea, vomiting, diarrhea or change in bowel or urinary habits, dysuria,hematuria, rash, arthralgias, visual complaints, headache, numbness weakness or ataxia.     Objective:   Physical Exam  Gen. Pleasant, obese, in no distress ENT - no lesions, no post nasal drip Neck: No JVD, no thyromegaly, no carotid bruits Lungs: no use of accessory muscles, no dullness to percussion, decreased without rales or rhonchi  Cardiovascular: Rhythm regular, heart sounds  normal, no murmurs or gallops, no peripheral edema Musculoskeletal: No deformities, no cyanosis or clubbing , no tremors       Assessment & Plan:

## 2022-03-14 NOTE — Patient Instructions (Signed)
X rx for auto CPAP 5 to 15 cm with nasal pillows and chinstrap to be sent to [facility]  Please let us know if you have not heard from DME in 1 week

## 2022-03-17 ENCOUNTER — Other Ambulatory Visit (HOSPITAL_COMMUNITY): Payer: Self-pay

## 2022-03-18 ENCOUNTER — Other Ambulatory Visit (HOSPITAL_COMMUNITY): Payer: Self-pay

## 2022-03-18 MED ORDER — GLIMEPIRIDE 4 MG PO TABS
4.0000 mg | ORAL_TABLET | Freq: Two times a day (BID) | ORAL | 4 refills | Status: DC
  Filled 2022-03-18: qty 180, 90d supply, fill #0
  Filled 2022-06-14: qty 180, 90d supply, fill #1
  Filled 2022-09-12: qty 180, 90d supply, fill #2
  Filled 2022-12-11 – 2022-12-12 (×2): qty 180, 90d supply, fill #3

## 2022-03-20 ENCOUNTER — Encounter: Payer: Self-pay | Admitting: Internal Medicine

## 2022-04-03 ENCOUNTER — Other Ambulatory Visit (HOSPITAL_COMMUNITY): Payer: Self-pay

## 2022-04-09 ENCOUNTER — Encounter: Payer: Self-pay | Admitting: Internal Medicine

## 2022-04-09 ENCOUNTER — Ambulatory Visit (INDEPENDENT_AMBULATORY_CARE_PROVIDER_SITE_OTHER): Payer: No Typology Code available for payment source | Admitting: Internal Medicine

## 2022-04-09 VITALS — BP 138/90 | HR 70 | Temp 98.2°F | Ht 72.0 in | Wt 283.4 lb

## 2022-04-09 DIAGNOSIS — E291 Testicular hypofunction: Secondary | ICD-10-CM | POA: Diagnosis not present

## 2022-04-09 DIAGNOSIS — F419 Anxiety disorder, unspecified: Secondary | ICD-10-CM | POA: Diagnosis not present

## 2022-04-09 DIAGNOSIS — E785 Hyperlipidemia, unspecified: Secondary | ICD-10-CM

## 2022-04-09 MED ORDER — CITALOPRAM HYDROBROMIDE 40 MG PO TABS: 20.0000 mg | ORAL_TABLET | Freq: Every day | ORAL | 1 refills | Status: DC

## 2022-04-09 NOTE — Progress Notes (Signed)
Subjective:  Patient ID: Victor Byrd, male    DOB: 06-12-1965  Age: 56 y.o. MRN: [id number]  CC: testosterone (Want testosterone level check)   HPI Victor Byrd presents for low libido, ED, hypogonadism  Outpatient Medications Prior to Visit  Medication Sig Dispense Refill   albuterol (VENTOLIN HFA) 108 (90 Base) MCG/ACT inhaler INHALE 2 PUFFS INTO THE LUNGS EVERY 6 HOURS AS NEEDED FOR WHEEZING. 18 g 1   amLODipine (NORVASC) 10 MG tablet Take 1 tablet by mouth daily. 30 tablet 11   aspirin 81 MG tablet Take 81 mg by mouth daily.     atorvastatin (LIPITOR) 20 MG tablet Take 1 tablet (20 mg total) by mouth daily. 90 tablet 3   buPROPion (WELLBUTRIN XL) 300 MG 24 hr tablet Take 1 tablet (300 mg total) by mouth daily. 90 tablet 3   carvedilol (COREG) 6.25 MG tablet Take 1 tablet by mouth 2  times daily with a meal. 180 tablet 3   cloNIDine (CATAPRES) 0.2 MG tablet Take 1 tablet (0.2 mg total) by mouth 2 (two) times daily. 60 tablet 11   Continuous Blood Gluc Sensor (DEXCOM G7 SENSOR) MISC Replace q 10 days 3 each 11   Continuous Blood Gluc Sensor (DEXCOM G7 SENSOR) MISC Change sensor every 10 days 3 each 11   Continuous Blood Gluc Sensor (DEXCOM G7 SENSOR) MISC Use as directed every 10 days 3 each 6   doxazosin (CARDURA) 8 MG tablet Take 1/2 to 1 tablet by mouth daily. 90 tablet 1   furosemide (LASIX) 20 MG tablet Take 1 to 2 tablets by mouth once a day as needed 180 tablet 0   Ginkgo Biloba 40 MG TABS Take by mouth.     glimepiride (AMARYL) 4 MG tablet Take 1 tablet (4 mg total) by mouth 2 (two) times daily. 180 tablet 4   glucose blood test strip Use to test blood sugar up to three times daily as needed. 100 each 6   insulin glargine-yfgn (SEMGLEE, YFGN,) 100 UNIT/ML Pen Inject 30 units under the skin twice a day 45 mL 3   Lancets (FREESTYLE) lancets Use as directed to test blood sugar up to 3 times a day as needed. 100 each 6   magnesium 30 MG tablet Take 30 mg by mouth daily.      metFORMIN (GLUCOPHAGE) 1000 MG tablet Take 1 tablet by mouth 2 times a day 180 tablet 4   Multiple Vitamin (MULTIVITAMIN WITH MINERALS) TABS tablet Take 1 tablet by mouth daily.     Omega-3 Fatty Acids (FISH OIL) 1000 MG CAPS 1 capsule     Semaglutide (RYBELSUS) 7 MG TABS Take 1 tablet by mouth daily with 4 oz of water on an empty stomach 30 minutes before breakfast or other meds 30 tablet 11   tadalafil (CIALIS) 20 MG tablet Take 1 tablet (20 mg total) by mouth daily as needed for erectile dysfunction. 10 tablet 3   telmisartan-hydrochlorothiazide (MICARDIS HCT) 80-25 MG tablet Take 1 tablet by mouth daily. 90 tablet 3   citalopram (CELEXA) 40 MG tablet Take 1 tablet (40 mg total) by mouth daily. 90 tablet 1   Blood Glucose Monitoring Suppl (FREESTYLE FREEDOM LITE) w/Device KIT Use as directed (Patient not taking: Reported on 04/09/2022) 1 kit 0   Blood Glucose Monitoring Suppl (FREESTYLE LITE) w/Device KIT Use to test blood sugar up to three times a day (Patient not taking: Reported on 04/09/2022) 1 kit 0   glucose blood (ACCU-CHEK GUIDE)  test strip Use as directed (Patient not taking: Reported on 04/09/2022) 100 each 10   glucose blood test strip Use as directed (Patient not taking: Reported on 04/09/2022) 100 each 0   insulin glargine-yfgn (SEMGLEE, YFGN,) 100 UNIT/ML Pen Inject 75 units subcutaneous twice a day (Patient not taking: Reported on 04/09/2022) 135 mL 4   Lancets (FREESTYLE) lancets Use as directed (Patient not taking: Reported on 04/09/2022) 100 each 0   Semaglutide (RYBELSUS) 7 MG TABS Take 1 tablet by mouth daily with 4 oz of water on an empty stomach 30 minutes before breakfast or other meds (Patient not taking: Reported on 04/09/2022) 90 tablet 4   silver sulfADIAZINE (SILVADENE) 1 % cream Use 2 times a day as needed (Patient not taking: Reported on 04/09/2022) 50 g 1   No facility-administered medications prior to visit.    ROS: Review of Systems  Constitutional:  Positive for  fatigue. Negative for appetite change and unexpected weight change.  HENT:  Negative for congestion, nosebleeds, sneezing, sore throat and trouble swallowing.   Eyes:  Negative for itching and visual disturbance.  Respiratory:  Negative for cough.   Cardiovascular:  Negative for chest pain, palpitations and leg swelling.  Gastrointestinal:  Negative for abdominal distention, blood in stool, diarrhea and nausea.  Genitourinary:  Negative for frequency and hematuria.  Musculoskeletal:  Negative for back pain, gait problem, joint swelling and neck pain.  Skin:  Negative for rash.  Neurological:  Negative for dizziness, tremors, speech difficulty and weakness.  Psychiatric/Behavioral:  Positive for dysphoric mood. Negative for agitation, self-injury, sleep disturbance and suicidal ideas. The patient is not nervous/anxious.     Objective:  BP (!) 138/90 (BP Location: Left Arm)   Pulse 70   Temp 98.2 F (36.8 C) (Oral)   Ht 6' (1.829 m)   Wt 283 lb 6.4 oz (128.5 kg)   SpO2 95%   BMI 38.44 kg/m   BP Readings from Last 3 Encounters:  04/09/22 (!) 138/90  03/14/22 128/70  02/07/22 (!) 146/68    Wt Readings from Last 3 Encounters:  04/09/22 283 lb 6.4 oz (128.5 kg)  03/14/22 291 lb 9.6 oz (132.3 kg)  02/07/22 292 lb 9.6 oz (132.7 kg)    Physical Exam Constitutional:      General: He is not in acute distress.    Appearance: He is well-developed. He is obese.     Comments: NAD  Eyes:     Conjunctiva/sclera: Conjunctivae normal.     Pupils: Pupils are equal, round, and reactive to light.  Neck:     Thyroid: No thyromegaly.     Vascular: No JVD.  Cardiovascular:     Rate and Rhythm: Normal rate and regular rhythm.     Heart sounds: Normal heart sounds. No murmur heard.    No friction rub. No gallop.  Pulmonary:     Effort: Pulmonary effort is normal. No respiratory distress.     Breath sounds: Normal breath sounds. No wheezing or rales.  Chest:     Chest wall: No tenderness.   Abdominal:     General: Bowel sounds are normal. There is no distension.     Palpations: Abdomen is soft. There is no mass.     Tenderness: There is no abdominal tenderness. There is no guarding or rebound.  Musculoskeletal:        General: No tenderness. Normal range of motion.     Cervical back: Normal range of motion.  Lymphadenopathy:  Cervical: No cervical adenopathy.  Skin:    General: Skin is warm and dry.     Findings: No rash.  Neurological:     Mental Status: He is alert and oriented to person, place, and time.     Cranial Nerves: No cranial nerve deficit.     Motor: No abnormal muscle tone.     Coordination: Coordination normal.     Gait: Gait normal.     Deep Tendon Reflexes: Reflexes are normal and symmetric.  Psychiatric:        Behavior: Behavior normal.        Thought Content: Thought content normal.        Judgment: Judgment normal.     Lab Results  Component Value Date   WBC 11.2 (H) 04/09/2022   HGB 12.6 (L) 04/09/2022   HCT 38.3 (L) 04/09/2022   PLT 289.0 04/09/2022   GLUCOSE 71 04/09/2022   CHOL 162 01/11/2022   TRIG 211.0 (H) 01/11/2022   HDL 39.20 01/11/2022   LDLDIRECT 96.0 01/11/2022   LDLCALC 59 10/16/2020   ALT 34 04/09/2022   AST 28 04/09/2022   NA 137 04/09/2022   K 3.9 04/09/2022   CL 99 04/09/2022   CREATININE 0.98 04/09/2022   BUN 23 04/09/2022   CO2 33 (H) 04/09/2022   TSH 2.13 01/11/2022   PSA 0.64 01/11/2022   HGBA1C 8.6 (H) 01/11/2022   MICROALBUR 3.4 (H) 01/11/2022    VAS US RENAL ARTERY DUPLEX  Result Date: 12/23/2020 ABDOMINAL VISCERAL Patient Name:  SERGE MAIN  Date of Exam:   12/22/2020 Medical Rec #: [id number]       Accession #:    [id number] Date of Birth: [birth year]       Patient Gender: M Patient Age:   4Y Exam Location:  [facility] Procedure:      VAS US RENAL ARTERY DUPLEX Referring Phys: [id number] An[address] Slade [place] -------------------------------------------------------------------------------- Indications:  Uncontrolled HTN; on several medications to control BP; denies              abdominal pain. High Risk Factors: Hypertension, hyperlipidemia, Diabetes, no history of                    smoking. Limitations: Air/bowel gas and obesity. Performing Technologist: Wilkie Aye RVT  Examination Guidelines: A complete evaluation includes B-mode imaging, spectral Doppler, color Doppler, and power Doppler as needed of all accessible portions of each vessel. Bilateral testing is considered an integral part of a complete examination. Limited examinations for reoccurring indications may be performed as noted.  Duplex Findings: +--------------------+--------+--------+------+--------+ Mesenteric          PSV cm/sEDV cm/sPlaqueComments +--------------------+--------+--------+------+--------+ Aorta Prox             99      7                   +--------------------+--------+--------+------+--------+ Aorta Distal          105      2                   +--------------------+--------+--------+------+--------+ Celiac Artery Origin  113      24                  +--------------------+--------+--------+------+--------+ SMA Origin            152                          +--------------------+--------+--------+------+--------+  +------------------+--------+--------+-------+  Right Renal ArteryPSV cm/sEDV cm/sComment +------------------+--------+--------+-------+ Origin               61      21           +------------------+--------+--------+-------+ Proximal             53      12           +------------------+--------+--------+-------+ Mid                 134      29           +------------------+--------+--------+-------+ Distal              121      30           +------------------+--------+--------+-------+ +-----------------+--------+--------+-------+ Left Renal ArteryPSV cm/sEDV cm/sComment +-----------------+--------+--------+-------+ Origin              62      16            +-----------------+--------+--------+-------+ Proximal           123      22           +-----------------+--------+--------+-------+ Mid                142      34           +-----------------+--------+--------+-------+ Distal             103      30           +-----------------+--------+--------+-------+ +------------+--------+--------+----+-----------+--------+--------+----+ Right KidneyPSV cm/sEDV cm/sRI  Left KidneyPSV cm/sEDV cm/sRI   +------------+--------+--------+----+-----------+--------+--------+----+ Upper Pole  46      15      0.67Upper Pole 83      29      0.65 +------------+--------+--------+----+-----------+--------+--------+----+ Mid         30      10      0.66Mid        25      8       0.66 +------------+--------+--------+----+-----------+--------+--------+----+ Lower Pole  39      13      0.68Lower Pole 26      9       0.63 +------------+--------+--------+----+-----------+--------+--------+----+ Hilar       66      20      0.70Hilar      52      11      0.79 +------------+--------+--------+----+-----------+--------+--------+----+ +------------------+-------+------------------+-------+ Right Kidney             Left Kidney               +------------------+-------+------------------+-------+ RAR (manual)      1.4    RAR (manual)      1.4     +------------------+-------+------------------+-------+ Cortex thickness  7.50 mmCorex thickness   9.80 mm +------------------+-------+------------------+-------+ Kidney length (cm)13.90  Kidney length (cm)13.90   +------------------+-------+------------------+-------+  Summary: Renal:  Right: Normal size right kidney. Normal right Resisitive Index.        Normal cortical thickness of right kidney. No evidence of        right renal artery stenosis. RRV flow present. Left:  Normal size of left kidney. Normal left Resistive Index.        Normal cortical thickness of the left kidney. No  evidence of        left renal artery stenosis. LRV flow present. Mesenteric: Normal Superior Mesenteric artery and Celiac  artery findings.  *See table(s) above for measurements and observations.  Diagnosing physician: Larae Grooms MD  Electronically signed by Larae Grooms MD on 12/23/2020 at 1:13:09 PM.    Final     Assessment & Plan:   Problem List Items Addressed This Visit     Dyslipidemia - Primary   Relevant Medications   citalopram (CELEXA) 40 MG tablet   Other Relevant Orders   CBC with Differential/Platelet (Completed)   Comprehensive metabolic panel (Completed)   Testosterone (Completed)   Hypogonadism in male    Lack of libido, lack of motivation, chronic fatigue - worse Check labs including testosterone level.  We discussed his meds that may affect libido (i.e. escitalopram and others). Will replace testosterone deficiency if detected.  Potential benefits of a long term testosterone use as well as potential risks  and complications were explained to the patient and were aknowledged.        Relevant Medications   citalopram (CELEXA) 40 MG tablet   Anxiety disorder, unspecified    Depressed mood, lack of libido, lack of motivation, chronic fatigue - worse Check labs including testosterone level.  We discussed his meds that may affect libido (i.e. escitalopram and others). Will replace testosterone deficiency if detected.  Potential benefits of a long term testosterone use as well as potential risks  and complications were explained to the patient and were aknowledged.      Relevant Medications   citalopram (CELEXA) 40 MG tablet      Meds ordered this encounter  Medications   citalopram (CELEXA) 40 MG tablet    Sig: Take 0.5 tablets (20 mg total) by mouth daily.    Dispense:  90 tablet    Refill:  1      Follow-up: Return in about 2 months (around 06/09/2022) for a follow-up visit.  Walker Kehr, MD

## 2022-04-09 NOTE — Progress Notes (Deleted)
Cardiology Office Note:    Date:  04/09/2022   ID:  Victor Byrd, DOB Oct 25, 1965, MRN [id number]  PCP:  Cassandria Anger, MD   [facility] Providers Cardiologist:  Victor Fulgham [place], MD Cardiology APP:  Victor Bottcher, PA     Referring MD: Cassandria Anger, MD   No chief complaint on file.   History of Present Illness:    Victor Byrd is a 56 y.o. male with a hx of severe HTN and elevated coronary calcium score, morbid obesity, OSA, HTN, HLD, and DM2. Renal dopplers negative for RAS. Stress echo in 2013 was normal. Calcium score in 10/2020 was 161 placing him in the 88th percentile. He was last seen by Dr. Martinique 03/19/21 and was asymptomatic. He was planning evaluation with [facility] [place] [facility] for bariatric surgery.  He was seen on 10/05/2021 with hypertensive urgency and documented home BPs of 205/101.  His log presented BPs taken 10 to 15 minutes after medications.  His work schedule did not allow blood pressure log at other times.  He was very frustrated because he had reduced soda and sodium intake.  I opted to add carvedilol 6.25 mg twice daily given a heart rate of 91 in the office.  On recheck in the office his BP was 126/80.   On subsequent follow up since June his BP control has been good. He does complain of excessive fatigue on exertion. Has some dyspnea but no chest pain. Had sleep study recently showing mild to moderate OSA - Auto- titration CPAP trial ordered by Dr Victor Byrd.   He wears a knee brace on the left for bone on bone - needs a knee replacement.    Past Medical History:  Diagnosis Date   Anxiety    Asthma    Depression    Diabetes mellitus type II    Hyperlipidemia    Hypertension    Hyperthyroidism    Pt denies thyroid issues   OSA on CPAP    Dr. Chalmers Byrd   Restless legs    Sleep apnea    cpap    Past Surgical History:  Procedure Laterality Date   COLONOSCOPY  11/01/2020   Victor Cellar, MD   [place]  2007   WISDOM TOOTH EXTRACTION  1992    Current Medications: No outpatient medications have been marked as taking for the 04/15/22 encounter (Appointment) with Byrd, Nira Visscher M, MD.     Allergies:   Dapagliflozin and Ramipril   Social History   Socioeconomic History   Marital status: Married    Spouse name: Not on file   Number of children: 2   Years of education: Not on file   Highest education level: Not on file  Occupational History   Occupation: Public house manager: UNEMPLOYED    Comment: out of work  Tobacco Use   Smoking status: Never   Smokeless tobacco: Never  Vaping Use   Vaping Use: Never used  Substance and Sexual Activity   Alcohol use: Yes    Alcohol/week: 0.0 standard drinks of alcohol   Drug use: No   Sexual activity: Yes  Other Topics Concern   Not on file  Social History Narrative   Not on file   Social Determinants of Health   Financial Resource Strain: Not on file  Food Insecurity: Not on file  Transportation Needs: Not on file  Physical Activity: Not on file  Stress: Not on file  Social Connections: Not on file     Family History: The patient's family history includes Colon polyps in his father; Diabetes in his mother and another family member; Heart attack in his father; Heart disease in his father; Hyperlipidemia in an other family member; Hypertension in his father and another family member; Lung cancer in an other family member. There is no history of Colon cancer, Esophageal cancer, Rectal cancer, or Stomach cancer.  ROS:   Please see the history of present illness.     All other systems reviewed and are negative.  EKGs/Labs/Other Studies Reviewed:    The following studies were reviewed today:  CT calcium score 10/27/20:  IMPRESSION: Coronary calcium score of 161. This was 88th percentile for age-, race-, and sex-matched controls.  EKG:  EKG is not ordered today.    Recent Labs: 01/11/2022: ALT 27; BUN 16; Creatinine,  Ser 0.82; Hemoglobin 12.5; Platelets 255.0; Potassium 4.1; Sodium 139; TSH 2.13  Recent Lipid Panel    Component Value Date/Time   CHOL 162 01/11/2022 0958   TRIG 211.0 (H) 01/11/2022 0958   HDL 39.20 01/11/2022 0958   CHOLHDL 4 01/11/2022 0958   VLDL 42.2 (H) 01/11/2022 0958   LDLCALC 59 10/16/2020 1449   LDLDIRECT 96.0 01/11/2022 0958     Risk Assessment/Calculations:           Physical Exam:    VS:  There were no vitals taken for this visit.    Wt Readings from Last 3 Encounters:  03/14/22 291 lb 9.6 oz (132.3 kg)  02/07/22 292 lb 9.6 oz (132.7 kg)  01/14/22 296 lb (134.3 kg)     GEN: morbidly obese male in NAD HEENT: Normal NECK: No JVD; No carotid bruits LYMPHATICS: No lymphadenopathy CARDIAC: RRR, no murmurs, rubs, gallops RESPIRATORY:  scattered wheezing ABDOMEN: Soft, non-tender, non-distended MUSCULOSKELETAL:  mild B LE edema; No deformity  SKIN: Warm and dry NEUROLOGIC:  Alert and oriented x 3 PSYCHIATRIC:  Normal affect   ASSESSMENT:    No diagnosis found.   PLAN:    In order of problems listed above:  Hx of poorly controlled HTN - no evidence RAS - maintained on norvasc, clonidine, telmisartan-HCTZ, cardura, and carvedilol - Pressure has done well over the past 3 months - may improve with Rx of sleep apnea and weight loss.    Hyperlipidemia with LDL goal < 70 Most recent panel showed increase in LDL to 96.  - will increase lipitor to 20 mg daily.  - repeat fasting lab in 3 months   Coronary artery calcifications Aortic atherosclerosis Elevated coronary calcium score Continue risk factor modification Continue ASA Patient has significant exertional fatigue and dyspnea- possible anginal equivalent. Last stress test was 10 years ago. Will arrange for [place] PET study.    Morbid obesity with OSA - per Dr Victor Byrd - Autotitration trial CPAP   Follow up post PET   Medication Adjustments/Labs and Tests Ordered: Current medicines are  reviewed at length with the patient today.  Concerns regarding medicines are outlined above.  No orders of the defined types were placed in this encounter.  No orders of the defined types were placed in this encounter.   There are no Patient Instructions on file for this visit.   Signed, Jonathyn Carothers [place], MD  04/09/2022 9:22 AM    [facility]

## 2022-04-09 NOTE — Assessment & Plan Note (Addendum)
Lack of libido, lack of motivation, chronic fatigue - worse Check labs including testosterone level.  We discussed his meds that may affect libido (i.e. escitalopram and others). Will replace testosterone deficiency if detected.  Potential benefits of a long term testosterone use as well as potential risks  and complications were explained to the patient and were aknowledged.

## 2022-04-10 ENCOUNTER — Other Ambulatory Visit (HOSPITAL_COMMUNITY): Payer: Self-pay

## 2022-04-10 LAB — COMPREHENSIVE METABOLIC PANEL
ALT: 34 U/L (ref 0–53)
AST: 28 U/L (ref 0–37)
Albumin: 4.3 g/dL (ref 3.5–5.2)
Alkaline Phosphatase: 51 U/L (ref 39–117)
BUN: 23 mg/dL (ref 6–23)
CO2: 33 mEq/L — ABNORMAL HIGH (ref 19–32)
Calcium: 9.7 mg/dL (ref 8.4–10.5)
Chloride: 99 mEq/L (ref 96–112)
Creatinine, Ser: 0.98 mg/dL (ref 0.40–1.50)
GFR: 87.25 mL/min (ref >60.00)
Glucose, Bld: 71 mg/dL (ref 70–99)
Potassium: 3.9 mEq/L (ref 3.5–5.1)
Sodium: 137 mEq/L (ref 135–145)
Total Bilirubin: 0.4 mg/dL (ref 0.2–1.2)
Total Protein: 7.6 g/dL (ref 6.0–8.3)

## 2022-04-10 LAB — CBC WITH DIFFERENTIAL/PLATELET
Basophils Absolute: 0.1 10*3/uL (ref 0.0–0.1)
Basophils Relative: 1.3 % (ref 0.0–3.0)
Eosinophils Absolute: 0.4 10*3/uL (ref 0.0–0.7)
Eosinophils Relative: 3.6 % (ref 0.0–5.0)
HCT: 38.3 % — ABNORMAL LOW (ref 39.0–52.0)
Hemoglobin: 12.6 g/dL — ABNORMAL LOW (ref 13.0–17.0)
Lymphocytes Relative: 32.4 % (ref 12.0–46.0)
Lymphs Abs: 3.6 10*3/uL (ref 0.7–4.0)
MCHC: 33 g/dL (ref 30.0–36.0)
MCV: 79.5 fl (ref 78.0–100.0)
Monocytes Absolute: 1 10*3/uL (ref 0.1–1.0)
Monocytes Relative: 8.7 % (ref 3.0–12.0)
Neutro Abs: 6 10*3/uL (ref 1.4–7.7)
Neutrophils Relative %: 54 % (ref 43.0–77.0)
Platelets: 289 10*3/uL (ref 150.0–400.0)
RBC: 4.82 Mil/uL (ref 4.22–5.81)
RDW: 14.4 % (ref 11.5–15.5)
WBC: 11.2 10*3/uL — ABNORMAL HIGH (ref 4.0–10.5)

## 2022-04-10 LAB — TESTOSTERONE: Testosterone: 256.41 ng/dL — ABNORMAL LOW (ref 300.00–890.00)

## 2022-04-11 ENCOUNTER — Other Ambulatory Visit: Payer: Self-pay | Admitting: Cardiology

## 2022-04-11 ENCOUNTER — Telehealth (HOSPITAL_COMMUNITY): Payer: Self-pay | Admitting: Emergency Medicine

## 2022-04-11 ENCOUNTER — Other Ambulatory Visit (HOSPITAL_COMMUNITY): Payer: Self-pay

## 2022-04-11 DIAGNOSIS — I2510 Atherosclerotic heart disease of native coronary artery without angina pectoris: Secondary | ICD-10-CM

## 2022-04-11 DIAGNOSIS — I25118 Atherosclerotic heart disease of native coronary artery with other forms of angina pectoris: Secondary | ICD-10-CM

## 2022-04-15 ENCOUNTER — Ambulatory Visit: Payer: No Typology Code available for payment source | Admitting: Cardiology

## 2022-04-15 NOTE — Assessment & Plan Note (Signed)
Depressed mood, lack of libido, lack of motivation, chronic fatigue - worse Check labs including testosterone level.  We discussed his meds that may affect libido (i.e. escitalopram and others). Will replace testosterone deficiency if detected.  Potential benefits of a long term testosterone use as well as potential risks  and complications were explained to the patient and were aknowledged.

## 2022-04-15 NOTE — Telephone Encounter (Signed)
Reaching out to patient to offer assistance regarding upcoming cardiac imaging study; pt verbalizes understanding of appt date/time, parking situation and where to check in, pre-test NPO status and medications ordered, and verified current allergies; name and call back number provided for further questions should they arise Marchia Bond RN Navigator Cardiac Imaging [facility] [phone] office [phone] cell  Denies iv issues Holding carvedilol, tadalafil, nitro Avoiding caffeine 12 h Avoiding food 3 h

## 2022-04-16 ENCOUNTER — Encounter (HOSPITAL_COMMUNITY): Payer: Self-pay

## 2022-04-16 ENCOUNTER — Encounter (HOSPITAL_COMMUNITY)
Admission: RE | Admit: 2022-04-16 | Discharge: 2022-04-16 | Disposition: A | Discharge status: Home / Self Care | Payer: No Typology Code available for payment source | Source: Ambulatory Visit | Attending: Cardiology | Admitting: Cardiology

## 2022-04-16 DIAGNOSIS — E7800 Pure hypercholesterolemia, unspecified: Secondary | ICD-10-CM | POA: Insufficient documentation

## 2022-04-16 DIAGNOSIS — G4733 Obstructive sleep apnea (adult) (pediatric): Secondary | ICD-10-CM | POA: Insufficient documentation

## 2022-04-16 DIAGNOSIS — I2510 Atherosclerotic heart disease of native coronary artery without angina pectoris: Secondary | ICD-10-CM | POA: Insufficient documentation

## 2022-04-16 DIAGNOSIS — I2584 Coronary atherosclerosis due to calcified coronary lesion: Secondary | ICD-10-CM | POA: Insufficient documentation

## 2022-04-16 DIAGNOSIS — R0609 Other forms of dyspnea: Secondary | ICD-10-CM | POA: Insufficient documentation

## 2022-04-16 DIAGNOSIS — T733XXA Exhaustion due to excessive exertion, initial encounter: Secondary | ICD-10-CM | POA: Insufficient documentation

## 2022-04-16 DIAGNOSIS — I10 Essential (primary) hypertension: Secondary | ICD-10-CM | POA: Insufficient documentation

## 2022-04-16 LAB — NM PET CT CARDIAC PERFUSION MULTI W/ABSOLUTE BLOODFLOW
MBFR: 2.62
Nuc Rest EF: 46 %
Nuc Stress EF: 52 %
Rest MBF: 0.58 ml/g/min
ST Depression (mm): 0 mm
Stress MBF: 1.52 ml/g/min
TID: 1.01

## 2022-04-16 MED ORDER — RUBIDIUM RB82 GENERATOR (RUBYFILL)
29.9000 | PACK | Freq: Once | INTRAVENOUS | Status: AC
  Administered 2022-04-16: 29.9 via INTRAVENOUS

## 2022-04-16 MED ORDER — REGADENOSON 0.4 MG/5ML IV SOLN
INTRAVENOUS | Status: AC
  Administered 2022-04-16: 0.4 mg via INTRAVENOUS
  Filled 2022-04-16: qty 5

## 2022-04-16 MED ORDER — REGADENOSON 0.4 MG/5ML IV SOLN: 0.4000 mg | Freq: Once | INTRAVENOUS | Status: AC

## 2022-04-16 NOTE — Progress Notes (Signed)
Pt tolerated exam without incident; pt denies lightheadedness or dizziness; pt ambulatory to lobby steady gait noted  

## 2022-04-19 ENCOUNTER — Other Ambulatory Visit (HOSPITAL_COMMUNITY): Payer: Self-pay

## 2022-04-22 NOTE — Telephone Encounter (Signed)
Last testosterone was check on 11//7/23. You inform him abt taking injections.Marland KitchenJohny Byrd

## 2022-04-23 ENCOUNTER — Other Ambulatory Visit: Payer: Self-pay

## 2022-04-23 ENCOUNTER — Other Ambulatory Visit: Payer: Self-pay | Admitting: Internal Medicine

## 2022-04-23 DIAGNOSIS — R0609 Other forms of dyspnea: Secondary | ICD-10-CM

## 2022-04-23 DIAGNOSIS — I25118 Atherosclerotic heart disease of native coronary artery with other forms of angina pectoris: Secondary | ICD-10-CM

## 2022-04-23 DIAGNOSIS — I2510 Atherosclerotic heart disease of native coronary artery without angina pectoris: Secondary | ICD-10-CM

## 2022-04-23 MED ORDER — "BD DISP NEEDLE 23G X 1-1/4"" MISC"
0 refills | Status: AC
  Filled 2022-04-23: qty 50, 30d supply, fill #0

## 2022-04-23 MED ORDER — TESTOSTERONE CYPIONATE 200 MG/ML IM SOLN
200.0000 mg | INTRAMUSCULAR | 1 refills | Status: DC
  Filled 2022-04-23: qty 6, 84d supply, fill #0
  Filled 2022-07-05: qty 10, 140d supply, fill #1
  Filled 2022-07-13: qty 6, 84d supply, fill #1
  Filled 2022-10-14: qty 6, 84d supply, fill #2

## 2022-04-24 ENCOUNTER — Telehealth: Payer: Self-pay | Admitting: *Deleted

## 2022-04-24 ENCOUNTER — Other Ambulatory Visit (HOSPITAL_COMMUNITY): Payer: Self-pay

## 2022-04-24 NOTE — Telephone Encounter (Signed)
Ot need PA on testosterone submitted w/ (Key: [id number]). Your information has been sent to [name]...Victor Byrd

## 2022-04-27 ENCOUNTER — Other Ambulatory Visit (HOSPITAL_COMMUNITY): Payer: Self-pay

## 2022-04-29 ENCOUNTER — Other Ambulatory Visit (HOSPITAL_COMMUNITY): Payer: Self-pay

## 2022-04-29 NOTE — Telephone Encounter (Signed)
Rec'd determination med was APPROVED. The authorization is effective from 04/24/2022 to 04/24/2023. Faxed approval to [facility].Marland KitchenJohny Chess

## 2022-05-02 ENCOUNTER — Ambulatory Visit: Payer: No Typology Code available for payment source | Admitting: Cardiology

## 2022-05-03 ENCOUNTER — Other Ambulatory Visit (HOSPITAL_COMMUNITY): Payer: Self-pay

## 2022-05-05 ENCOUNTER — Other Ambulatory Visit: Payer: Self-pay | Admitting: Internal Medicine

## 2022-05-06 ENCOUNTER — Other Ambulatory Visit (HOSPITAL_COMMUNITY): Payer: Self-pay

## 2022-05-06 MED ORDER — DOXAZOSIN MESYLATE 8 MG PO TABS
4.0000 mg | ORAL_TABLET | Freq: Every day | ORAL | 1 refills | Status: DC
  Filled 2022-05-06: qty 90, 90d supply, fill #0
  Filled 2022-08-02 – 2022-08-03 (×2): qty 90, 90d supply, fill #1

## 2022-05-13 ENCOUNTER — Other Ambulatory Visit (HOSPITAL_COMMUNITY): Payer: Self-pay

## 2022-05-13 ENCOUNTER — Other Ambulatory Visit: Payer: Self-pay

## 2022-05-14 ENCOUNTER — Other Ambulatory Visit (HOSPITAL_COMMUNITY): Payer: Self-pay

## 2022-05-16 ENCOUNTER — Other Ambulatory Visit (HOSPITAL_COMMUNITY): Payer: No Typology Code available for payment source

## 2022-05-22 ENCOUNTER — Other Ambulatory Visit: Payer: Self-pay | Admitting: Internal Medicine

## 2022-05-23 ENCOUNTER — Other Ambulatory Visit (HOSPITAL_COMMUNITY): Payer: Self-pay

## 2022-05-23 MED ORDER — ALBUTEROL SULFATE HFA 108 (90 BASE) MCG/ACT IN AERS
2.0000 | INHALATION_SPRAY | Freq: Four times a day (QID) | RESPIRATORY_TRACT | 1 refills | Status: AC | PRN
  Filled 2022-05-23: qty 6.7, 25d supply, fill #0

## 2022-06-02 NOTE — Progress Notes (Unsigned)
Cardiology Office Note:    Date:  06/02/2022   ID:  Victor Byrd, DOB 1966-03-31, MRN [id number]  PCP:  Cassandria Anger, MD   [facility] Providers Cardiologist:  Taleen Prosser Martinique, MD Cardiology APP:  Ledora Bottcher, PA     Referring MD: Cassandria Anger, MD   No chief complaint on file.   History of Present Illness:    Victor Byrd is a 56 y.o. male with a hx of severe HTN and elevated coronary calcium score, morbid obesity, OSA, HTN, HLD, and DM2. Renal dopplers negative for RAS. Stress echo in 2013 was normal. Calcium score in 10/2020 was 161 placing him in the 88th percentile. He was last seen by Dr. Martinique 03/19/21 and was asymptomatic. He was planning evaluation with [place] [facility] for bariatric surgery.  He was seen on 10/05/2021 with hypertensive urgency and documented home BPs of 205/101.  His log presented BPs taken 10 to 15 minutes after medications.  His work schedule did not allow blood pressure log at other times.  He was very frustrated because he had reduced soda and sodium intake.  I opted to add carvedilol 6.25 mg twice daily given a heart rate of 91 in the office.  On recheck in the office his BP was 126/80.   On subsequent follow up since June his BP control has been good. He does complain of excessive fatigue on exertion. Has some dyspnea but no chest pain. Had sleep study recently showing mild to moderate OSA - Auto- titration CPAP trial ordered by Dr Elsworth Soho. To further evaluate we performed a Cardiac PET CT which showed normal perfusion. EF 46%. Echo ordered but not done.   He wears a knee brace on the left for bone on bone - needs a knee replacement.    Past Medical History:  Diagnosis Date   Anxiety    Asthma    Depression    Diabetes mellitus type II    Hyperlipidemia    Hypertension    Hyperthyroidism    Pt denies thyroid issues   OSA on CPAP    Dr. Chalmers Cater   Restless legs    Sleep apnea    cpap    Past Surgical History:   Procedure Laterality Date   COLONOSCOPY  11/01/2020   Marshall Cellar, MD   [place]  2007   WISDOM TOOTH EXTRACTION  1992    Current Medications: No outpatient medications have been marked as taking for the 06/06/22 encounter (Appointment) with Martinique, Tripp Goins M, MD.     Allergies:   Dapagliflozin and Ramipril   Social History   Socioeconomic History   Marital status: Married    Spouse name: Not on file   Number of children: 2   Years of education: Not on file   Highest education level: Not on file  Occupational History   Occupation: Public house manager: UNEMPLOYED    Comment: out of work  Tobacco Use   Smoking status: Never   Smokeless tobacco: Never  Vaping Use   Vaping Use: Never used  Substance and Sexual Activity   Alcohol use: Yes    Alcohol/week: 0.0 standard drinks of alcohol   Drug use: No   Sexual activity: Yes  Other Topics Concern   Not on file  Social History Narrative   Not on file   Social Determinants of Health   Financial Resource Strain: Not on file  Food Insecurity:  Not on file  Transportation Needs: Not on file  Physical Activity: Not on file  Stress: Not on file  Social Connections: Not on file     Family History: The patient's family history includes Colon polyps in his father; Diabetes in his mother and another family member; Heart attack in his father; Heart disease in his father; Hyperlipidemia in an other family member; Hypertension in his father and another family member; Lung cancer in an other family member. There is no history of Colon cancer, Esophageal cancer, Rectal cancer, or Stomach cancer.  ROS:   Please see the history of present illness.     All other systems reviewed and are negative.  EKGs/Labs/Other Studies Reviewed:    The following studies were reviewed today:  CT calcium score 10/27/20:  IMPRESSION: Coronary calcium score of 161. This was 88th percentile for age-, race-, and  sex-matched controls.  Cardiac PET CT: 04/16/22:   The study is normal. The study is low risk.   Rest left ventricular function is abnormal. Rest global function is mildly reduced. Rest EF: 46 %. Stress left ventricular function is normal. Stress EF: 52 %. End diastolic cavity size is normal. End systolic cavity size is normal.   Myocardial blood flow was computed to be 0.65m/g/min at rest and 1.563mg/min at stress. Global myocardial blood flow reserve was 2.62 and was normal.   Coronary calcium was present on the attenuation correction CT images. Moderate coronary calcifications were present. Coronary calcifications were present in the left anterior descending artery, left circumflex artery and right coronary artery distribution(s).   CLINICAL DATA:  This over-read does not include interpretation of cardiac or coronary anatomy or pathology. The Cardiac PET CT interpretation by the cardiologist is attached.   COMPARISON:  Coronary calcium score November 2022.   FINDINGS: Vascular: Please see dedicated report for cardiovascular details.   Mediastinum/Nodes: No adenopathy or acute process in the mediastinum.   Lungs/Pleura: Airways are patent to the extent evaluated. Lungs are clear to the extent evaluated in there is no sign of pleural effusion.   Upper Abdomen: Incidental imaging of upper abdominal contents without acute process.   Musculoskeletal: No acute bone finding. No destructive bone process. Spinal degenerative changes.   IMPRESSION: No acute findings or significant extracardiac findings.     Electronically Signed   By: GeZetta Bills.D.   On: 04/16/2022 16:21      EKG:  EKG is not ordered today.    Recent Labs: 01/11/2022: TSH 2.13 04/09/2022: ALT 34; BUN 23; Creatinine, Ser 0.98; Hemoglobin 12.6; Platelets 289.0; Potassium 3.9; Sodium 137  Recent Lipid Panel    Component Value Date/Time   CHOL 162 01/11/2022 0958   TRIG 211.0 (H) 01/11/2022 0958   HDL  39.20 01/11/2022 0958   CHOLHDL 4 01/11/2022 0958   VLDL 42.2 (H) 01/11/2022 0958   LDLCALC 59 10/16/2020 1449   LDLDIRECT 96.0 01/11/2022 0958     Risk Assessment/Calculations:           Physical Exam:    VS:  There were no vitals taken for this visit.    Wt Readings from Last 3 Encounters:  04/09/22 283 lb 6.4 oz (128.5 kg)  03/14/22 291 lb 9.6 oz (132.3 kg)  02/07/22 292 lb 9.6 oz (132.7 kg)     GEN: morbidly obese male in NAD HEENT: Normal NECK: No JVD; No carotid bruits LYMPHATICS: No lymphadenopathy CARDIAC: RRR, no murmurs, rubs, gallops RESPIRATORY:  scattered wheezing ABDOMEN: Soft, non-tender, non-distended MUSCULOSKELETAL:  mild B LE edema; No deformity  SKIN: Warm and dry NEUROLOGIC:  Alert and oriented x 3 PSYCHIATRIC:  Normal affect   ASSESSMENT:    No diagnosis found.   PLAN:    In order of problems listed above:  Hx of poorly controlled HTN - no evidence RAS - maintained on norvasc, clonidine, telmisartan-HCTZ, cardura, and carvedilol - Pressure has done well over the past 3 months - may improve with Rx of sleep apnea and weight loss.    Hyperlipidemia with LDL goal < 70 Most recent panel showed increase in LDL to 96.  - will increase lipitor to 20 mg daily.  - repeat fasting lab in 3 months   Coronary artery calcifications Aortic atherosclerosis Elevated coronary calcium score Continue risk factor modification Continue ASA Normal perfusion by Cardiac PET CT   Morbid obesity with OSA - per Dr Elsworth Soho - Autotitration trial CPAP     Medication Adjustments/Labs and Tests Ordered: Current medicines are reviewed at length with the patient today.  Concerns regarding medicines are outlined above.  No orders of the defined types were placed in this encounter.  No orders of the defined types were placed in this encounter.   There are no Patient Instructions on file for this visit.   Signed, Dhwani Venkatesh Martinique, MD  06/02/2022 7:10 AM     [facility]

## 2022-06-05 ENCOUNTER — Other Ambulatory Visit (HOSPITAL_COMMUNITY): Payer: Self-pay

## 2022-06-05 DIAGNOSIS — R809 Proteinuria, unspecified: Secondary | ICD-10-CM | POA: Diagnosis not present

## 2022-06-05 DIAGNOSIS — E7800 Pure hypercholesterolemia, unspecified: Secondary | ICD-10-CM | POA: Diagnosis not present

## 2022-06-05 DIAGNOSIS — I10 Essential (primary) hypertension: Secondary | ICD-10-CM | POA: Diagnosis not present

## 2022-06-05 DIAGNOSIS — G609 Hereditary and idiopathic neuropathy, unspecified: Secondary | ICD-10-CM | POA: Diagnosis not present

## 2022-06-05 DIAGNOSIS — E1165 Type 2 diabetes mellitus with hyperglycemia: Secondary | ICD-10-CM | POA: Diagnosis not present

## 2022-06-05 MED ORDER — INSULIN GLARGINE-YFGN 100 UNIT/ML ~~LOC~~ SOPN
PEN_INJECTOR | SUBCUTANEOUS | 0 refills | Status: DC
  Filled 2022-06-05 – 2022-07-05 (×2): qty 15, 10d supply, fill #0

## 2022-06-05 MED ORDER — RYBELSUS 14 MG PO TABS
ORAL_TABLET | ORAL | 5 refills | Status: AC
  Filled 2022-06-05: qty 30, 30d supply, fill #0

## 2022-06-06 ENCOUNTER — Other Ambulatory Visit (HOSPITAL_COMMUNITY): Payer: Self-pay

## 2022-06-06 ENCOUNTER — Encounter: Payer: Self-pay | Admitting: Cardiology

## 2022-06-06 ENCOUNTER — Ambulatory Visit: Payer: 59 | Attending: Cardiology | Admitting: Cardiology

## 2022-06-06 ENCOUNTER — Other Ambulatory Visit: Payer: Self-pay | Admitting: Internal Medicine

## 2022-06-06 VITALS — BP 127/76 | HR 82 | Ht 72.0 in | Wt 296.8 lb

## 2022-06-06 DIAGNOSIS — I2510 Atherosclerotic heart disease of native coronary artery without angina pectoris: Secondary | ICD-10-CM | POA: Diagnosis not present

## 2022-06-06 DIAGNOSIS — R0609 Other forms of dyspnea: Secondary | ICD-10-CM

## 2022-06-06 DIAGNOSIS — I2583 Coronary atherosclerosis due to lipid rich plaque: Secondary | ICD-10-CM

## 2022-06-06 DIAGNOSIS — E7800 Pure hypercholesterolemia, unspecified: Secondary | ICD-10-CM | POA: Diagnosis not present

## 2022-06-06 DIAGNOSIS — Z6841 Body Mass Index (BMI) 40.0 and over, adult: Secondary | ICD-10-CM | POA: Diagnosis not present

## 2022-06-06 DIAGNOSIS — I2584 Coronary atherosclerosis due to calcified coronary lesion: Secondary | ICD-10-CM | POA: Diagnosis not present

## 2022-06-06 LAB — BASIC METABOLIC PANEL
BUN/Creatinine Ratio: 17 (ref 9–20)
BUN: 16 mg/dL (ref 6–24)
CO2: 26 mmol/L (ref 20–29)
Calcium: 8.8 mg/dL (ref 8.7–10.2)
Chloride: 101 mmol/L (ref 96–106)
Creatinine, Ser: 0.94 mg/dL (ref 0.76–1.27)
Glucose: 306 mg/dL — ABNORMAL HIGH (ref 70–99)
Potassium: 4.3 mmol/L (ref 3.5–5.2)
Sodium: 140 mmol/L (ref 134–144)
eGFR: 96 mL/min/{1.73_m2} (ref >59)

## 2022-06-06 LAB — LIPID PANEL
Chol/HDL Ratio: 3.2 ratio (ref 0.0–5.0)
Cholesterol, Total: 116 mg/dL (ref 100–199)
HDL: 36 mg/dL — ABNORMAL LOW (ref >39)
LDL Chol Calc (NIH): 55 mg/dL (ref 0–99)
Triglycerides: 141 mg/dL (ref 0–149)
VLDL Cholesterol Cal: 25 mg/dL (ref 5–40)

## 2022-06-06 LAB — HEPATIC FUNCTION PANEL
ALT: 29 IU/L (ref 0–44)
AST: 20 IU/L (ref 0–40)
Albumin: 4.2 g/dL (ref 3.8–4.9)
Alkaline Phosphatase: 61 IU/L (ref 44–121)
Bilirubin Total: 0.4 mg/dL (ref 0.0–1.2)
Bilirubin, Direct: 0.14 mg/dL (ref 0.00–0.40)
Total Protein: 6.8 g/dL (ref 6.0–8.5)

## 2022-06-06 MED ORDER — INSULIN GLARGINE-YFGN 100 UNIT/ML ~~LOC~~ SOPN
PEN_INJECTOR | SUBCUTANEOUS | 11 refills | Status: DC
  Filled 2022-06-06: qty 30, 28d supply, fill #0
  Filled 2022-10-29 – 2022-11-08 (×2): qty 30, 28d supply, fill #1

## 2022-06-06 NOTE — Patient Instructions (Signed)
Medication Instructions:  Continue same medications *If you need a refill on your cardiac medications before your next appointment, please call your pharmacy*   Lab Work: Bmet.lipid and hepatic panels today   Testing/Procedures: Echo   Follow-Up: At [facility], you and your health needs are our priority.  As part of our continuing mission to provide you with exceptional heart care, we have created designated Provider Care Teams.  These Care Teams include your primary Cardiologist (physician) and Advanced Practice Providers (APPs -  Physician Assistants and Nurse Practitioners) who all work together to provide you with the care you need, when you need it.  We recommend signing up for the patient portal called "MyChart".  Sign up information is provided on this After Visit Summary.  MyChart is used to connect with patients for Virtual Visits (Telemedicine).  Patients are able to view lab/test results, encounter notes, upcoming appointments, etc.  Non-urgent messages can be sent to your provider as well.   To learn more about what you can do with MyChart, go to [email or web address].    Your next appointment:  6 months   Call in April to schedule July appointment     The format for your next appointment: Office     Provider:  Dr.Jordan's PA   Important Information About Sugar

## 2022-06-07 ENCOUNTER — Other Ambulatory Visit (HOSPITAL_COMMUNITY): Payer: Self-pay

## 2022-06-07 ENCOUNTER — Other Ambulatory Visit: Payer: Self-pay

## 2022-06-07 MED ORDER — FUROSEMIDE 20 MG PO TABS
20.0000 mg | ORAL_TABLET | Freq: Every day | ORAL | 3 refills | Status: DC | PRN
  Filled 2022-06-07: qty 180, 90d supply, fill #0
  Filled 2022-09-04: qty 180, 90d supply, fill #1
  Filled 2022-12-11 – 2022-12-12 (×2): qty 180, 90d supply, fill #2
  Filled 2023-03-19: qty 180, 90d supply, fill #3

## 2022-06-07 MED ORDER — AMLODIPINE BESYLATE 10 MG PO TABS
10.0000 mg | ORAL_TABLET | Freq: Every day | ORAL | 11 refills | Status: DC
  Filled 2022-06-07: qty 30, 30d supply, fill #0
  Filled 2022-06-30: qty 30, 30d supply, fill #1
  Filled 2022-08-02 – 2022-08-03 (×2): qty 30, 30d supply, fill #2
  Filled 2022-09-04: qty 30, 30d supply, fill #3
  Filled 2022-10-07: qty 30, 30d supply, fill #4
  Filled 2022-11-09: qty 30, 30d supply, fill #5
  Filled 2022-12-03: qty 30, 30d supply, fill #6
  Filled 2022-12-13 – 2023-01-13 (×2): qty 30, 30d supply, fill #7
  Filled 2023-02-06: qty 30, 30d supply, fill #8
  Filled 2023-03-11: qty 30, 30d supply, fill #9
  Filled 2023-04-05: qty 30, 30d supply, fill #10
  Filled 2023-05-07: qty 30, 30d supply, fill #11

## 2022-06-08 ENCOUNTER — Other Ambulatory Visit (HOSPITAL_COMMUNITY): Payer: Self-pay

## 2022-06-14 ENCOUNTER — Ambulatory Visit (INDEPENDENT_AMBULATORY_CARE_PROVIDER_SITE_OTHER): Payer: 59 | Admitting: Adult Health

## 2022-06-14 ENCOUNTER — Other Ambulatory Visit: Payer: Self-pay

## 2022-06-14 ENCOUNTER — Encounter: Payer: Self-pay | Admitting: Adult Health

## 2022-06-14 VITALS — BP 130/68 | HR 72 | Wt 297.6 lb

## 2022-06-14 DIAGNOSIS — G4733 Obstructive sleep apnea (adult) (pediatric): Secondary | ICD-10-CM

## 2022-06-14 DIAGNOSIS — Z6841 Body Mass Index (BMI) 40.0 and over, adult: Secondary | ICD-10-CM

## 2022-06-14 NOTE — Progress Notes (Signed)
$'@Patient'c$  ID: Victor Byrd, male    DOB: November 02, 1965, 57 y.o.   MRN: [id number]  Chief Complaint  Patient presents with   Follow-up    Referring provider: Cassandria Anger, MD  HPI: 57 year old male followed for obstructive sleep apnea  TEST/EVENTS :  01/2022 HST showed mild  OSA with AHI 13/ hr , low sat 87%    NPSG 07/2005 AHI 25/hour, lowest desaturation 82% , corrected by CPAP 8 cm with nasal pillows  06/14/2022 Follow up : OSA  Patient returns for a 59-monthfollow-up.  Patient has underlying sleep apnea was recently restarted back on CPAP.  Patient says he is trying to wear it every single night.  Does feel better wearing it with decreased daytime sleepiness.  Feels that he is benefiting from CPAP with decreased symptoms.  CPAP download shows excellent compliance with daily average usage at 100%.  Daily average usage at 9.5 hours.  Patient is on auto CPAP 5 to 15 cm H2O.  Daily average pressure at 10.5 cm H2O.  AHI is 1.0/hour   Allergies  Allergen Reactions   Dapagliflozin Other (See Comments)   Ramipril     cough    Immunization History  Administered Date(s) Administered   COVID-19, mRNA, vaccine(Comirnaty)12 years and older 03/05/2022   Influenza Whole 02/21/2010   Influenza, Quadrivalent, Recombinant, Inj, Pf 03/05/2022   Influenza,inj,Quad PF,6+ Mos 06/24/2013, 06/28/2015, 04/29/2017, 06/25/2018, 05/07/2019, 05/03/2020   Influenza,inj,Quad PF,6-35 Mos 02/15/2021   Influenza-Unspecified 06/25/2016   PFIZER(Purple Top)SARS-COV-2 Vaccination 09/03/2019, 09/28/2019   Pneumococcal Conjugate-13 12/01/2017   Pneumococcal Polysaccharide-23 07/01/2012   Tdap 09/11/2010    Past Medical History:  Diagnosis Date   Anxiety    Asthma    Depression    Diabetes mellitus type II    Hyperlipidemia    Hypertension    Hyperthyroidism    Pt denies thyroid issues   OSA on CPAP    Dr. BChalmers Cater  Restless legs    Sleep apnea    cpap    Tobacco History: Social History    Tobacco Use  Smoking Status Never  Smokeless Tobacco Never   Counseling given: Not Answered   Outpatient Medications Prior to Visit  Medication Sig Dispense Refill   albuterol (VENTOLIN HFA) 108 (90 Base) MCG/ACT inhaler Inhale 2 puffs into the lungs every 6 (six) hours as needed for wheezing 6.7 g 1   amLODipine (NORVASC) 10 MG tablet Take 1 tablet (10 mg total) by mouth daily. 30 tablet 11   aspirin 81 MG tablet Take 81 mg by mouth daily.     atorvastatin (LIPITOR) 20 MG tablet Take 1 tablet (20 mg total) by mouth daily. 90 tablet 3   buPROPion (WELLBUTRIN XL) 300 MG 24 hr tablet Take 1 tablet (300 mg total) by mouth daily. 90 tablet 3   carvedilol (COREG) 6.25 MG tablet Take 1 tablet by mouth 2  times daily with a meal. 180 tablet 3   citalopram (CELEXA) 40 MG tablet Take 0.5 tablets (20 mg total) by mouth daily. 90 tablet 1   cloNIDine (CATAPRES) 0.2 MG tablet Take 1 tablet (0.2 mg total) by mouth 2 (two) times daily. 60 tablet 11   Continuous Blood Gluc Sensor (DEXCOM G7 SENSOR) MISC Replace q 10 days 3 each 11   Continuous Blood Gluc Sensor (DEXCOM G7 SENSOR) MISC Change sensor every 10 days 3 each 11   Continuous Blood Gluc Sensor (DEXCOM G7 SENSOR) MISC Use as directed every 10 days 3 each  6   doxazosin (CARDURA) 8 MG tablet Take 1/2 to 1 tablet by mouth daily. 90 tablet 1   furosemide (LASIX) 20 MG tablet Take 1 - 2 tablets (20 - 40 mg total) by mouth daily as needed. 180 tablet 3   Ginkgo Biloba 40 MG TABS Take by mouth.     glimepiride (AMARYL) 4 MG tablet Take 1 tablet (4 mg total) by mouth 2 (two) times daily. 180 tablet 4   glucose blood test strip Use to test blood sugar up to three times daily as needed. 100 each 6   insulin glargine-yfgn (SEMGLEE, YFGN,) 100 UNIT/ML Pen Inject 30 units under the skin twice a day 45 mL 3   insulin glargine-yfgn (SEMGLEE, YFGN,) 100 UNIT/ML Pen Inject 60 Units into the skin every morning use 50 Units 2 times daily. 15 mL 0   insulin  glargine-yfgn (SEMGLEE, YFGN,) 100 UNIT/ML Pen Inject 60 units into the skin every morning and 50 units in the evening 36 mL 11   Lancets (FREESTYLE) lancets Use as directed to test blood sugar up to 3 times a day as needed. 100 each 6   magnesium 30 MG tablet Take 30 mg by mouth daily.     metFORMIN (GLUCOPHAGE) 1000 MG tablet Take 1 tablet by mouth 2 times a day 180 tablet 4   Multiple Vitamin (MULTIVITAMIN WITH MINERALS) TABS tablet Take 1 tablet by mouth daily.     NEEDLE, DISP, 23 G (B-D DISP NEEDLE 23GX1-1/4") 23G X 1-1/4" MISC Use for testosterone injections 50 each 0   Omega-3 Fatty Acids (FISH OIL) 1000 MG CAPS 1 capsule     Semaglutide (RYBELSUS) 14 MG TABS Take 1 tablet once daily at least 30 minutes before first food, beverage or other oral medicine of the day 30 tablet 5   Semaglutide (RYBELSUS) 7 MG TABS Take 1 tablet by mouth daily with 4 oz of water on an empty stomach 30 minutes before breakfast or other meds 30 tablet 11   tadalafil (CIALIS) 20 MG tablet Take 1 tablet (20 mg total) by mouth daily as needed for erectile dysfunction. 10 tablet 3   telmisartan-hydrochlorothiazide (MICARDIS HCT) 80-25 MG tablet Take 1 tablet by mouth daily. 90 tablet 3   testosterone cypionate (DEPOTESTOSTERONE CYPIONATE) 200 MG/ML injection Inject 1 mL (200 mg total) into the muscle every 14 days. 10 mL 1   No facility-administered medications prior to visit.     Review of Systems:   Constitutional:   No  weight loss, night sweats,  Fevers, chills, fatigue, or  lassitude.  HEENT:   No headaches,  Difficulty swallowing,  Tooth/dental problems, or  Sore throat,                No sneezing, itching, ear ache, nasal congestion, post nasal drip,   CV:  No chest pain,  Orthopnea, PND, swelling in lower extremities, anasarca, dizziness, palpitations, syncope.   GI  No heartburn, indigestion, abdominal pain, nausea, vomiting, diarrhea, change in bowel habits, loss of appetite, bloody stools.   Resp:  No shortness of breath with exertion or at rest.  No excess mucus, no productive cough,  No non-productive cough,  No coughing up of blood.  No change in color of mucus.  No wheezing.  No chest wall deformity  Skin: no rash or lesions.  GU: no dysuria, change in color of urine, no urgency or frequency.  No flank pain, no hematuria   MS:  No joint pain or swelling.  No decreased range of motion.  No back pain.    Physical Exam  BP 130/68 (BP Location: Left Arm, Patient Position: Sitting, Cuff Size: Normal)   Pulse 72   Wt 297 lb 9.6 oz (135 kg)   SpO2 95%   BMI 40.36 kg/m   GEN: A/Ox3; pleasant , NAD, well nourished    HEENT:  [place]/AT,  NOSE-clear, THROAT-clear, no lesions, no postnasal drip or exudate noted.  Class IV MP airway  NECK:  Supple w/ fair ROM; no JVD; normal carotid impulses w/o bruits; no thyromegaly or nodules palpated; no lymphadenopathy.    RESP  Clear  P & A; w/o, wheezes/ rales/ or rhonchi. no accessory muscle use, no dullness to percussion  CARD:  RRR, no m/r/g, no peripheral edema, pulses intact, no cyanosis or clubbing.  GI:   Soft & nt; nml bowel sounds; no organomegaly or masses detected.   Musco: Warm bil, no deformities or joint swelling noted.   Neuro: alert, no focal deficits noted.    Skin: Warm, no lesions or rashes    Lab Results:  CBC    Component Value Date/Time   WBC 11.2 (H) 04/09/2022 1643   RBC 4.82 04/09/2022 1643   HGB 12.6 (L) 04/09/2022 1643   HCT 38.3 (L) 04/09/2022 1643   PLT 289.0 04/09/2022 1643   MCV 79.5 04/09/2022 1643   MCH 26.5 11/16/2020 1456   MCHC 33.0 04/09/2022 1643   RDW 14.4 04/09/2022 1643   LYMPHSABS 3.6 04/09/2022 1643   MONOABS 1.0 04/09/2022 1643   EOSABS 0.4 04/09/2022 1643   BASOSABS 0.1 04/09/2022 1643    BMET    Component Value Date/Time   NA 140 06/06/2022 0854   K 4.3 06/06/2022 0854   CL 101 06/06/2022 0854   CO2 26 06/06/2022 0854   GLUCOSE 306 (H) 06/06/2022 0854   GLUCOSE 71  04/09/2022 1643   BUN 16 06/06/2022 0854   CREATININE 0.94 06/06/2022 0854   CREATININE 0.82 04/05/2011 0854   CALCIUM 8.8 06/06/2022 0854   GFRNONAA >60 11/16/2020 1456   GFRAA >60 05/12/2018 1404    BNP No results found for: "BNP"  ProBNP No results found for: "PROBNP"  Imaging: No results found.        No data to display          No results found for: "NITRICOXIDE"      Assessment & Plan:   OBSTRUCTIVE SLEEP APNEA Excellent control compliance on nocturnal CPAP. Continue on CPAP at bedtime.  Plan  Patient Instructions  Continue on CPAP at bedtime, wear all night long for at least 6 or more hours Keep up the good work Continue to work on [employer] Do not drive if sleepy Follow-up in 6 months and as needed with Dr. Elsworth Soho or Sharada Albornoz NP     Morbid obesity with BMI of 40.0-44.9, adult ([place]) Healthy weight loss discussed     Rexene Edison, NP 06/14/2022

## 2022-06-14 NOTE — Patient Instructions (Signed)
Continue on CPAP at bedtime, wear all night long for at least 6 or more hours Keep up the good work Continue to work on [employer] Do not drive if sleepy Follow-up in 6 months and as needed with Dr. Elsworth Soho or Royal Piedra NP

## 2022-06-14 NOTE — Assessment & Plan Note (Signed)
Healthy weight loss discussed.  

## 2022-06-14 NOTE — Assessment & Plan Note (Signed)
Excellent control compliance on nocturnal CPAP. Continue on CPAP at bedtime.  Plan  Patient Instructions  Continue on CPAP at bedtime, wear all night long for at least 6 or more hours Keep up the good work Continue to work on healthy weight Do not drive if sleepy Follow-up in 6 months and as needed with Dr. Elsworth Soho or Royal Piedra NP

## 2022-06-15 ENCOUNTER — Other Ambulatory Visit (HOSPITAL_COMMUNITY): Payer: Self-pay

## 2022-07-02 ENCOUNTER — Ambulatory Visit (HOSPITAL_COMMUNITY): Payer: 59 | Attending: Internal Medicine

## 2022-07-02 ENCOUNTER — Other Ambulatory Visit (HOSPITAL_COMMUNITY): Payer: Self-pay

## 2022-07-02 DIAGNOSIS — R0609 Other forms of dyspnea: Secondary | ICD-10-CM | POA: Insufficient documentation

## 2022-07-02 DIAGNOSIS — I2510 Atherosclerotic heart disease of native coronary artery without angina pectoris: Secondary | ICD-10-CM | POA: Insufficient documentation

## 2022-07-02 DIAGNOSIS — I2584 Coronary atherosclerosis due to calcified coronary lesion: Secondary | ICD-10-CM | POA: Diagnosis not present

## 2022-07-02 DIAGNOSIS — E7800 Pure hypercholesterolemia, unspecified: Secondary | ICD-10-CM | POA: Insufficient documentation

## 2022-07-02 DIAGNOSIS — Z6841 Body Mass Index (BMI) 40.0 and over, adult: Secondary | ICD-10-CM | POA: Insufficient documentation

## 2022-07-02 DIAGNOSIS — I2583 Coronary atherosclerosis due to lipid rich plaque: Secondary | ICD-10-CM | POA: Diagnosis not present

## 2022-07-02 LAB — ECHOCARDIOGRAM COMPLETE
Area-P 1/2: 3.51 cm2
S' Lateral: 2.8 cm

## 2022-07-02 MED ORDER — PERFLUTREN LIPID MICROSPHERE
3.0000 mL | INTRAVENOUS | Status: AC | PRN
  Administered 2022-07-02: 3 mL via INTRAVENOUS

## 2022-07-05 ENCOUNTER — Other Ambulatory Visit: Payer: Self-pay

## 2022-07-05 ENCOUNTER — Other Ambulatory Visit (HOSPITAL_COMMUNITY): Payer: Self-pay

## 2022-07-08 ENCOUNTER — Encounter: Payer: Self-pay | Admitting: *Deleted

## 2022-07-08 ENCOUNTER — Other Ambulatory Visit (HOSPITAL_COMMUNITY): Payer: Self-pay

## 2022-07-08 ENCOUNTER — Other Ambulatory Visit: Payer: Self-pay

## 2022-07-13 ENCOUNTER — Other Ambulatory Visit (HOSPITAL_COMMUNITY): Payer: Self-pay

## 2022-07-30 DIAGNOSIS — G4733 Obstructive sleep apnea (adult) (pediatric): Secondary | ICD-10-CM | POA: Diagnosis not present

## 2022-08-03 ENCOUNTER — Other Ambulatory Visit (HOSPITAL_COMMUNITY): Payer: Self-pay

## 2022-08-07 DIAGNOSIS — G4733 Obstructive sleep apnea (adult) (pediatric): Secondary | ICD-10-CM | POA: Diagnosis not present

## 2022-08-08 DIAGNOSIS — E119 Type 2 diabetes mellitus without complications: Secondary | ICD-10-CM | POA: Diagnosis not present

## 2022-08-14 DIAGNOSIS — H5203 Hypermetropia, bilateral: Secondary | ICD-10-CM | POA: Diagnosis not present

## 2022-08-14 DIAGNOSIS — H52221 Regular astigmatism, right eye: Secondary | ICD-10-CM | POA: Diagnosis not present

## 2022-08-15 ENCOUNTER — Other Ambulatory Visit: Payer: Self-pay

## 2022-08-15 ENCOUNTER — Other Ambulatory Visit (HOSPITAL_COMMUNITY): Payer: Self-pay

## 2022-08-20 ENCOUNTER — Other Ambulatory Visit (HOSPITAL_COMMUNITY): Payer: Self-pay

## 2022-08-28 DIAGNOSIS — G4733 Obstructive sleep apnea (adult) (pediatric): Secondary | ICD-10-CM | POA: Diagnosis not present

## 2022-09-03 ENCOUNTER — Other Ambulatory Visit: Payer: Self-pay

## 2022-09-04 ENCOUNTER — Other Ambulatory Visit (HOSPITAL_COMMUNITY): Payer: Self-pay

## 2022-09-05 ENCOUNTER — Other Ambulatory Visit (HOSPITAL_COMMUNITY): Payer: Self-pay

## 2022-09-05 MED ORDER — METFORMIN HCL 1000 MG PO TABS
1000.0000 mg | ORAL_TABLET | Freq: Two times a day (BID) | ORAL | 4 refills | Status: DC
  Filled 2022-09-05: qty 180, 90d supply, fill #0
  Filled 2022-11-25: qty 180, 90d supply, fill #1
  Filled 2023-02-23: qty 180, 90d supply, fill #2
  Filled 2023-06-01: qty 180, 90d supply, fill #3
  Filled 2023-09-01: qty 180, 90d supply, fill #4

## 2022-09-09 ENCOUNTER — Other Ambulatory Visit (HOSPITAL_COMMUNITY): Payer: Self-pay

## 2022-09-28 DIAGNOSIS — G4733 Obstructive sleep apnea (adult) (pediatric): Secondary | ICD-10-CM | POA: Diagnosis not present

## 2022-09-30 ENCOUNTER — Other Ambulatory Visit (HOSPITAL_COMMUNITY): Payer: Self-pay

## 2022-10-08 ENCOUNTER — Other Ambulatory Visit (HOSPITAL_COMMUNITY): Payer: Self-pay

## 2022-10-09 ENCOUNTER — Other Ambulatory Visit (HOSPITAL_COMMUNITY): Payer: Self-pay

## 2022-10-09 MED ORDER — DEXCOM G7 SENSOR MISC
3 refills | Status: DC
  Filled 2022-10-09: qty 9, 90d supply, fill #0
  Filled 2022-12-27: qty 9, 90d supply, fill #1
  Filled 2023-03-24: qty 3, 30d supply, fill #2
  Filled 2023-05-03: qty 3, 30d supply, fill #3
  Filled 2023-06-20: qty 3, 30d supply, fill #4
  Filled 2023-07-25: qty 3, 30d supply, fill #5
  Filled 2023-08-28: qty 3, 30d supply, fill #6
  Filled 2023-09-26: qty 3, 30d supply, fill #7

## 2022-10-10 ENCOUNTER — Other Ambulatory Visit (HOSPITAL_COMMUNITY): Payer: Self-pay

## 2022-10-15 ENCOUNTER — Other Ambulatory Visit: Payer: Self-pay

## 2022-10-15 ENCOUNTER — Other Ambulatory Visit (HOSPITAL_COMMUNITY): Payer: Self-pay

## 2022-10-28 DIAGNOSIS — G4733 Obstructive sleep apnea (adult) (pediatric): Secondary | ICD-10-CM | POA: Diagnosis not present

## 2022-10-29 ENCOUNTER — Other Ambulatory Visit (HOSPITAL_COMMUNITY): Payer: Self-pay

## 2022-10-30 ENCOUNTER — Other Ambulatory Visit (HOSPITAL_COMMUNITY): Payer: Self-pay

## 2022-10-31 ENCOUNTER — Other Ambulatory Visit: Payer: Self-pay | Admitting: Internal Medicine

## 2022-10-31 ENCOUNTER — Other Ambulatory Visit: Payer: Self-pay

## 2022-11-01 ENCOUNTER — Other Ambulatory Visit: Payer: Self-pay | Admitting: *Deleted

## 2022-11-01 ENCOUNTER — Other Ambulatory Visit (HOSPITAL_COMMUNITY): Payer: Self-pay

## 2022-11-01 MED ORDER — DOXAZOSIN MESYLATE 8 MG PO TABS
4.0000 mg | ORAL_TABLET | Freq: Every day | ORAL | 0 refills | Status: DC
  Filled 2022-11-01: qty 90, 90d supply, fill #0

## 2022-11-01 MED ORDER — CARVEDILOL 6.25 MG PO TABS
6.2500 mg | ORAL_TABLET | Freq: Two times a day (BID) | ORAL | 0 refills | Status: DC
  Filled 2022-11-01: qty 180, 90d supply, fill #0

## 2022-11-08 ENCOUNTER — Other Ambulatory Visit (HOSPITAL_COMMUNITY): Payer: Self-pay

## 2022-11-11 ENCOUNTER — Other Ambulatory Visit (HOSPITAL_COMMUNITY): Payer: Self-pay

## 2022-11-28 DIAGNOSIS — G4733 Obstructive sleep apnea (adult) (pediatric): Secondary | ICD-10-CM | POA: Diagnosis not present

## 2022-11-29 ENCOUNTER — Other Ambulatory Visit (HOSPITAL_COMMUNITY): Payer: Self-pay

## 2022-11-29 ENCOUNTER — Ambulatory Visit: Payer: 59 | Admitting: Nurse Practitioner

## 2022-11-29 VITALS — BP 126/64 | HR 61 | Temp 98.3°F | Ht 70.0 in | Wt 288.5 lb

## 2022-11-29 DIAGNOSIS — R519 Headache, unspecified: Secondary | ICD-10-CM | POA: Diagnosis not present

## 2022-11-29 DIAGNOSIS — F339 Major depressive disorder, recurrent, unspecified: Secondary | ICD-10-CM

## 2022-11-29 LAB — COMPREHENSIVE METABOLIC PANEL
ALT: 31 U/L (ref 0–53)
AST: 24 U/L (ref 0–37)
Albumin: 4.4 g/dL (ref 3.5–5.2)
Alkaline Phosphatase: 47 U/L (ref 39–117)
BUN: 19 mg/dL (ref 6–23)
CO2: 30 mEq/L (ref 19–32)
Calcium: 9.4 mg/dL (ref 8.4–10.5)
Chloride: 102 mEq/L (ref 96–112)
Creatinine, Ser: 0.96 mg/dL (ref 0.40–1.50)
GFR: 89.03 mL/min (ref >60.00)
Glucose, Bld: 155 mg/dL — ABNORMAL HIGH (ref 70–99)
Potassium: 3.8 mEq/L (ref 3.5–5.1)
Sodium: 138 mEq/L (ref 135–145)
Total Bilirubin: 0.7 mg/dL (ref 0.2–1.2)
Total Protein: 7.6 g/dL (ref 6.0–8.3)

## 2022-11-29 LAB — CBC
HCT: 38.7 % — ABNORMAL LOW (ref 39.0–52.0)
Hemoglobin: 12.9 g/dL — ABNORMAL LOW (ref 13.0–17.0)
MCHC: 33.4 g/dL (ref 30.0–36.0)
MCV: 81.8 fl (ref 78.0–100.0)
Platelets: 262 10*3/uL (ref 150.0–400.0)
RBC: 4.73 Mil/uL (ref 4.22–5.81)
RDW: 14.8 % (ref 11.5–15.5)
WBC: 8 10*3/uL (ref 4.0–10.5)

## 2022-11-29 MED ORDER — BUPROPION HCL ER (XL) 150 MG PO TB24
150.0000 mg | ORAL_TABLET | Freq: Every day | ORAL | 1 refills | Status: DC
Start: 2022-11-29 — End: 2023-04-11
  Filled 2022-11-29: qty 30, 30d supply, fill #0

## 2022-11-29 NOTE — Progress Notes (Signed)
Established Patient Office Visit  Subjective   Patient ID: Victor Byrd, male    DOB: 1965/12/03  Age: 57 y.o. MRN: [id number]  Chief Complaint  Patient presents with   Headache    Been going for couple months, shooting and throbbing pain, happens after work hours, use Claritin which has no helped    Patient has today for acute visit for the above.  He normally sees Dr. Posey Rea as his primary care provider.  Reports that has been having frequent headaches for the last 2 to 3 months.  They are usually frontal, respond to use of naproxen.  7-8/10 in intensity and range from dull to sharp.  He has obstructive sleep apnea, obesity, diabetes.  He uses CPAP and last time saw pulmonologist they reported that after reviewing download he is using his CPAP at 100% compliance and that he should continue using current settings.  He reports that his sleep is choppy and he usually gets 3 to 4 hours consecutively at a time, will often need to take a nap during the day, does have significant daytime fatigue and sleepiness.  Would prefer not to take medication to assist with sleep due to worry of feeling groggy the next day which may affect his work. He has recently changed jobs which has affected his schedule and ability to come to doctor offices so he is not always checking in with his endocrinologist routinely.  He is experiencing significant stress, depression, and anxiety related to interpersonal relationships in his life.  Specifically related to the relationship between him and his wife as well as him and his daughter.  He is currently in counseling and takes Wellbutrin 300mg /day.  Was also on citalopram in the past but discontinued this due to sexual dysfunction side effects.  He denies suicidal ideation today.      Review of Systems  Constitutional:  Positive for malaise/fatigue.  Eyes:  Positive for photophobia. Negative for blurred vision and double vision.  Gastrointestinal:  Positive for  nausea. Negative for vomiting.  Neurological:  Positive for headaches. Negative for sensory change and weakness.      Objective:     BP 126/64   Pulse 61   Temp 98.3 F (36.8 C) (Temporal)   Ht 5\' 10"  (1.778 m)   Wt 288 lb 8 oz (130.9 kg)   SpO2 98%   BMI 41.40 kg/m       01/14/2022    4:26 PM 07/23/2021    9:17 AM 04/16/2021    9:37 AM  PHQ9 SCORE ONLY  PHQ-9 Total Score 16 15 1      Physical Exam Vitals reviewed.  Constitutional:      Appearance: Normal appearance.  HENT:     Head: Normocephalic and atraumatic.  Eyes:     Pupils: Pupils are equal, round, and reactive to light.  Cardiovascular:     Rate and Rhythm: Normal rate and regular rhythm.  Pulmonary:     Effort: Pulmonary effort is normal.     Breath sounds: Normal breath sounds.  Musculoskeletal:     Cervical back: Neck supple.  Skin:    General: Skin is warm and dry.  Neurological:     Mental Status: He is alert and oriented to person, place, and time.     Cranial Nerves: Cranial nerves 2-12 are intact.     Sensory: Sensation is intact.     Motor: Motor function is intact.     Coordination: Coordination is intact.  Gait: Gait is intact.  Psychiatric:        Mood and Affect: Mood normal.        Behavior: Behavior normal.        Thought Content: Thought content normal.        Judgment: Judgment normal.      Results for orders placed or performed in visit on 11/29/22  Comprehensive metabolic panel  Result Value Ref Range   Sodium 138 135 - 145 mEq/L   Potassium 3.8 3.5 - 5.1 mEq/L   Chloride 102 96 - 112 mEq/L   CO2 30 19 - 32 mEq/L   Glucose, Bld 155 (H) 70 - 99 mg/dL   BUN 19 6 - 23 mg/dL   Creatinine, Ser 1.61 0.40 - 1.50 mg/dL   Total Bilirubin 0.7 0.2 - 1.2 mg/dL   Alkaline Phosphatase 47 39 - 117 U/L   AST 24 0 - 37 U/L   ALT 31 0 - 53 U/L   Total Protein 7.6 6.0 - 8.3 g/dL   Albumin 4.4 3.5 - 5.2 g/dL   GFR 09.60 >45.40 mL/min   Calcium 9.4 8.4 - 10.5 mg/dL  CBC  Result  Value Ref Range   WBC 8.0 4.0 - 10.5 K/uL   RBC 4.73 4.22 - 5.81 Mil/uL   Platelets 262.0 150.0 - 400.0 K/uL   Hemoglobin 12.9 (L) 13.0 - 17.0 g/dL   HCT 98.1 (L) 19.1 - 47.8 %   MCV 81.8 78.0 - 100.0 fl   MCHC 33.4 30.0 - 36.0 g/dL   RDW 29.5 62.1 - 30.8 %      The ASCVD Risk score (Arnett DK, et al., 2019) failed to calculate for the following reasons:   The valid total cholesterol range is 130 to 320 mg/dL    Assessment & Plan:   Problem List Items Addressed This Visit       Other   Depression, major    Chronic, not well-controlled currently.  Patient experiencing a lot of stressors in his life right now.  Per shared decision making will increase Wellbutrin to 450 mg/day, patient to follow-up with either myself or his primary care in 6 weeks to monitor.  He was educated regarding risk for suicidal ideation and to call this office if this were to occur. He was encouraged to continue counseling, declined referral to psychiatry      Relevant Medications   buPROPion (WELLBUTRIN XL) 150 MG 24 hr tablet   Nonintractable headache - Primary    Intermittent, chronic I think this may be related to lack of sleep, stress, and anxiety/depression. No neurologic deficits noted on exam however because this is a new headache and is occurring almost every day and he is over the age of 89 will order MRI brain for further evaluation.  Also order CBC and CMP for further evaluation. He reported that he has been told not to use ibuprofen due to kidney function, so I recommended he also discontinue naproxen and instead use Tylenol as needed to manage his headache but not to exceed 3000 mg in 24-hour.. We discussed sleep hygiene practices and he was encouraged to continue using his CPAP. See plan for depression regarding medication adjustments for mood.      Relevant Medications   buPROPion (WELLBUTRIN XL) 150 MG 24 hr tablet   Other Relevant Orders   MR Brain Wo Contrast   CBC (Completed)    Comprehensive metabolic panel (Completed)    Return in about 6 weeks (around 01/10/2023)  for f/u with Dr. Macario Golds or Maralyn Sago for headache and depression.  Total time spent on office visit today was 64 minutes which included face-to-face evaluation, review of previous records, long discussion regarding treatment options and had shared decision-making discussion regarding treatment decisions.   Elenore Paddy, NP

## 2022-11-29 NOTE — Assessment & Plan Note (Signed)
Intermittent, chronic I think this may be related to lack of sleep, stress, and anxiety/depression. No neurologic deficits noted on exam however because this is a new headache and is occurring almost every day and he is over the age of 40 will order MRI brain for further evaluation.  Also order CBC and CMP for further evaluation. He reported that he has been told not to use ibuprofen due to kidney function, so I recommended he also discontinue naproxen and instead use Tylenol as needed to manage his headache but not to exceed 3000 mg in 24-hour.. We discussed sleep hygiene practices and he was encouraged to continue using his CPAP. See plan for depression regarding medication adjustments for mood.

## 2022-11-29 NOTE — Patient Instructions (Addendum)
Tylenol 500-1000mg  by mouth every 8 hours as needed. Do not exceed 3000mg  in 24 hours

## 2022-11-29 NOTE — Assessment & Plan Note (Addendum)
Chronic, not well-controlled currently.  Patient experiencing a lot of stressors in his life right now.  Per shared decision making will increase Wellbutrin to 450 mg/day, patient to follow-up with either myself or his primary care in 6 weeks to monitor.  He was educated regarding risk for suicidal ideation and to call this office if this were to occur. He was encouraged to continue counseling, declined referral to psychiatry

## 2022-12-03 ENCOUNTER — Other Ambulatory Visit: Payer: Self-pay | Admitting: Cardiology

## 2022-12-04 ENCOUNTER — Other Ambulatory Visit: Payer: Self-pay

## 2022-12-04 ENCOUNTER — Other Ambulatory Visit (HOSPITAL_COMMUNITY): Payer: Self-pay

## 2022-12-04 MED ORDER — TELMISARTAN-HCTZ 80-25 MG PO TABS
1.0000 | ORAL_TABLET | Freq: Every day | ORAL | 0 refills | Status: DC
  Filled 2022-12-04: qty 90, 90d supply, fill #0

## 2022-12-11 ENCOUNTER — Other Ambulatory Visit: Payer: Self-pay | Admitting: Internal Medicine

## 2022-12-12 ENCOUNTER — Other Ambulatory Visit (HOSPITAL_COMMUNITY): Payer: Self-pay

## 2022-12-12 MED ORDER — CLONIDINE HCL 0.2 MG PO TABS
0.2000 mg | ORAL_TABLET | Freq: Two times a day (BID) | ORAL | 0 refills | Status: DC
  Filled 2022-12-12: qty 60, 30d supply, fill #0

## 2022-12-13 ENCOUNTER — Other Ambulatory Visit (HOSPITAL_COMMUNITY): Payer: Self-pay

## 2022-12-27 ENCOUNTER — Ambulatory Visit
Admission: RE | Admit: 2022-12-27 | Discharge: 2022-12-27 | Disposition: A | Discharge status: Home / Self Care | Payer: 59 | Source: Ambulatory Visit | Attending: Nurse Practitioner | Admitting: Nurse Practitioner

## 2022-12-27 ENCOUNTER — Other Ambulatory Visit (HOSPITAL_COMMUNITY): Payer: Self-pay

## 2022-12-27 DIAGNOSIS — R519 Headache, unspecified: Secondary | ICD-10-CM

## 2022-12-28 DIAGNOSIS — G4733 Obstructive sleep apnea (adult) (pediatric): Secondary | ICD-10-CM | POA: Diagnosis not present

## 2023-01-03 ENCOUNTER — Other Ambulatory Visit (HOSPITAL_COMMUNITY): Payer: Self-pay

## 2023-01-03 DIAGNOSIS — I10 Essential (primary) hypertension: Secondary | ICD-10-CM | POA: Diagnosis not present

## 2023-01-03 DIAGNOSIS — E1165 Type 2 diabetes mellitus with hyperglycemia: Secondary | ICD-10-CM | POA: Diagnosis not present

## 2023-01-03 DIAGNOSIS — R809 Proteinuria, unspecified: Secondary | ICD-10-CM | POA: Diagnosis not present

## 2023-01-03 DIAGNOSIS — G609 Hereditary and idiopathic neuropathy, unspecified: Secondary | ICD-10-CM | POA: Diagnosis not present

## 2023-01-03 MED ORDER — OZEMPIC (0.25 OR 0.5 MG/DOSE) 2 MG/3ML ~~LOC~~ SOPN
0.5000 mg | PEN_INJECTOR | SUBCUTANEOUS | 5 refills | Status: DC
  Filled 2023-01-03: qty 3, 28d supply, fill #0
  Filled 2023-02-03: qty 3, 28d supply, fill #1
  Filled 2023-03-02: qty 3, 28d supply, fill #2
  Filled 2023-04-17: qty 3, 28d supply, fill #3
  Filled 2023-05-17: qty 3, 28d supply, fill #4
  Filled 2023-06-22: qty 3, 28d supply, fill #5

## 2023-01-03 MED ORDER — INSULIN GLARGINE-YFGN 100 UNIT/ML ~~LOC~~ SOPN
50.0000 [IU] | PEN_INJECTOR | Freq: Two times a day (BID) | SUBCUTANEOUS | 5 refills | Status: DC
  Filled 2023-01-03: qty 30, 30d supply, fill #0

## 2023-01-08 ENCOUNTER — Other Ambulatory Visit (HOSPITAL_COMMUNITY): Payer: Self-pay

## 2023-01-13 ENCOUNTER — Ambulatory Visit (INDEPENDENT_AMBULATORY_CARE_PROVIDER_SITE_OTHER): Payer: 59 | Admitting: Internal Medicine

## 2023-01-13 ENCOUNTER — Encounter: Payer: Self-pay | Admitting: Internal Medicine

## 2023-01-13 ENCOUNTER — Other Ambulatory Visit: Payer: Self-pay | Admitting: Internal Medicine

## 2023-01-13 VITALS — BP 120/70 | HR 80 | Temp 98.0°F | Ht 70.0 in | Wt 277.0 lb

## 2023-01-13 DIAGNOSIS — Z0000 Encounter for general adult medical examination without abnormal findings: Secondary | ICD-10-CM | POA: Diagnosis not present

## 2023-01-13 DIAGNOSIS — Z7985 Long-term (current) use of injectable non-insulin antidiabetic drugs: Secondary | ICD-10-CM | POA: Diagnosis not present

## 2023-01-13 DIAGNOSIS — E1169 Type 2 diabetes mellitus with other specified complication: Secondary | ICD-10-CM

## 2023-01-13 DIAGNOSIS — E0590 Thyrotoxicosis, unspecified without thyrotoxic crisis or storm: Secondary | ICD-10-CM

## 2023-01-13 DIAGNOSIS — E669 Obesity, unspecified: Secondary | ICD-10-CM | POA: Diagnosis not present

## 2023-01-13 DIAGNOSIS — Z6841 Body Mass Index (BMI) 40.0 and over, adult: Secondary | ICD-10-CM

## 2023-01-13 DIAGNOSIS — E291 Testicular hypofunction: Secondary | ICD-10-CM | POA: Diagnosis not present

## 2023-01-13 NOTE — Progress Notes (Signed)
Subjective:  Patient ID: Victor Byrd, male    DOB: Nov 19, 1965  Age: 57 y.o. MRN: [id number]  CC: Follow-up (6 WEEK F/U)   HPI Victor Byrd presents for a well exam F/u on fatigue, HAs, DM Starting work at 4 am Stress at home  Outpatient Medications Prior to Visit  Medication Sig Dispense Refill   albuterol (VENTOLIN HFA) 108 (90 Base) MCG/ACT inhaler Inhale 2 puffs into the lungs every 6 (six) hours as needed for wheezing 6.7 g 1   amLODipine (NORVASC) 10 MG tablet Take 1 tablet (10 mg total) by mouth daily. 30 tablet 11   aspirin 81 MG tablet Take 81 mg by mouth daily.     atorvastatin (LIPITOR) 20 MG tablet Take 1 tablet (20 mg total) by mouth daily. 90 tablet 3   buPROPion (WELLBUTRIN XL) 150 MG 24 hr tablet Take 1 tablet (150 mg total) by mouth daily. 30 tablet 1   buPROPion (WELLBUTRIN XL) 300 MG 24 hr tablet Take 1 tablet (300 mg total) by mouth daily. 90 tablet 3   carvedilol (COREG) 6.25 MG tablet Take 1 tablet by mouth 2  times daily with a meal. 180 tablet 0   Continuous Blood Gluc Sensor (DEXCOM G7 SENSOR) MISC Replace q 10 days 3 each 11   Continuous Blood Gluc Sensor (DEXCOM G7 SENSOR) MISC Change sensor every 10 days 3 each 11   Continuous Blood Gluc Sensor (DEXCOM G7 SENSOR) MISC Use as directed every 10 days 3 each 6   Continuous Glucose Sensor (DEXCOM G7 SENSOR) MISC Change sensor every 10 days 9 each 3   doxazosin (CARDURA) 8 MG tablet Take 0.5-1 tablets (4-8 mg total) by mouth daily. Annual appt due in Aug must see provider for future refills 90 tablet 0   furosemide (LASIX) 20 MG tablet Take 1 - 2 tablets (20 - 40 mg total) by mouth daily as needed. 180 tablet 3   Ginkgo Biloba 40 MG TABS Take by mouth.     glimepiride (AMARYL) 4 MG tablet Take 1 tablet (4 mg total) by mouth 2 (two) times daily. 180 tablet 4   glucose blood test strip Use to test blood sugar up to three times daily as needed. 100 each 6   insulin glargine-yfgn (SEMGLEE, YFGN,) 100 UNIT/ML Pen  Inject 30 units under the skin twice a day 45 mL 3   insulin glargine-yfgn (SEMGLEE, YFGN,) 100 UNIT/ML Pen Inject 60 Units into the skin every morning use 50 Units 2 times daily. 15 mL 0   insulin glargine-yfgn (SEMGLEE, YFGN,) 100 UNIT/ML Pen Inject 60 units into the skin every morning and 50 units in the evening 36 mL 11   insulin glargine-yfgn (SEMGLEE, YFGN,) 100 UNIT/ML Pen Inject 50 Units into the skin 2 (two) times daily. 30 mL 5   Lancets (FREESTYLE) lancets Use as directed to test blood sugar up to 3 times a day as needed. 100 each 6   magnesium 30 MG tablet Take 30 mg by mouth daily.     metFORMIN (GLUCOPHAGE) 1000 MG tablet Take 1 tablet by mouth 2 times a day 180 tablet 4   Multiple Vitamin (MULTIVITAMIN WITH MINERALS) TABS tablet Take 1 tablet by mouth daily.     NEEDLE, DISP, 23 G (B-D DISP NEEDLE 23GX1-1/4") 23G X 1-1/4" MISC Use for testosterone injections 50 each 0   Omega-3 Fatty Acids (FISH OIL) 1000 MG CAPS 1 capsule     Semaglutide (RYBELSUS) 14 MG TABS Take 1  tablet once daily at least 30 minutes before first food, beverage or other oral medicine of the day 30 tablet 5   Semaglutide (RYBELSUS) 7 MG TABS Take 1 tablet by mouth daily with 4 oz of water on an empty stomach 30 minutes before breakfast or other meds 30 tablet 11   Semaglutide,0.25 or 0.5MG /DOS, (OZEMPIC, 0.25 OR 0.5 MG/DOSE,) 2 MG/3ML SOPN Inject 0.5 mg into the skin every 7 (seven) days. 3 mL 5   tadalafil (CIALIS) 20 MG tablet Take 1 tablet (20 mg total) by mouth daily as needed for erectile dysfunction. 10 tablet 3   telmisartan-hydrochlorothiazide (MICARDIS HCT) 80-25 MG tablet Take 1 tablet by mouth daily. Pt needs to schedule appt for further refills. 90 tablet 0   testosterone cypionate (DEPOTESTOSTERONE CYPIONATE) 200 MG/ML injection Inject 1 mL (200 mg total) into the muscle every 14 days. 10 mL 1   cloNIDine (CATAPRES) 0.2 MG tablet Take 1 tablet (0.2 mg total) by mouth 2 (two) times daily. Annual appt due  in Aug must see provider for future refills 60 tablet 0   No facility-administered medications prior to visit.    ROS: Review of Systems  Constitutional:  Positive for fatigue. Negative for appetite change and unexpected weight change.  HENT:  Negative for congestion, nosebleeds, sneezing, sore throat and trouble swallowing.   Eyes:  Negative for itching and visual disturbance.  Respiratory:  Negative for cough.   Cardiovascular:  Negative for chest pain, palpitations and leg swelling.  Gastrointestinal:  Negative for abdominal distention, blood in stool, diarrhea and nausea.  Genitourinary:  Negative for frequency and hematuria.  Musculoskeletal:  Negative for back pain, gait problem, joint swelling and neck pain.  Skin:  Negative for rash.  Neurological:  Positive for weakness and headaches. Negative for dizziness, tremors, speech difficulty and light-headedness.  Psychiatric/Behavioral:  Positive for decreased concentration and dysphoric mood. Negative for agitation, sleep disturbance and suicidal ideas. The patient is not nervous/anxious.     Objective:  BP 120/70 (BP Location: Left Arm, Patient Position: Sitting, Cuff Size: Large)   Pulse 80   Temp 98 F (36.7 C) (Oral)   Ht 5\' 10"  (1.778 m)   Wt 277 lb (125.6 kg)   SpO2 95%   BMI 39.75 kg/m   BP Readings from Last 3 Encounters:  01/13/23 120/70  11/29/22 126/64  06/14/22 130/68    Wt Readings from Last 3 Encounters:  01/13/23 277 lb (125.6 kg)  11/29/22 288 lb 8 oz (130.9 kg)  06/14/22 297 lb 9.6 oz (135 kg)    Physical Exam Constitutional:      General: He is not in acute distress.    Appearance: He is well-developed. He is obese.     Comments: NAD  Eyes:     Conjunctiva/sclera: Conjunctivae normal.     Pupils: Pupils are equal, round, and reactive to light.  Neck:     Thyroid: No thyromegaly.     Vascular: No JVD.  Cardiovascular:     Rate and Rhythm: Normal rate and regular rhythm.     Heart sounds:  Normal heart sounds. No murmur heard.    No friction rub. No gallop.  Pulmonary:     Effort: Pulmonary effort is normal. No respiratory distress.     Breath sounds: Normal breath sounds. No wheezing or rales.  Chest:     Chest wall: No tenderness.  Abdominal:     General: Bowel sounds are normal. There is no distension.  Palpations: Abdomen is soft. There is no mass.     Tenderness: There is no abdominal tenderness. There is no guarding or rebound.  Musculoskeletal:        General: Tenderness present. Normal range of motion.     Cervical back: Normal range of motion.  Lymphadenopathy:     Cervical: No cervical adenopathy.  Skin:    General: Skin is warm and dry.     Findings: No rash.  Neurological:     Mental Status: He is alert and oriented to person, place, and time.     Cranial Nerves: No cranial nerve deficit.     Motor: No abnormal muscle tone.     Coordination: Coordination normal.     Gait: Gait normal.     Deep Tendon Reflexes: Reflexes are normal and symmetric.  Psychiatric:        Behavior: Behavior normal.        Thought Content: Thought content normal.        Judgment: Judgment normal.     Lab Results  Component Value Date   WBC 9.3 01/17/2023   HGB 13.3 01/17/2023   HCT 40.7 01/17/2023   PLT 302.0 01/17/2023   GLUCOSE 173 (H) 01/17/2023   CHOL 102 01/17/2023   TRIG 92.0 01/17/2023   HDL 33.40 (L) 01/17/2023   LDLDIRECT 96.0 01/11/2022   LDLCALC 50 01/17/2023   ALT 25 01/17/2023   ALT 25 01/17/2023   AST 20 01/17/2023   AST 20 01/17/2023   NA 135 01/17/2023   K 3.8 01/17/2023   CL 98 01/17/2023   CREATININE 0.93 01/17/2023   BUN 19 01/17/2023   CO2 31 01/17/2023   TSH 1.89 01/17/2023   PSA 1.65 01/17/2023   HGBA1C 7.8 (H) 01/17/2023   MICROALBUR 3.4 (H) 01/11/2022    MR Brain Wo Contrast  Result Date: 12/27/2022 CLINICAL DATA:  Provided history: Headache, new onset. Non-intractable headache, unspecified chronicity pattern, unspecified  headache type. EXAM: MRI HEAD WITHOUT CONTRAST TECHNIQUE: Multiplanar, multiecho pulse sequences of the brain and surrounding structures were obtained without intravenous contrast. COMPARISON:  Head CT 11/16/2020. FINDINGS: Brain: Cerebral volume is normal. No cortical encephalomalacia is identified. No significant cerebral white matter disease. There is no acute infarct. No evidence of an intracranial mass. No chronic intracranial blood products. No extra-axial fluid collection. No midline shift. Vascular: Maintained flow voids within the proximal large arterial vessels. Skull and upper cervical spine: No focal suspicious marrow lesion. Sinuses/Orbits: No mass or acute finding within the imaged orbits. Extensive T2 hyperintense opacification of the left frontal sinus. T2 hyperintense opacification of anterior left ethmoid air cells, moderate in severity. Mild mucosal thickening scattered within anterior right ethmoid air cells. IMPRESSION: 1. Unremarkable non-contrast MRI appearance of the brain. No evidence of an acute intracranial abnormality. 2. Paranasal sinus disease as described. Electronically Signed   By: Jackey Loge D.O.   On: 12/27/2022 08:19    Assessment & Plan:   Problem List Items Addressed This Visit     Thyrotoxicosis    F/u w/Dr Talmage Nap.       Type 2 diabetes mellitus with obesity (HCC) - Primary    On Ozempic. Pt lost wt      Relevant Orders   Hemoglobin A1c (Completed)   Morbid obesity with BMI of 40.0-44.9, adult (HCC)    F/u w/Dr Talmage Nap. Pt lost wt      Well adult exam     We discussed age appropriate health related issues, including available/recomended  screening tests and vaccinations. Labs were ordered to be later reviewed . All questions were answered. We discussed one or more of the following - seat belt use, use of sunscreen/sun exposure exercise, safe sex, fall risk reduction, second hand smoke exposure, firearm use and storage, seat belt use, a need for adhering to  healthy diet and exercise. Labs were ordered.  All questions were answered. Health risks of DM, CAD, obesity and depression discussed... Cardiac CT scan for calcium scoring offered 2020, 2022  Last Colonoscopy 2022 w/Dr Armbruster (polyps)       Relevant Orders   TSH (Completed)   Urinalysis (Completed)   CBC with Differential/Platelet (Completed)   Lipid panel (Completed)   PSA (Completed)   Hepatic function panel (Completed)   Comprehensive metabolic panel (Completed)   Hypogonadism in male   Relevant Orders   Testosterone (Completed)   Class 3 severe obesity due to excess calories without serious comorbidity with body mass index (BMI) of 40.0 to 44.9 in adult [facility])    F/u w/Dr Talmage Nap. On Ozempic 01/2023         No orders of the defined types were placed in this encounter.     Follow-up: Return in about 3 months (around 04/15/2023) for a follow-up visit.  Sonda Primes, MD

## 2023-01-13 NOTE — Assessment & Plan Note (Signed)
F/u w/Dr Balan 

## 2023-01-13 NOTE — Assessment & Plan Note (Signed)
F/u w/Dr Talmage Nap. On Ozempic 01/2023

## 2023-01-13 NOTE — Assessment & Plan Note (Addendum)
On Ozempic. Pt lost wt

## 2023-01-13 NOTE — Assessment & Plan Note (Signed)
F/u w/Dr Talmage Nap. Pt lost wt

## 2023-01-13 NOTE — Assessment & Plan Note (Signed)
  We discussed age appropriate health related issues, including available/recomended screening tests and vaccinations. Labs were ordered to be later reviewed . All questions were answered. We discussed one or more of the following - seat belt use, use of sunscreen/sun exposure exercise, safe sex, fall risk reduction, second hand smoke exposure, firearm use and storage, seat belt use, a need for adhering to healthy diet and exercise. Labs were ordered.  All questions were answered. Health risks of DM, CAD, obesity and depression discussed... Cardiac CT scan for calcium scoring offered 2020, 2022  Last Colonoscopy 2022 w/Dr Armbruster (polyps)

## 2023-01-14 ENCOUNTER — Other Ambulatory Visit (HOSPITAL_COMMUNITY): Payer: Self-pay

## 2023-01-14 ENCOUNTER — Other Ambulatory Visit: Payer: Self-pay

## 2023-01-14 MED ORDER — CLONIDINE HCL 0.2 MG PO TABS
0.2000 mg | ORAL_TABLET | Freq: Two times a day (BID) | ORAL | 5 refills | Status: DC
  Filled 2023-01-14: qty 60, 30d supply, fill #0
  Filled 2023-02-15: qty 60, 30d supply, fill #1
  Filled 2023-03-19: qty 60, 30d supply, fill #2
  Filled 2023-04-21: qty 60, 30d supply, fill #3
  Filled 2023-05-24: qty 60, 30d supply, fill #4
  Filled 2023-07-29: qty 60, 30d supply, fill #5

## 2023-01-17 ENCOUNTER — Other Ambulatory Visit (INDEPENDENT_AMBULATORY_CARE_PROVIDER_SITE_OTHER): Payer: 59

## 2023-01-17 DIAGNOSIS — E291 Testicular hypofunction: Secondary | ICD-10-CM | POA: Diagnosis not present

## 2023-01-17 DIAGNOSIS — E1169 Type 2 diabetes mellitus with other specified complication: Secondary | ICD-10-CM

## 2023-01-17 DIAGNOSIS — Z13220 Encounter for screening for lipoid disorders: Secondary | ICD-10-CM

## 2023-01-17 DIAGNOSIS — Z0000 Encounter for general adult medical examination without abnormal findings: Secondary | ICD-10-CM | POA: Diagnosis not present

## 2023-01-17 DIAGNOSIS — E669 Obesity, unspecified: Secondary | ICD-10-CM | POA: Diagnosis not present

## 2023-01-17 DIAGNOSIS — Z125 Encounter for screening for malignant neoplasm of prostate: Secondary | ICD-10-CM

## 2023-01-17 LAB — LIPID PANEL
Cholesterol: 102 mg/dL (ref 0–200)
HDL: 33.4 mg/dL — ABNORMAL LOW (ref >39.00)
LDL Cholesterol: 50 mg/dL (ref 0–99)
NonHDL: 68.14
Total CHOL/HDL Ratio: 3
Triglycerides: 92 mg/dL (ref 0.0–149.0)
VLDL: 18.4 mg/dL (ref 0.0–40.0)

## 2023-01-17 LAB — COMPREHENSIVE METABOLIC PANEL
ALT: 25 U/L (ref 0–53)
AST: 20 U/L (ref 0–37)
Albumin: 4.4 g/dL (ref 3.5–5.2)
Alkaline Phosphatase: 49 U/L (ref 39–117)
BUN: 19 mg/dL (ref 6–23)
CO2: 31 mEq/L (ref 19–32)
Calcium: 9.6 mg/dL (ref 8.4–10.5)
Chloride: 98 mEq/L (ref 96–112)
Creatinine, Ser: 0.93 mg/dL (ref 0.40–1.50)
GFR: 92.4 mL/min (ref >60.00)
Glucose, Bld: 173 mg/dL — ABNORMAL HIGH (ref 70–99)
Potassium: 3.8 mEq/L (ref 3.5–5.1)
Sodium: 135 mEq/L (ref 135–145)
Total Bilirubin: 0.5 mg/dL (ref 0.2–1.2)
Total Protein: 7.2 g/dL (ref 6.0–8.3)

## 2023-01-17 LAB — CBC WITH DIFFERENTIAL/PLATELET
Basophils Absolute: 0.1 10*3/uL (ref 0.0–0.1)
Basophils Relative: 1.3 % (ref 0.0–3.0)
Eosinophils Absolute: 0.3 10*3/uL (ref 0.0–0.7)
Eosinophils Relative: 3 % (ref 0.0–5.0)
HCT: 40.7 % (ref 39.0–52.0)
Hemoglobin: 13.3 g/dL (ref 13.0–17.0)
Lymphocytes Relative: 28.3 % (ref 12.0–46.0)
Lymphs Abs: 2.6 10*3/uL (ref 0.7–4.0)
MCHC: 32.7 g/dL (ref 30.0–36.0)
MCV: 82.7 fl (ref 78.0–100.0)
Monocytes Absolute: 0.8 10*3/uL (ref 0.1–1.0)
Monocytes Relative: 8.6 % (ref 3.0–12.0)
Neutro Abs: 5.5 10*3/uL (ref 1.4–7.7)
Neutrophils Relative %: 58.8 % (ref 43.0–77.0)
Platelets: 302 10*3/uL (ref 150.0–400.0)
RBC: 4.92 Mil/uL (ref 4.22–5.81)
RDW: 13.8 % (ref 11.5–15.5)
WBC: 9.3 10*3/uL (ref 4.0–10.5)

## 2023-01-17 LAB — TSH: TSH: 1.89 u[IU]/mL (ref 0.35–5.50)

## 2023-01-17 LAB — HEPATIC FUNCTION PANEL
ALT: 25 U/L (ref 0–53)
AST: 20 U/L (ref 0–37)
Albumin: 4.4 g/dL (ref 3.5–5.2)
Alkaline Phosphatase: 49 U/L (ref 39–117)
Bilirubin, Direct: 0.1 mg/dL (ref 0.0–0.3)
Total Bilirubin: 0.5 mg/dL (ref 0.2–1.2)
Total Protein: 7.2 g/dL (ref 6.0–8.3)

## 2023-01-17 LAB — PSA: PSA: 1.65 ng/mL (ref 0.10–4.00)

## 2023-01-17 LAB — URINALYSIS
Bilirubin Urine: NEGATIVE
Hgb urine dipstick: NEGATIVE
Ketones, ur: NEGATIVE
Leukocytes,Ua: NEGATIVE
Nitrite: NEGATIVE
Specific Gravity, Urine: 1.015 (ref 1.000–1.030)
Total Protein, Urine: NEGATIVE
Urine Glucose: >=1000 — AB
Urobilinogen, UA: 0.2 (ref 0.0–1.0)
pH: 6 (ref 5.0–8.0)

## 2023-01-17 LAB — HEMOGLOBIN A1C: Hgb A1c MFr Bld: 7.8 % — ABNORMAL HIGH (ref 4.6–6.5)

## 2023-01-17 LAB — TESTOSTERONE: Testosterone: 978.42 ng/dL — ABNORMAL HIGH (ref 300.00–890.00)

## 2023-01-22 ENCOUNTER — Other Ambulatory Visit (HOSPITAL_COMMUNITY): Payer: Self-pay

## 2023-01-28 ENCOUNTER — Other Ambulatory Visit: Payer: Self-pay | Admitting: Internal Medicine

## 2023-01-28 DIAGNOSIS — G4733 Obstructive sleep apnea (adult) (pediatric): Secondary | ICD-10-CM | POA: Diagnosis not present

## 2023-01-29 ENCOUNTER — Other Ambulatory Visit: Payer: Self-pay

## 2023-01-29 ENCOUNTER — Other Ambulatory Visit: Payer: Self-pay | Admitting: Internal Medicine

## 2023-01-29 ENCOUNTER — Other Ambulatory Visit (HOSPITAL_COMMUNITY): Payer: Self-pay

## 2023-01-29 MED ORDER — TESTOSTERONE CYPIONATE 200 MG/ML IM SOLN
200.0000 mg | INTRAMUSCULAR | 1 refills | Status: DC
Start: 2023-01-29 — End: 2023-10-28
  Filled 2023-01-29: qty 6, 84d supply, fill #0
  Filled 2023-06-04: qty 4, 56d supply, fill #1

## 2023-01-29 MED ORDER — CARVEDILOL 6.25 MG PO TABS
6.2500 mg | ORAL_TABLET | Freq: Two times a day (BID) | ORAL | 1 refills | Status: DC
  Filled 2023-01-29: qty 180, 90d supply, fill #0
  Filled 2023-05-07: qty 180, 90d supply, fill #1

## 2023-01-30 ENCOUNTER — Other Ambulatory Visit (HOSPITAL_COMMUNITY): Payer: Self-pay

## 2023-01-30 MED ORDER — DOXAZOSIN MESYLATE 8 MG PO TABS
4.0000 mg | ORAL_TABLET | Freq: Every day | ORAL | 1 refills | Status: DC
  Filled 2023-01-30: qty 90, 90d supply, fill #0
  Filled 2023-04-29: qty 90, 90d supply, fill #1

## 2023-02-06 ENCOUNTER — Other Ambulatory Visit: Payer: Self-pay | Admitting: Cardiology

## 2023-02-06 ENCOUNTER — Other Ambulatory Visit (HOSPITAL_COMMUNITY): Payer: Self-pay

## 2023-02-07 ENCOUNTER — Other Ambulatory Visit (HOSPITAL_COMMUNITY): Payer: Self-pay

## 2023-02-07 MED ORDER — ATORVASTATIN CALCIUM 20 MG PO TABS
20.0000 mg | ORAL_TABLET | Freq: Every day | ORAL | 3 refills | Status: DC
  Filled 2023-02-07: qty 90, 90d supply, fill #0
  Filled 2023-05-07: qty 90, 90d supply, fill #1
  Filled 2023-08-06: qty 90, 90d supply, fill #2
  Filled 2023-11-10: qty 90, 90d supply, fill #3

## 2023-02-14 DIAGNOSIS — E1165 Type 2 diabetes mellitus with hyperglycemia: Secondary | ICD-10-CM | POA: Diagnosis not present

## 2023-02-14 DIAGNOSIS — G609 Hereditary and idiopathic neuropathy, unspecified: Secondary | ICD-10-CM | POA: Diagnosis not present

## 2023-02-14 DIAGNOSIS — I10 Essential (primary) hypertension: Secondary | ICD-10-CM | POA: Diagnosis not present

## 2023-02-14 DIAGNOSIS — R809 Proteinuria, unspecified: Secondary | ICD-10-CM | POA: Diagnosis not present

## 2023-02-17 ENCOUNTER — Other Ambulatory Visit (HOSPITAL_COMMUNITY): Payer: Self-pay

## 2023-02-23 ENCOUNTER — Other Ambulatory Visit: Payer: Self-pay | Admitting: Internal Medicine

## 2023-02-25 ENCOUNTER — Other Ambulatory Visit: Payer: Self-pay

## 2023-02-25 MED ORDER — BUPROPION HCL ER (XL) 300 MG PO TB24
300.0000 mg | ORAL_TABLET | Freq: Every day | ORAL | 3 refills | Status: DC
  Filled 2023-02-25: qty 90, 90d supply, fill #0
  Filled 2023-05-24: qty 90, 90d supply, fill #1
  Filled 2023-08-23: qty 90, 90d supply, fill #2
  Filled 2023-12-05: qty 90, 90d supply, fill #3

## 2023-02-28 ENCOUNTER — Other Ambulatory Visit (HOSPITAL_COMMUNITY): Payer: Self-pay

## 2023-02-28 DIAGNOSIS — G4733 Obstructive sleep apnea (adult) (pediatric): Secondary | ICD-10-CM | POA: Diagnosis not present

## 2023-02-28 MED ORDER — TOUJEO SOLOSTAR 300 UNIT/ML ~~LOC~~ SOPN
60.0000 [IU] | PEN_INJECTOR | Freq: Every morning | SUBCUTANEOUS | 5 refills | Status: AC
  Filled 2023-02-28: qty 4.5, 22d supply, fill #0
  Filled 2023-03-26: qty 4.5, 22d supply, fill #1

## 2023-03-03 ENCOUNTER — Other Ambulatory Visit: Payer: Self-pay | Admitting: Cardiology

## 2023-03-04 ENCOUNTER — Other Ambulatory Visit (HOSPITAL_COMMUNITY): Payer: Self-pay

## 2023-03-04 MED ORDER — TELMISARTAN-HCTZ 80-25 MG PO TABS
1.0000 | ORAL_TABLET | Freq: Every day | ORAL | 0 refills | Status: DC
  Filled 2023-03-04: qty 90, 90d supply, fill #0

## 2023-03-07 ENCOUNTER — Other Ambulatory Visit (HOSPITAL_COMMUNITY): Payer: Self-pay

## 2023-03-17 ENCOUNTER — Other Ambulatory Visit (HOSPITAL_COMMUNITY): Payer: Self-pay

## 2023-03-17 DIAGNOSIS — N401 Enlarged prostate with lower urinary tract symptoms: Secondary | ICD-10-CM | POA: Diagnosis not present

## 2023-03-17 DIAGNOSIS — R351 Nocturia: Secondary | ICD-10-CM | POA: Diagnosis not present

## 2023-03-17 MED ORDER — TAMSULOSIN HCL 0.4 MG PO CAPS
0.4000 mg | ORAL_CAPSULE | Freq: Every day | ORAL | 6 refills | Status: DC
  Filled 2023-03-17: qty 30, 30d supply, fill #0
  Filled 2023-04-29: qty 30, 30d supply, fill #1

## 2023-03-17 MED ORDER — GEMTESA 75 MG PO TABS
75.0000 mg | ORAL_TABLET | Freq: Every day | ORAL | 3 refills | Status: DC
  Filled 2023-03-17: qty 30, 30d supply, fill #0

## 2023-03-21 ENCOUNTER — Other Ambulatory Visit (HOSPITAL_COMMUNITY): Payer: Self-pay

## 2023-03-24 ENCOUNTER — Other Ambulatory Visit (HOSPITAL_COMMUNITY): Payer: Self-pay

## 2023-03-24 ENCOUNTER — Encounter (HOSPITAL_COMMUNITY): Payer: Self-pay

## 2023-03-25 ENCOUNTER — Other Ambulatory Visit (HOSPITAL_COMMUNITY): Payer: Self-pay

## 2023-03-27 ENCOUNTER — Other Ambulatory Visit: Payer: Self-pay

## 2023-03-30 DIAGNOSIS — G4733 Obstructive sleep apnea (adult) (pediatric): Secondary | ICD-10-CM | POA: Diagnosis not present

## 2023-04-01 ENCOUNTER — Encounter: Payer: Self-pay | Admitting: Adult Health

## 2023-04-01 ENCOUNTER — Ambulatory Visit (INDEPENDENT_AMBULATORY_CARE_PROVIDER_SITE_OTHER): Payer: 59 | Admitting: Adult Health

## 2023-04-01 VITALS — BP 140/73 | HR 90 | Ht 72.0 in | Wt 267.0 lb

## 2023-04-01 DIAGNOSIS — F331 Major depressive disorder, recurrent, moderate: Secondary | ICD-10-CM

## 2023-04-01 DIAGNOSIS — F411 Generalized anxiety disorder: Secondary | ICD-10-CM | POA: Diagnosis not present

## 2023-04-01 NOTE — Progress Notes (Signed)
[facility] MD/PA/NP Initial Note  04/01/2023 2:42 PM Victor Byrd  MRN:  [id number]  Chief Complaint:   HPI:   Patient seen today for initial psychiatric evaluation.   Describes mood today as "not too good". Pleasant. Communicative. Tearful at times. Mood symptoms - reports depression, anxiety, and irritability. Reports decreased interest and motivation. Reports panic attacks - once a week. Reports worry, rumination and over thinking. Fenies mood swings. Reports mood as lower. Reports he and wife having issues - both on different schedules. Stating I'm not feeling too good". Currently taking medications as prescribed - PCP prescribing psychiatric medications. Reports current medications have been helpful over the years - has taken Wellbutrin XL 450mg  for 10 years. Reports he is willing to consider other options. Taking medications as prescribed.  Energy levels lower. Active, does not have a regular exercise routine. Enjoys some usual interests and activities. Married. Lives with wife and 2 children - 53 and 75. Spending time with family. Appetite decreased. Weight loans. Sleeps better some nights than others. Averages 6 hours. Reports difficulties with focus and concentration difficulties - diagnosed with ADD in teens - took Ritalin. Completing tasks. Managing aspects of household. Reports house is in disarray. Works at [employer] - 40 hours a week - getting up at 3:30 in the mornings. Denies SI or HI.  Denies AH or VH. Denies self harm. Denies substance use.  Previous medication trials: Reports taking Ritalin in teenage years.  Visit Diagnosis: No diagnosis found.  Past Psychiatric History: Denies psychiatric hospitalization.   Past Medical History:  Past Medical History:  Diagnosis Date   Anxiety    Asthma    Depression    Diabetes mellitus type II    Hyperlipidemia    Hypertension    Hyperthyroidism    Pt denies thyroid issues   OSA on CPAP    Dr. Talmage Nap   Restless legs     Sleep apnea    cpap    Past Surgical History:  Procedure Laterality Date   COLONOSCOPY  11/01/2020   Ileene Patrick, MD   HERNIA REPAIR  1972 & 1987   VASECTOMY  2007   WISDOM TOOTH EXTRACTION  1992    Family Psychiatric History: Reports a family history of mental illness.   Family History:  Family History  Problem Relation Age of Onset   Diabetes Mother    Heart disease Father    Heart attack Father    Hypertension Father    Colon polyps Father    Diabetes Other    Hyperlipidemia Other    Lung cancer Other    Hypertension Other    Colon cancer Neg Hx    Esophageal cancer Neg Hx    Rectal cancer Neg Hx    Stomach cancer Neg Hx     Social History:  Social History   Socioeconomic History   Marital status: Married    Spouse name: Not on file   Number of children: 2   Years of education: Not on file   Highest education level: Not on file  Occupational History   Occupation: Haematologist: UNEMPLOYED    Comment: out of work  Tobacco Use   Smoking status: Never   Smokeless tobacco: Never  Vaping Use   Vaping status: Never Used  Substance and Sexual Activity   Alcohol use: Yes    Alcohol/week: 0.0 standard drinks of alcohol   Drug use: No   Sexual activity: Yes  Other Topics Concern  Not on file  Social History Narrative   Not on file   Social Determinants of Health   Financial Resource Strain: Not on file  Food Insecurity: Not on file  Transportation Needs: Not on file  Physical Activity: Not on file  Stress: Not on file  Social Connections: Not on file    Allergies:  Allergies  Allergen Reactions   Dapagliflozin Other (See Comments)   Ramipril     cough    Metabolic Disorder Labs: Lab Results  Component Value Date   HGBA1C 7.8 (H) 01/17/2023   MPG 166 (H) 04/05/2011   No results found for: "PROLACTIN" Lab Results  Component Value Date   CHOL 102 01/17/2023   TRIG 92.0 01/17/2023   HDL 33.40 (L) 01/17/2023   CHOLHDL 3  01/17/2023   VLDL 18.4 01/17/2023   LDLCALC 50 01/17/2023   LDLCALC 55 06/06/2022   Lab Results  Component Value Date   TSH 1.89 01/17/2023   TSH 2.13 01/11/2022    Therapeutic Level Labs: No results found for: "LITHIUM" No results found for: "VALPROATE" No results found for: "CBMZ"  Current Medications: Current Outpatient Medications  Medication Sig Dispense Refill   albuterol (VENTOLIN HFA) 108 (90 Base) MCG/ACT inhaler Inhale 2 puffs into the lungs every 6 (six) hours as needed for wheezing 6.7 g 1   amLODipine (NORVASC) 10 MG tablet Take 1 tablet (10 mg total) by mouth daily. 30 tablet 11   aspirin 81 MG tablet Take 81 mg by mouth daily.     atorvastatin (LIPITOR) 20 MG tablet Take 1 tablet (20 mg total) by mouth daily. 90 tablet 3   buPROPion (WELLBUTRIN XL) 150 MG 24 hr tablet Take 1 tablet (150 mg total) by mouth daily. 30 tablet 1   buPROPion (WELLBUTRIN XL) 300 MG 24 hr tablet Take 1 tablet (300 mg total) by mouth daily. 90 tablet 3   carvedilol (COREG) 6.25 MG tablet Take 1 tablet by mouth 2  times daily with a meal. 180 tablet 1   cloNIDine (CATAPRES) 0.2 MG tablet Take 1 tablet by mouth 2 times daily. 60 tablet 5   Continuous Blood Gluc Sensor (DEXCOM G7 SENSOR) MISC Replace q 10 days 3 each 11   Continuous Blood Gluc Sensor (DEXCOM G7 SENSOR) MISC Change sensor every 10 days 3 each 11   Continuous Blood Gluc Sensor (DEXCOM G7 SENSOR) MISC Use as directed every 10 days 3 each 6   Continuous Glucose Sensor (DEXCOM G7 SENSOR) MISC Change sensor every 10 days 9 each 3   doxazosin (CARDURA) 8 MG tablet Take 0.5-1 tablets (4-8 mg total) by mouth daily. 90 tablet 1   furosemide (LASIX) 20 MG tablet Take 1 - 2 tablets (20 - 40 mg total) by mouth daily as needed. 180 tablet 3   Ginkgo Biloba 40 MG TABS Take by mouth.     glimepiride (AMARYL) 4 MG tablet Take 1 tablet (4 mg total) by mouth 2 (two) times daily. 180 tablet 4   glucose blood test strip Use to test blood sugar up to  three times daily as needed. 100 each 6   insulin glargine, 1 Unit Dial, (TOUJEO SOLOSTAR) 300 UNIT/ML Solostar Pen Inject 60 Units into the skin every morning. 45 mL 5   insulin glargine-yfgn (SEMGLEE, YFGN,) 100 UNIT/ML Pen Inject 30 units under the skin twice a day 45 mL 3   insulin glargine-yfgn (SEMGLEE, YFGN,) 100 UNIT/ML Pen Inject 60 Units into the skin every morning use 50  Units 2 times daily. 15 mL 0   insulin glargine-yfgn (SEMGLEE, YFGN,) 100 UNIT/ML Pen Inject 60 units into the skin every morning and 50 units in the evening 36 mL 11   insulin glargine-yfgn (SEMGLEE, YFGN,) 100 UNIT/ML Pen Inject 50 Units into the skin 2 (two) times daily. 30 mL 5   Lancets (FREESTYLE) lancets Use as directed to test blood sugar up to 3 times a day as needed. 100 each 6   magnesium 30 MG tablet Take 30 mg by mouth daily.     metFORMIN (GLUCOPHAGE) 1000 MG tablet Take 1 tablet by mouth 2 times a day 180 tablet 4   Multiple Vitamin (MULTIVITAMIN WITH MINERALS) TABS tablet Take 1 tablet by mouth daily.     NEEDLE, DISP, 23 G (B-D DISP NEEDLE 23GX1-1/4") 23G X 1-1/4" MISC Use for testosterone injections 50 each 0   Omega-3 Fatty Acids (FISH OIL) 1000 MG CAPS 1 capsule     Semaglutide (RYBELSUS) 14 MG TABS Take 1 tablet once daily at least 30 minutes before first food, beverage or other oral medicine of the day 30 tablet 5   Semaglutide (RYBELSUS) 7 MG TABS Take 1 tablet by mouth daily with 4 oz of water on an empty stomach 30 minutes before breakfast or other meds 30 tablet 11   Semaglutide,0.25 or 0.5MG /DOS, (OZEMPIC, 0.25 OR 0.5 MG/DOSE,) 2 MG/3ML SOPN Inject 0.5 mg into the skin every 7 (seven) days. 3 mL 5   tadalafil (CIALIS) 20 MG tablet Take 1 tablet (20 mg total) by mouth daily as needed for erectile dysfunction. 10 tablet 3   tamsulosin (FLOMAX) 0.4 MG CAPS capsule Take 1 capsule (0.4 mg total) by mouth at bedtime. Don't take on same day taking cialis 30 capsule 6   telmisartan-hydrochlorothiazide  (MICARDIS HCT) 80-25 MG tablet Take 1 tablet by mouth daily. Pt needs to schedule appt for further refills. 90 tablet 0   testosterone cypionate (DEPOTESTOSTERONE CYPIONATE) 200 MG/ML injection Inject 1 mL (200 mg total) into the muscle every 14 days. 10 mL 1   Vibegron (GEMTESA) 75 MG TABS Take 1 tablet (75 mg total) by mouth daily. 30 tablet 3   No current facility-administered medications for this visit.    Medication Side Effects: none  Orders placed this visit:  No orders of the defined types were placed in this encounter.   Psychiatric Specialty Exam:  Review of Systems  Musculoskeletal:  Negative for gait problem.  Neurological:  Negative for tremors.  Psychiatric/Behavioral:         Please refer to HPI    There were no vitals taken for this visit.There is no height or weight on file to calculate BMI.  General Appearance: Casual and Neat  Eye Contact:  Good  Speech:  Clear and Coherent and Normal Rate  Volume:  Normal  Mood:  Anxious and Depressed  Affect:  Appropriate and Congruent  Thought Process:  Coherent  Orientation:  Full (Time, Place, and Person)  Thought Content: Logical   Suicidal Thoughts:  No  Homicidal Thoughts:  No  Memory:  WNL  Judgement:  Good  Insight:  Good  Psychomotor Activity:  Normal  Concentration:  Concentration: Good and Attention Span: Good  Recall:  Good  Fund of Knowledge: Good  Language: Good  Assets:  Communication Skills Desire for Improvement Financial Resources/Insurance Housing Intimacy Leisure Time Physical Health Resilience Social Support Talents/Skills Transportation Vocational/Educational  ADL's:  Intact  Cognition: WNL  Prognosis:  Good   Screenings:  PHQ2-9    Flowsheet Row Office Visit from 01/13/2023 [facility] Visit from 01/14/2022 in [facility] Visit from 07/23/2021 in [facility] at [facility] Office  Visit from 04/16/2021 in [facility] at [facility] Visit from 01/08/2021 in [facility] at [place]  PHQ-2 Total Score 4 3 4 1 1   PHQ-9 Total Score 16 16 15 1 5       Flowsheet Row ED from 11/16/2020 in [facility] Emergency Department at [facility]  C-SSRS RISK CATEGORY No Risk       Receiving Psychotherapy: Yes   Treatment Plan/Recommendations:   Plan:  PDMP reviewed  Current medications: Wellbutrin XL 450mg  daily - will call and confirm medication dosages before adding medication. Clonidine 0.2mg  BID  Consider ADD medication  RTC 4 weeks  Patient advised to contact office with any questions, adverse effects, or acute worsening in signs and symptoms.   Time spent with patient was 60 minutes. Greater than 50% of face to face time with patient was spent on counseling and coordination of care.      Dorothyann Gibbs, NP

## 2023-04-02 ENCOUNTER — Other Ambulatory Visit (HOSPITAL_COMMUNITY): Payer: Self-pay

## 2023-04-02 ENCOUNTER — Telehealth: Payer: Self-pay | Admitting: Adult Health

## 2023-04-02 ENCOUNTER — Other Ambulatory Visit: Payer: Self-pay

## 2023-04-02 MED ORDER — LISDEXAMFETAMINE DIMESYLATE 20 MG PO CAPS
20.0000 mg | ORAL_CAPSULE | Freq: Every day | ORAL | 0 refills | Status: DC
Start: 2023-04-02 — End: 2023-05-14
  Filled 2023-04-02: qty 30, 30d supply, fill #0

## 2023-04-02 NOTE — Telephone Encounter (Signed)
Pended 20 mg

## 2023-04-02 NOTE — Telephone Encounter (Signed)
Victor Byrd called in a left the following message. The dose on Wellbutrin is 300mg . There is a history of him having taken Vyvanse at some point but he would have to get records from his PCP at [facility], Dr. Posey Rea.

## 2023-04-08 ENCOUNTER — Other Ambulatory Visit (HOSPITAL_COMMUNITY): Payer: Self-pay

## 2023-04-09 ENCOUNTER — Telehealth: Payer: Self-pay | Admitting: Internal Medicine

## 2023-04-09 NOTE — Telephone Encounter (Signed)
Patient wants to know if he should be on the 150 and the 300 of the welbutrin - the 300 alone is not taking care of the problem - if you want him on both he will  need a nex rx for the 150 mg. Sent to [facility].  Please call patient and advise - [phone]

## 2023-04-11 ENCOUNTER — Other Ambulatory Visit (HOSPITAL_COMMUNITY): Payer: Self-pay

## 2023-04-11 MED ORDER — VORTIOXETINE HBR 10 MG PO TABS
10.0000 mg | ORAL_TABLET | Freq: Every day | ORAL | 5 refills | Status: DC
Start: 2023-04-11 — End: 2023-04-20
  Filled 2023-04-11: qty 30, 30d supply, fill #0

## 2023-04-11 MED ORDER — INSULIN GLARGINE-YFGN 100 UNIT/ML ~~LOC~~ SOPN
60.0000 [IU] | PEN_INJECTOR | Freq: Every day | SUBCUTANEOUS | 5 refills | Status: DC
  Filled 2023-04-11: qty 30, 50d supply, fill #0
  Filled 2023-06-28: qty 30, 50d supply, fill #1
  Filled 2023-09-08: qty 30, 50d supply, fill #2
  Filled 2023-12-01: qty 30, 50d supply, fill #3
  Filled 2024-01-19 (×2): qty 30, 50d supply, fill #4
  Filled 2024-03-19: qty 30, 50d supply, fill #5

## 2023-04-11 NOTE — Telephone Encounter (Signed)
I spoke with pt and was able to inform him of Dr. Loren Racer instructions and new meds add-on.  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

## 2023-04-11 NOTE — Telephone Encounter (Signed)
Stay on Wellbutrin 300 mg a day.  Will add Trintellix 10 mg 1 daily. Does Victor Byrd wants to be referred to psychiatry/psychology? Thanks

## 2023-04-17 ENCOUNTER — Encounter: Payer: Self-pay | Admitting: Internal Medicine

## 2023-04-17 ENCOUNTER — Other Ambulatory Visit (HOSPITAL_COMMUNITY): Payer: Self-pay

## 2023-04-18 ENCOUNTER — Other Ambulatory Visit (HOSPITAL_COMMUNITY): Payer: Self-pay

## 2023-04-20 ENCOUNTER — Other Ambulatory Visit: Payer: Self-pay | Admitting: Internal Medicine

## 2023-04-20 MED ORDER — VILAZODONE HCL 20 MG PO TABS
20.0000 mg | ORAL_TABLET | Freq: Every day | ORAL | 6 refills | Status: DC
  Filled 2023-04-20: qty 30, 30d supply, fill #0
  Filled 2023-06-09: qty 30, 30d supply, fill #1
  Filled 2023-07-13: qty 30, 30d supply, fill #2
  Filled 2023-08-15: qty 30, 30d supply, fill #3
  Filled 2023-09-10: qty 30, 30d supply, fill #4
  Filled 2023-10-10: qty 30, 30d supply, fill #5
  Filled 2023-11-10: qty 30, 30d supply, fill #6

## 2023-04-21 ENCOUNTER — Other Ambulatory Visit: Payer: Self-pay | Admitting: Cardiology

## 2023-04-21 ENCOUNTER — Other Ambulatory Visit (HOSPITAL_COMMUNITY): Payer: Self-pay

## 2023-04-22 ENCOUNTER — Other Ambulatory Visit: Payer: Self-pay

## 2023-04-23 ENCOUNTER — Other Ambulatory Visit (HOSPITAL_COMMUNITY): Payer: Self-pay

## 2023-04-23 MED ORDER — TELMISARTAN-HCTZ 80-25 MG PO TABS
1.0000 | ORAL_TABLET | Freq: Every day | ORAL | 2 refills | Status: DC
  Filled 2023-04-23 – 2023-06-09 (×4): qty 90, 90d supply, fill #0
  Filled 2023-09-10: qty 90, 90d supply, fill #1
  Filled 2023-12-12: qty 90, 90d supply, fill #2

## 2023-04-25 ENCOUNTER — Other Ambulatory Visit: Payer: Self-pay | Admitting: Internal Medicine

## 2023-04-28 ENCOUNTER — Other Ambulatory Visit (HOSPITAL_COMMUNITY): Payer: Self-pay

## 2023-04-28 MED ORDER — TADALAFIL 20 MG PO TABS
20.0000 mg | ORAL_TABLET | Freq: Every day | ORAL | 3 refills | Status: DC | PRN
  Filled 2023-04-28: qty 30, 30d supply, fill #0

## 2023-04-29 ENCOUNTER — Ambulatory Visit: Payer: 59 | Admitting: Adult Health

## 2023-04-30 ENCOUNTER — Other Ambulatory Visit: Payer: Self-pay

## 2023-04-30 ENCOUNTER — Other Ambulatory Visit (HOSPITAL_COMMUNITY): Payer: Self-pay

## 2023-04-30 DIAGNOSIS — G4733 Obstructive sleep apnea (adult) (pediatric): Secondary | ICD-10-CM | POA: Diagnosis not present

## 2023-05-02 ENCOUNTER — Other Ambulatory Visit (HOSPITAL_COMMUNITY): Payer: Self-pay

## 2023-05-03 ENCOUNTER — Other Ambulatory Visit (HOSPITAL_COMMUNITY): Payer: Self-pay

## 2023-05-09 ENCOUNTER — Other Ambulatory Visit (HOSPITAL_COMMUNITY): Payer: Self-pay

## 2023-05-10 ENCOUNTER — Other Ambulatory Visit (HOSPITAL_BASED_OUTPATIENT_CLINIC_OR_DEPARTMENT_OTHER): Payer: Self-pay

## 2023-05-14 ENCOUNTER — Encounter: Payer: Self-pay | Admitting: Adult Health

## 2023-05-14 ENCOUNTER — Other Ambulatory Visit (HOSPITAL_COMMUNITY): Payer: Self-pay

## 2023-05-14 ENCOUNTER — Ambulatory Visit: Payer: 59 | Admitting: Adult Health

## 2023-05-14 DIAGNOSIS — F331 Major depressive disorder, recurrent, moderate: Secondary | ICD-10-CM | POA: Diagnosis not present

## 2023-05-14 DIAGNOSIS — F411 Generalized anxiety disorder: Secondary | ICD-10-CM

## 2023-05-14 DIAGNOSIS — F902 Attention-deficit hyperactivity disorder, combined type: Secondary | ICD-10-CM

## 2023-05-14 MED ORDER — LISDEXAMFETAMINE DIMESYLATE 20 MG PO CAPS
20.0000 mg | ORAL_CAPSULE | Freq: Every day | ORAL | 0 refills | Status: DC
  Filled 2023-05-14: qty 30, 30d supply, fill #0

## 2023-05-14 NOTE — Progress Notes (Signed)
Victor Byrd [id number] 02-02-1966 57 y.o.  Subjective:   Patient ID:  Victor Byrd is a 57 y.o. (DOB April 13, 1966) male.  Chief Complaint: No chief complaint on file.   HPI Victor Byrd presents to the office today for follow-up of MDD, GAD, and ADD.  Describes mood today as "ok". Pleasant. Communicative. Tearful at times. Mood symptoms - reports depression, anxiety, and irritability. Reports decreased interest and motivation. Reports panic attacks. Reports worry, rumination and over thinking. Denies mood swings. Reports mood as lower. Reports he and wife working on relationship. Stating "things have been stressful lately". Currently taking medications as prescribed - PCP prescribing psychiatric medications. Reports current medications have been helpful over the years - has taken Wellbutrin XL 450mg  for 10 years. Reports he is willing to consider other options. Taking medications as prescribed.  Energy levels lower. Active, does not have a regular exercise routine. Enjoys some usual interests and activities. Married. Lives with wife and 2 children. Spending time with family. Appetite decreased. Weight loss. Sleeps better some nights than others.Reports difficulties with focus and concentration. He was diagnosed with ADD in teens. Completing tasks. Managing aspects of household. Works at [employer] - 40 hours a week - getting up at 3:30 in the mornings. Denies SI or HI.  Denies AH or VH. Denies self harm. Denies substance use.  Previous medication trials: Reports taking Ritalin in teenage years.   PHQ2-9    Flowsheet Row Office Visit from 01/13/2023 in [facility] Visit from 01/14/2022 in [facility] at [facility] Visit from 07/23/2021 in [facility] at [facility] Office Visit from 04/16/2021 in [facility] at [facility] Office Visit from 01/08/2021 in [facility] at  [place]  PHQ-2 Total Score 4 3 4 1 1   PHQ-9 Total Score 16 16 15 1 5       Flowsheet Row ED from 11/16/2020 in [facility] Emergency Department at [facility]  C-SSRS RISK CATEGORY No Risk        Review of Systems:  Review of Systems  Musculoskeletal:  Negative for gait problem.  Neurological:  Negative for tremors.  Psychiatric/Behavioral:         Please refer to HPI    Medications: I have reviewed the patient's current medications.  Current Outpatient Medications  Medication Sig Dispense Refill   albuterol (VENTOLIN HFA) 108 (90 Base) MCG/ACT inhaler Inhale 2 puffs into the lungs every 6 (six) hours as needed for wheezing 6.7 g 1   amLODipine (NORVASC) 10 MG tablet Take 1 tablet (10 mg total) by mouth daily. 30 tablet 11   aspirin 81 MG tablet Take 81 mg by mouth daily.     atorvastatin (LIPITOR) 20 MG tablet Take 1 tablet (20 mg total) by mouth daily. 90 tablet 3   buPROPion (WELLBUTRIN XL) 300 MG 24 hr tablet Take 1 tablet (300 mg total) by mouth daily. 90 tablet 3   carvedilol (COREG) 6.25 MG tablet Take 1 tablet by mouth 2  times daily with a meal. 180 tablet 1   cloNIDine (CATAPRES) 0.2 MG tablet Take 1 tablet by mouth 2 times daily. 60 tablet 5   Continuous Blood Gluc Sensor (DEXCOM G7 SENSOR) MISC Replace q 10 days 3 each 11   Continuous Blood Gluc Sensor (DEXCOM G7 SENSOR) MISC Change sensor every 10 days 3 each 11   Continuous Blood Gluc Sensor (DEXCOM G7 SENSOR) MISC  Use as directed every 10 days 3 each 6   Continuous Glucose Sensor (DEXCOM G7 SENSOR) MISC Change sensor every 10 days 9 each 3   doxazosin (CARDURA) 8 MG tablet Take 0.5-1 tablets (4-8 mg total) by mouth daily. 90 tablet 1   furosemide (LASIX) 20 MG tablet Take 1 - 2 tablets (20 - 40 mg total) by mouth daily as needed. 180 tablet 3   Ginkgo Biloba 40 MG TABS Take by mouth.     glimepiride (AMARYL) 4 MG tablet Take 1 tablet (4 mg total) by mouth 2 (two) times daily. 180 tablet 4   glucose  blood test strip Use to test blood sugar up to three times daily as needed. 100 each 6   insulin glargine, 1 Unit Dial, (TOUJEO SOLOSTAR) 300 UNIT/ML Solostar Pen Inject 60 Units into the skin every morning. 45 mL 5   insulin glargine-yfgn (SEMGLEE, YFGN,) 100 UNIT/ML Pen Inject 30 units under the skin twice a day 45 mL 3   insulin glargine-yfgn (SEMGLEE, YFGN,) 100 UNIT/ML Pen Inject 60 Units into the skin every morning use 50 Units 2 times daily. 15 mL 0   insulin glargine-yfgn (SEMGLEE, YFGN,) 100 UNIT/ML Pen Inject 60 units into the skin every morning and 50 units in the evening 36 mL 11   insulin glargine-yfgn (SEMGLEE, YFGN,) 100 UNIT/ML Pen Inject 50 Units into the skin 2 (two) times daily. 30 mL 5   insulin glargine-yfgn (SEMGLEE, YFGN,) 100 UNIT/ML Pen Inject 60 Units into the skin daily. 30 mL 5   Lancets (FREESTYLE) lancets Use as directed to test blood sugar up to 3 times a day as needed. 100 each 6   lisdexamfetamine (VYVANSE) 20 MG capsule Take 1 capsule (20 mg total) by mouth daily. 30 capsule 0   magnesium 30 MG tablet Take 30 mg by mouth daily.     metFORMIN (GLUCOPHAGE) 1000 MG tablet Take 1 tablet by mouth 2 times a day 180 tablet 4   Multiple Vitamin (MULTIVITAMIN WITH MINERALS) TABS tablet Take 1 tablet by mouth daily.     NEEDLE, DISP, 23 G (B-D DISP NEEDLE 23GX1-1/4") 23G X 1-1/4" MISC Use for testosterone injections 50 each 0   Omega-3 Fatty Acids (FISH OIL) 1000 MG CAPS 1 capsule     Semaglutide (RYBELSUS) 14 MG TABS Take 1 tablet once daily at least 30 minutes before first food, beverage or other oral medicine of the day 30 tablet 5   Semaglutide (RYBELSUS) 7 MG TABS Take 1 tablet by mouth daily with 4 oz of water on an empty stomach 30 minutes before breakfast or other meds 30 tablet 11   Semaglutide,0.25 or 0.5MG /DOS, (OZEMPIC, 0.25 OR 0.5 MG/DOSE,) 2 MG/3ML SOPN Inject 0.5 mg into the skin every 7 (seven) days. 3 mL 5   tadalafil (CIALIS) 20 MG tablet Take 1 tablet (20  mg total) by mouth daily as needed for erectile dysfunction. 10 tablet 3   tamsulosin (FLOMAX) 0.4 MG CAPS capsule Take 1 capsule (0.4 mg total) by mouth at bedtime. Don't take on same day taking cialis 30 capsule 6   telmisartan-hydrochlorothiazide (MICARDIS HCT) 80-25 MG tablet Take 1 tablet by mouth daily. 90 tablet 2   testosterone cypionate (DEPOTESTOSTERONE CYPIONATE) 200 MG/ML injection Inject 1 mL (200 mg total) into the muscle every 14 days. 10 mL 1   Vibegron (GEMTESA) 75 MG TABS Take 1 tablet (75 mg total) by mouth daily. 30 tablet 3   Vilazodone HCl (VIIBRYD) 20 MG TABS  Take 1 tablet (20 mg total) by mouth daily. 30 tablet 6   No current facility-administered medications for this visit.    Medication Side Effects: None  Allergies:  Allergies  Allergen Reactions   Dapagliflozin Other (See Comments)   Ramipril     cough    Past Medical History:  Diagnosis Date   Anxiety    Asthma    Depression    Diabetes mellitus type II    Hyperlipidemia    Hypertension    Hyperthyroidism    Pt denies thyroid issues   OSA on CPAP    Dr. Talmage Nap   Restless legs    Sleep apnea    cpap    Past Medical History, Surgical history, Social history, and Family history were reviewed and updated as appropriate.   Please see review of systems for further details on the patient's review from today.   Objective:   Physical Exam:  There were no vitals taken for this visit.  Physical Exam Constitutional:      General: He is not in acute distress. Musculoskeletal:        General: No deformity.  Neurological:     Mental Status: He is alert and oriented to person, place, and time.     Coordination: Coordination normal.  Psychiatric:        Attention and Perception: Attention and perception normal. He does not perceive auditory or visual hallucinations.        Mood and Affect: Mood normal. Mood is not anxious or depressed. Affect is not labile, blunt, angry or inappropriate.         Speech: Speech normal.        Behavior: Behavior normal.        Thought Content: Thought content normal. Thought content is not paranoid or delusional. Thought content does not include homicidal or suicidal ideation. Thought content does not include homicidal or suicidal plan.        Cognition and Memory: Cognition and memory normal.        Judgment: Judgment normal.     Comments: Insight intact     Lab Review:     Component Value Date/Time   NA 135 01/17/2023 1101   NA 140 06/06/2022 0854   K 3.8 01/17/2023 1101   CL 98 01/17/2023 1101   CO2 31 01/17/2023 1101   GLUCOSE 173 (H) 01/17/2023 1101   BUN 19 01/17/2023 1101   BUN 16 06/06/2022 0854   CREATININE 0.93 01/17/2023 1101   CREATININE 0.82 04/05/2011 0854   CALCIUM 9.6 01/17/2023 1101   PROT 7.2 01/17/2023 1101   PROT 7.2 01/17/2023 1101   PROT 6.8 06/06/2022 0854   ALBUMIN 4.4 01/17/2023 1101   ALBUMIN 4.4 01/17/2023 1101   ALBUMIN 4.2 06/06/2022 0854   AST 20 01/17/2023 1101   AST 20 01/17/2023 1101   ALT 25 01/17/2023 1101   ALT 25 01/17/2023 1101   ALKPHOS 49 01/17/2023 1101   ALKPHOS 49 01/17/2023 1101   BILITOT 0.5 01/17/2023 1101   BILITOT 0.5 01/17/2023 1101   BILITOT 0.4 06/06/2022 0854   GFRNONAA >60 11/16/2020 1456   GFRAA >60 05/12/2018 1404       Component Value Date/Time   WBC 9.3 01/17/2023 1101   RBC 4.92 01/17/2023 1101   HGB 13.3 01/17/2023 1101   HCT 40.7 01/17/2023 1101   PLT 302.0 01/17/2023 1101   MCV 82.7 01/17/2023 1101   MCH 26.5 11/16/2020 1456   MCHC 32.7 01/17/2023 1101  RDW 13.8 01/17/2023 1101   LYMPHSABS 2.6 01/17/2023 1101   MONOABS 0.8 01/17/2023 1101   EOSABS 0.3 01/17/2023 1101   BASOSABS 0.1 01/17/2023 1101    No results found for: "POCLITH", "LITHIUM"   No results found for: "PHENYTOIN", "PHENOBARB", "VALPROATE", "CBMZ"   .res Assessment: Plan:    Treatment Plan/Recommendations:   Plan:  PDMP reviewed  Current medications: Wellbutrin XL 450mg  daily -  will call and confirm medication dosages before adding medication. Clonidine 0.2mg  BID  Added Vyvanse 20mg  daily  RTC 4 weeks  Patient advised to contact office with any questions, adverse effects, or acute worsening in signs and symptoms.   Time spent with patient was 60 minutes. Greater than 50% of face to face time with patient was spent on counseling and coordination of care.    There are no diagnoses linked to this encounter.   Please see After Visit Summary for patient specific instructions.  Future Appointments  Date Time Provider Department Center  05/14/2023  3:20 PM Lesly Joslyn, Thereasa Solo, NP CP-CP None    No orders of the defined types were placed in this encounter.   -------------------------------

## 2023-05-26 ENCOUNTER — Encounter: Payer: Self-pay | Admitting: Internal Medicine

## 2023-06-01 ENCOUNTER — Other Ambulatory Visit (HOSPITAL_COMMUNITY): Payer: Self-pay

## 2023-06-02 ENCOUNTER — Other Ambulatory Visit (HOSPITAL_COMMUNITY): Payer: Self-pay

## 2023-06-02 MED ORDER — AMLODIPINE BESYLATE 10 MG PO TABS
10.0000 mg | ORAL_TABLET | Freq: Every day | ORAL | 11 refills | Status: DC
  Filled 2023-06-02: qty 30, 30d supply, fill #0
  Filled 2023-07-04: qty 30, 30d supply, fill #1
  Filled 2023-08-15: qty 30, 30d supply, fill #2
  Filled 2023-09-10: qty 30, 30d supply, fill #3
  Filled 2023-10-10: qty 30, 30d supply, fill #4
  Filled 2023-11-10: qty 30, 30d supply, fill #5
  Filled 2023-12-12: qty 30, 30d supply, fill #6
  Filled 2024-01-17: qty 30, 30d supply, fill #7
  Filled 2024-02-16: qty 30, 30d supply, fill #8
  Filled 2024-03-11: qty 30, 30d supply, fill #9
  Filled 2024-04-14: qty 30, 30d supply, fill #10
  Filled 2024-05-16: qty 30, 30d supply, fill #11

## 2023-06-05 ENCOUNTER — Other Ambulatory Visit (HOSPITAL_COMMUNITY): Payer: Self-pay

## 2023-06-09 ENCOUNTER — Other Ambulatory Visit (HOSPITAL_COMMUNITY): Payer: Self-pay

## 2023-06-09 ENCOUNTER — Other Ambulatory Visit: Payer: Self-pay | Admitting: Adult Health

## 2023-06-09 DIAGNOSIS — F902 Attention-deficit hyperactivity disorder, combined type: Secondary | ICD-10-CM

## 2023-06-10 ENCOUNTER — Other Ambulatory Visit (HOSPITAL_COMMUNITY): Payer: Self-pay

## 2023-06-10 ENCOUNTER — Other Ambulatory Visit: Payer: Self-pay

## 2023-06-10 ENCOUNTER — Telehealth: Payer: Self-pay

## 2023-06-10 NOTE — Telephone Encounter (Signed)
 Pharmacy Patient Advocate Encounter   Received notification from CoverMyMeds that prior authorization for Testosterone  Cypionate 200MG /ML intramuscular solution is required/requested.   Insurance verification completed.   The patient is insured through [facility] .   Per test claim: PA required; PA submitted to above mentioned insurance via CoverMyMeds Key/confirmation #/[id number] [facility] Status is pending

## 2023-06-10 NOTE — Telephone Encounter (Signed)
Due 1/8

## 2023-06-11 ENCOUNTER — Telehealth: Payer: 59 | Admitting: Adult Health

## 2023-06-11 ENCOUNTER — Other Ambulatory Visit (HOSPITAL_COMMUNITY): Payer: Self-pay

## 2023-06-11 ENCOUNTER — Encounter: Payer: Self-pay | Admitting: Adult Health

## 2023-06-11 DIAGNOSIS — F331 Major depressive disorder, recurrent, moderate: Secondary | ICD-10-CM | POA: Diagnosis not present

## 2023-06-11 DIAGNOSIS — F411 Generalized anxiety disorder: Secondary | ICD-10-CM | POA: Diagnosis not present

## 2023-06-11 DIAGNOSIS — F902 Attention-deficit hyperactivity disorder, combined type: Secondary | ICD-10-CM

## 2023-06-11 DIAGNOSIS — F909 Attention-deficit hyperactivity disorder, unspecified type: Secondary | ICD-10-CM | POA: Diagnosis not present

## 2023-06-11 MED ORDER — LISDEXAMFETAMINE DIMESYLATE 30 MG PO CAPS
30.0000 mg | ORAL_CAPSULE | Freq: Every day | ORAL | 0 refills | Status: DC
  Filled 2023-06-11: qty 30, 30d supply, fill #0

## 2023-06-11 NOTE — Telephone Encounter (Signed)
 Has appt today

## 2023-06-11 NOTE — Progress Notes (Signed)
 Victor Byrd [id number] [birth year] 58 y.o.  Virtual Visit via Video Note  I connected with pt @ on 06/11/23 at  2:30 PM EST by a video enabled telemedicine application and verified that I am speaking with the correct person using two identifiers.   I discussed the limitations of evaluation and management by telemedicine and the availability of in person appointments. The patient expressed understanding and agreed to proceed.  I discussed the assessment and treatment plan with the patient. The patient was provided an opportunity to ask questions and all were answered. The patient agreed with the plan and demonstrated an understanding of the instructions.   The patient was advised to call back or seek an in-person evaluation if the symptoms worsen or if the condition fails to improve as anticipated.  I provided 15 minutes of non-face-to-face time during this encounter.  The patient was located at home.  The provider was located at [facility].   Victor LOISE Sayers, NP   Subjective:   Patient ID:  Victor Byrd is a 58 y.o. (DOB 15-Aug-1965) male.  Chief Complaint: No chief complaint on file.   HPI Victor Byrd presents for follow-up of MDD, GAD, and ADD.  Describes mood today as ok. Pleasant. Communicative. Tearful at times. Mood symptoms - reports depression, anxiety, and irritability - not every day. Reports varying interest and motivation. Denies recent panic attacks. Denies worry, rumination and over thinking. Denies recent mood swings - not often. Reports he and wife are working on relationship and talking more. Reports mood as variable. Stating I feel better recently than in the past. Reports current medications are helpful, but is willing to consider other options. Taking medications as prescribed.  Energy levels comes and goes. Active, does not have a regular exercise routine. Enjoys some usual interests and activities. Married. Lives with wife and 2 children.  Spending time with family. Appetite decreased. Weight loss - reports side effects of the Ozempic . Sleeps better some nights than others - it varies. Averages 5.5 hours during the week and longer on the weekends. Reports difficulties with focus and concentration - uncertain if Vyvanse  helpful. He was diagnosed with ADD in teens. Completing tasks. Managing aspects of household. Works at [employer] - 40 hours a week - getting up at 3:30 in the morning on the days he works. Denies SI or HI.  Denies AH or VH. Denies self harm. Denies substance use.  Previous medication trials: Reports taking Ritalin in teenage years.   Review of Systems:  Review of Systems  Musculoskeletal:  Negative for gait problem.  Neurological:  Negative for tremors.  Psychiatric/Behavioral:         Please refer to HPI    Medications: I have reviewed the patient's current medications.  Current Outpatient Medications  Medication Sig Dispense Refill   albuterol  (VENTOLIN  HFA) 108 (90 Base) MCG/ACT inhaler Inhale 2 puffs into the lungs every 6 (six) hours as needed for wheezing 6.7 g 1   amLODipine  (NORVASC ) 10 MG tablet Take 1 tablet (10 mg total) by mouth daily. 30 tablet 11   aspirin 81 MG tablet Take 81 mg by mouth daily.     atorvastatin  (LIPITOR) 20 MG tablet Take 1 tablet (20 mg total) by mouth daily. 90 tablet 3   buPROPion  (WELLBUTRIN  XL) 300 MG 24 hr tablet Take 1 tablet (300 mg total) by mouth daily. 90 tablet 3   carvedilol  (COREG ) 6.25 MG tablet Take 1 tablet by mouth 2  times daily with  a meal. 180 tablet 1   cloNIDine  (CATAPRES ) 0.2 MG tablet Take 1 tablet by mouth 2 times daily. 60 tablet 5   Continuous Blood Gluc Sensor (DEXCOM G7 SENSOR) MISC Replace q 10 days 3 each 11   Continuous Blood Gluc Sensor (DEXCOM G7 SENSOR) MISC Change sensor every 10 days 3 each 11   Continuous Blood Gluc Sensor (DEXCOM G7 SENSOR) MISC Use as directed every 10 days 3 each 6   Continuous Glucose Sensor (DEXCOM G7 SENSOR) MISC  Change sensor every 10 days 9 each 3   doxazosin  (CARDURA ) 8 MG tablet Take 0.5-1 tablets (4-8 mg total) by mouth daily. 90 tablet 1   furosemide  (LASIX ) 20 MG tablet Take 1 - 2 tablets (20 - 40 mg total) by mouth daily as needed. 180 tablet 3   Ginkgo Biloba 40 MG TABS Take by mouth.     glimepiride  (AMARYL ) 4 MG tablet Take 1 tablet (4 mg total) by mouth 2 (two) times daily. 180 tablet 4   glucose blood test strip Use to test blood sugar up to three times daily as needed. 100 each 6   insulin  glargine, 1 Unit Dial , (TOUJEO  SOLOSTAR) 300 UNIT/ML Solostar Pen Inject 60 Units into the skin every morning. 45 mL 5   insulin  glargine-yfgn (SEMGLEE , YFGN,) 100 UNIT/ML Pen Inject 30 units under the skin twice a day 45 mL 3   insulin  glargine-yfgn (SEMGLEE , YFGN,) 100 UNIT/ML Pen Inject 60 Units into the skin every morning use 50 Units 2 times daily. 15 mL 0   insulin  glargine-yfgn (SEMGLEE , YFGN,) 100 UNIT/ML Pen Inject 60 units into the skin every morning and 50 units in the evening 36 mL 11   insulin  glargine-yfgn (SEMGLEE , YFGN,) 100 UNIT/ML Pen Inject 50 Units into the skin 2 (two) times daily. 30 mL 5   insulin  glargine-yfgn (SEMGLEE , YFGN,) 100 UNIT/ML Pen Inject 60 Units into the skin daily. 30 mL 5   Lancets (FREESTYLE) lancets Use as directed to test blood sugar up to 3 times a day as needed. 100 each 6   lisdexamfetamine (VYVANSE ) 20 MG capsule Take 1 capsule (20 mg total) by mouth daily. 30 capsule 0   magnesium 30 MG tablet Take 30 mg by mouth daily.     metFORMIN  (GLUCOPHAGE ) 1000 MG tablet Take 1 tablet by mouth 2 times a day 180 tablet 4   Multiple Vitamin (MULTIVITAMIN WITH MINERALS) TABS tablet Take 1 tablet by mouth daily.     NEEDLE, DISP, 23 G (B-D DISP NEEDLE 23GX1-1/4) 23G X 1-1/4 MISC Use for testosterone  injections 50 each 0   Omega-3 Fatty Acids (FISH OIL) 1000 MG CAPS 1 capsule     Semaglutide  (RYBELSUS ) 14 MG TABS Take 1 tablet once daily at least 30 minutes before first  food, beverage or other oral medicine of the day 30 tablet 5   Semaglutide  (RYBELSUS ) 7 MG TABS Take 1 tablet by mouth daily with 4 oz of water on an empty stomach 30 minutes before breakfast or other meds 30 tablet 11   Semaglutide ,0.25 or 0.5MG /DOS, (OZEMPIC , 0.25 OR 0.5 MG/DOSE,) 2 MG/3ML SOPN Inject 0.5 mg into the skin every 7 (seven) days. 3 mL 5   tadalafil  (CIALIS ) 20 MG tablet Take 1 tablet (20 mg total) by mouth daily as needed for erectile dysfunction. 10 tablet 3   tamsulosin  (FLOMAX ) 0.4 MG CAPS capsule Take 1 capsule (0.4 mg total) by mouth at bedtime. Don't take on same day taking cialis  30 capsule 6  telmisartan -hydrochlorothiazide  (MICARDIS  HCT) 80-25 MG tablet Take 1 tablet by mouth daily. 90 tablet 2   testosterone  cypionate (DEPOTESTOSTERONE CYPIONATE) 200 MG/ML injection Inject 1 mL (200 mg total) into the muscle every 14 days. 10 mL 1   Vibegron  (GEMTESA ) 75 MG TABS Take 1 tablet (75 mg total) by mouth daily. 30 tablet 3   Vilazodone  HCl (VIIBRYD ) 20 MG TABS Take 1 tablet (20 mg total) by mouth daily. 30 tablet 6   No current facility-administered medications for this visit.    Medication Side Effects: None  Allergies:  Allergies  Allergen Reactions   Dapagliflozin Other (See Comments)   Ramipril     cough    Past Medical History:  Diagnosis Date   Anxiety    Asthma    Depression    Diabetes mellitus type II    Hyperlipidemia    Hypertension    Hyperthyroidism    Pt denies thyroid  issues   OSA on CPAP    Dr. Tommas   Restless legs    Sleep apnea    cpap    Family History  Problem Relation Age of Onset   Diabetes Mother    Heart disease Father    Heart attack Father    Hypertension Father    Colon polyps Father    Diabetes Other    Hyperlipidemia Other    Lung cancer Other    Hypertension Other    Colon cancer Neg Hx    Esophageal cancer Neg Hx    Rectal cancer Neg Hx    Stomach cancer Neg Hx     Social History   Socioeconomic History    Marital status: Married    Spouse name: Not on file   Number of children: 2   Years of education: Not on file   Highest education level: Not on file  Occupational History   Occupation: Haematologist: UNEMPLOYED    Comment: out of work  Tobacco Use   Smoking status: Never   Smokeless tobacco: Never  Vaping Use   Vaping status: Never Used  Substance and Sexual Activity   Alcohol use: Yes    Alcohol/week: 0.0 standard drinks of alcohol   Drug use: No   Sexual activity: Yes  Other Topics Concern   Not on file  Social History Narrative   Not on file   Social Drivers of Health   Financial Resource Strain: Not on file  Food Insecurity: Not on file  Transportation Needs: Not on file  Physical Activity: Not on file  Stress: Not on file  Social Connections: Not on file  Intimate Partner Violence: Not on file    Past Medical History, Surgical history, Social history, and Family history were reviewed and updated as appropriate.   Please see review of systems for further details on the patient's review from today.   Objective:   Physical Exam:  There were no vitals taken for this visit.  Physical Exam Constitutional:      General: He is not in acute distress. Musculoskeletal:        General: No deformity.  Neurological:     Mental Status: He is alert and oriented to person, place, and time.     Coordination: Coordination normal.  Psychiatric:        Attention and Perception: Attention and perception normal. He does not perceive auditory or visual hallucinations.        Mood and Affect: Affect is not labile, blunt, angry or inappropriate.  Speech: Speech normal.        Behavior: Behavior normal.        Thought Content: Thought content normal. Thought content is not paranoid or delusional. Thought content does not include homicidal or suicidal ideation. Thought content does not include homicidal or suicidal plan.        Cognition and Memory: Cognition and  memory normal.        Judgment: Judgment normal.     Comments: Insight intact     Lab Review:     Component Value Date/Time   NA 135 01/17/2023 1101   NA 140 06/06/2022 0854   K 3.8 01/17/2023 1101   CL 98 01/17/2023 1101   CO2 31 01/17/2023 1101   GLUCOSE 173 (H) 01/17/2023 1101   BUN 19 01/17/2023 1101   BUN 16 06/06/2022 0854   CREATININE 0.93 01/17/2023 1101   CREATININE 0.82 04/05/2011 0854   CALCIUM  9.6 01/17/2023 1101   PROT 7.2 01/17/2023 1101   PROT 7.2 01/17/2023 1101   PROT 6.8 06/06/2022 0854   ALBUMIN 4.4 01/17/2023 1101   ALBUMIN 4.4 01/17/2023 1101   ALBUMIN 4.2 06/06/2022 0854   AST 20 01/17/2023 1101   AST 20 01/17/2023 1101   ALT 25 01/17/2023 1101   ALT 25 01/17/2023 1101   ALKPHOS 49 01/17/2023 1101   ALKPHOS 49 01/17/2023 1101   BILITOT 0.5 01/17/2023 1101   BILITOT 0.5 01/17/2023 1101   BILITOT 0.4 06/06/2022 0854   GFRNONAA >60 11/16/2020 1456   GFRAA >60 05/12/2018 1404       Component Value Date/Time   WBC 9.3 01/17/2023 1101   RBC 4.92 01/17/2023 1101   HGB 13.3 01/17/2023 1101   HCT 40.7 01/17/2023 1101   PLT 302.0 01/17/2023 1101   MCV 82.7 01/17/2023 1101   MCH 26.5 11/16/2020 1456   MCHC 32.7 01/17/2023 1101   RDW 13.8 01/17/2023 1101   LYMPHSABS 2.6 01/17/2023 1101   MONOABS 0.8 01/17/2023 1101   EOSABS 0.3 01/17/2023 1101   BASOSABS 0.1 01/17/2023 1101    No results found for: POCLITH, LITHIUM   No results found for: PHENYTOIN, PHENOBARB, VALPROATE, CBMZ   .res Assessment: Plan:    Treatment Plan/Recommendations:   Plan:  PDMP reviewed  Current medications: Wellbutrin  XL 450mg  daily  Clonidine  0.2mg  BID  RTC 4 weeks  Patient advised to contact office with any questions, adverse effects, or acute worsening in signs and symptoms.  There are no diagnoses linked to this encounter.   Please see After Visit Summary for patient specific instructions.  Future Appointments  Date Time Provider  Department Center  06/11/2023  2:30 PM Drake Wuertz Nattalie, NP [facility] None    No orders of the defined types were placed in this encounter.     -------------------------------

## 2023-06-13 ENCOUNTER — Other Ambulatory Visit (HOSPITAL_COMMUNITY): Payer: Self-pay

## 2023-06-17 ENCOUNTER — Other Ambulatory Visit: Payer: Self-pay | Admitting: Internal Medicine

## 2023-06-17 ENCOUNTER — Other Ambulatory Visit (HOSPITAL_COMMUNITY): Payer: Self-pay

## 2023-06-18 ENCOUNTER — Other Ambulatory Visit (HOSPITAL_COMMUNITY): Payer: Self-pay

## 2023-06-18 ENCOUNTER — Other Ambulatory Visit: Payer: Self-pay

## 2023-06-18 MED ORDER — FUROSEMIDE 20 MG PO TABS
20.0000 mg | ORAL_TABLET | Freq: Every day | ORAL | 3 refills | Status: AC | PRN
  Filled 2023-06-18: qty 180, 90d supply, fill #0
  Filled 2023-09-20: qty 180, 90d supply, fill #1
  Filled 2024-02-03: qty 180, 90d supply, fill #2
  Filled 2024-05-01: qty 180, 90d supply, fill #3

## 2023-06-20 ENCOUNTER — Other Ambulatory Visit: Payer: Self-pay

## 2023-06-24 ENCOUNTER — Other Ambulatory Visit (HOSPITAL_COMMUNITY): Payer: Self-pay

## 2023-06-24 ENCOUNTER — Other Ambulatory Visit: Payer: Self-pay

## 2023-06-24 ENCOUNTER — Telehealth: Payer: Self-pay | Admitting: Adult Health

## 2023-06-24 MED ORDER — AMPHETAMINE-DEXTROAMPHETAMINE 10 MG PO TABS
10.0000 mg | ORAL_TABLET | Freq: Every day | ORAL | 0 refills | Status: DC
  Filled 2023-06-24: qty 30, 30d supply, fill #0

## 2023-06-24 NOTE — Telephone Encounter (Signed)
He has not tried Adderall in the past. He is open to it as long as it does not cost $300.

## 2023-06-24 NOTE — Telephone Encounter (Signed)
Pt lvm that the vyvanse is too expensive and he can't afford it. He wants to know is there something else he can take that would be cheaper. Please call him at [phone]

## 2023-06-24 NOTE — Telephone Encounter (Signed)
Rf off.

## 2023-06-25 ENCOUNTER — Other Ambulatory Visit (HOSPITAL_COMMUNITY): Payer: Self-pay

## 2023-06-25 ENCOUNTER — Other Ambulatory Visit: Payer: Self-pay

## 2023-06-25 NOTE — Telephone Encounter (Signed)
Pharmacy Patient Advocate Encounter  Received notification from [facility] that Prior Authorization for Testosterone Cypionate 200MG /ML intramuscular solution  has been APPROVED from 06/23/23 to 03/21/25. Ran test claim, Copay is $49.56. This test claim was processed through [facility]- copay amounts may vary at other pharmacies due to pharmacy/plan contracts, or as the patient moves through the different stages of their insurance plan.   PA #/Case ID/Reference #: [id number]

## 2023-06-27 ENCOUNTER — Other Ambulatory Visit (HOSPITAL_COMMUNITY): Payer: Self-pay

## 2023-06-27 DIAGNOSIS — E7800 Pure hypercholesterolemia, unspecified: Secondary | ICD-10-CM | POA: Diagnosis not present

## 2023-06-27 DIAGNOSIS — G609 Hereditary and idiopathic neuropathy, unspecified: Secondary | ICD-10-CM | POA: Diagnosis not present

## 2023-06-27 DIAGNOSIS — E1165 Type 2 diabetes mellitus with hyperglycemia: Secondary | ICD-10-CM | POA: Diagnosis not present

## 2023-06-27 DIAGNOSIS — R809 Proteinuria, unspecified: Secondary | ICD-10-CM | POA: Diagnosis not present

## 2023-06-27 DIAGNOSIS — I10 Essential (primary) hypertension: Secondary | ICD-10-CM | POA: Diagnosis not present

## 2023-06-30 ENCOUNTER — Other Ambulatory Visit: Payer: Self-pay

## 2023-07-15 ENCOUNTER — Other Ambulatory Visit (HOSPITAL_COMMUNITY): Payer: Self-pay

## 2023-07-16 ENCOUNTER — Other Ambulatory Visit (HOSPITAL_COMMUNITY): Payer: Self-pay

## 2023-07-20 ENCOUNTER — Other Ambulatory Visit (HOSPITAL_COMMUNITY): Payer: Self-pay

## 2023-07-20 MED ORDER — OZEMPIC (0.25 OR 0.5 MG/DOSE) 2 MG/3ML ~~LOC~~ SOPN
PEN_INJECTOR | SUBCUTANEOUS | 5 refills | Status: DC
  Filled 2023-07-20: qty 3, 28d supply, fill #0
  Filled 2023-08-18: qty 3, 28d supply, fill #1
  Filled 2023-09-15: qty 3, 28d supply, fill #2

## 2023-07-21 ENCOUNTER — Other Ambulatory Visit (HOSPITAL_COMMUNITY): Payer: Self-pay

## 2023-07-25 ENCOUNTER — Other Ambulatory Visit (HOSPITAL_COMMUNITY): Payer: Self-pay

## 2023-07-26 ENCOUNTER — Other Ambulatory Visit (HOSPITAL_COMMUNITY): Payer: Self-pay

## 2023-07-29 ENCOUNTER — Other Ambulatory Visit: Payer: Self-pay | Admitting: Internal Medicine

## 2023-07-29 ENCOUNTER — Other Ambulatory Visit: Payer: Self-pay

## 2023-07-30 ENCOUNTER — Other Ambulatory Visit (HOSPITAL_COMMUNITY): Payer: Self-pay

## 2023-07-30 MED ORDER — DOXAZOSIN MESYLATE 8 MG PO TABS
4.0000 mg | ORAL_TABLET | Freq: Every day | ORAL | 1 refills | Status: DC
  Filled 2023-07-30: qty 90, 90d supply, fill #0
  Filled 2023-10-31: qty 90, 90d supply, fill #1

## 2023-08-07 ENCOUNTER — Telehealth: Payer: Self-pay

## 2023-08-07 NOTE — Telephone Encounter (Signed)
 Copied from CRM [id number]. Topic: Clinical - Medication Question >> Aug 07, 2023  4:42 PM Victor Byrd wrote: Reason for CRM: pt called to speak with provider regarding decreasing his medication.  carvedilol (COREG) 6.25 MG tablet pt would like to change it to once per day. cloNIDine (CATAPRES) 0.2 MG tablet pt states he cut it down to once per day instead of 2 times per day . Please call pt back at cloNIDine (CATAPRES) 0.2 MG tablet [[id number]]

## 2023-08-08 ENCOUNTER — Encounter: Payer: Self-pay | Admitting: Internal Medicine

## 2023-08-11 NOTE — Telephone Encounter (Signed)
 Pt has stated "I have gone back to a somewhat normal life and am not as stressed. In that end, my blood pressure has been lower than usual. I have been having dizzy spells and was wondering if I should decrease my meds. I was taking clonidine and carvedilol twice a day. I decreased my clonidine to only once a day. I need to know if it's okay to decrease the carvedilol? If not, I would welcome any suggestions. Thank you"

## 2023-08-12 ENCOUNTER — Other Ambulatory Visit (HOSPITAL_COMMUNITY): Payer: Self-pay

## 2023-08-12 NOTE — Telephone Encounter (Signed)
 Both of these medicines are supposed to be taken twice a day.  He can take Coreg 300.125 mg twice daily and clonidine 0.1 mg twice daily.  Please see me for follow-up.  Thanks

## 2023-08-13 NOTE — Telephone Encounter (Signed)
 Please inform pt of the following upon his call back "Both of these medicines are supposed to be taken twice a day.  He can take Coreg 300.125 mg twice daily and clonidine 0.1 mg twice daily.  Please see me for follow-up.  Thanks "

## 2023-08-18 ENCOUNTER — Other Ambulatory Visit (HOSPITAL_COMMUNITY): Payer: Self-pay

## 2023-08-22 DIAGNOSIS — E119 Type 2 diabetes mellitus without complications: Secondary | ICD-10-CM | POA: Diagnosis not present

## 2023-08-22 LAB — HM DIABETES EYE EXAM

## 2023-08-26 ENCOUNTER — Ambulatory Visit: Admitting: Internal Medicine

## 2023-08-28 ENCOUNTER — Other Ambulatory Visit (HOSPITAL_COMMUNITY): Payer: Self-pay

## 2023-09-01 ENCOUNTER — Other Ambulatory Visit (HOSPITAL_COMMUNITY): Payer: Self-pay

## 2023-09-01 ENCOUNTER — Other Ambulatory Visit: Payer: Self-pay | Admitting: Cardiology

## 2023-09-01 MED ORDER — CARVEDILOL 6.25 MG PO TABS
6.2500 mg | ORAL_TABLET | Freq: Two times a day (BID) | ORAL | 0 refills | Status: DC
  Filled 2023-09-01: qty 60, 30d supply, fill #0

## 2023-09-16 ENCOUNTER — Other Ambulatory Visit (HOSPITAL_COMMUNITY): Payer: Self-pay

## 2023-09-16 MED ORDER — OZEMPIC (1 MG/DOSE) 4 MG/3ML ~~LOC~~ SOPN
1.0000 mg | PEN_INJECTOR | SUBCUTANEOUS | 5 refills | Status: DC
  Filled 2023-09-16 – 2023-10-09 (×2): qty 3, 28d supply, fill #0
  Filled 2023-11-25: qty 3, 28d supply, fill #1
  Filled 2024-01-05: qty 3, 28d supply, fill #2
  Filled 2024-02-21: qty 3, 28d supply, fill #3
  Filled 2024-03-28: qty 3, 28d supply, fill #4
  Filled 2024-05-11: qty 3, 28d supply, fill #5

## 2023-09-20 ENCOUNTER — Other Ambulatory Visit: Payer: Self-pay | Admitting: Internal Medicine

## 2023-09-20 MED ORDER — CLONIDINE HCL 0.2 MG PO TABS
0.2000 mg | ORAL_TABLET | Freq: Two times a day (BID) | ORAL | 5 refills | Status: AC
  Filled 2023-09-20: qty 60, 30d supply, fill #0
  Filled 2023-11-20: qty 60, 30d supply, fill #1
  Filled 2024-02-01: qty 60, 30d supply, fill #2
  Filled 2024-03-27: qty 60, 30d supply, fill #3
  Filled 2024-05-11: qty 60, 30d supply, fill #4
  Filled 2024-06-02: qty 60, 30d supply, fill #5

## 2023-09-22 ENCOUNTER — Other Ambulatory Visit (HOSPITAL_COMMUNITY): Payer: Self-pay

## 2023-09-26 ENCOUNTER — Other Ambulatory Visit (HOSPITAL_COMMUNITY): Payer: Self-pay

## 2023-10-09 ENCOUNTER — Other Ambulatory Visit (HOSPITAL_COMMUNITY): Payer: Self-pay

## 2023-10-28 ENCOUNTER — Other Ambulatory Visit: Payer: Self-pay | Admitting: Internal Medicine

## 2023-10-29 ENCOUNTER — Other Ambulatory Visit (HOSPITAL_COMMUNITY): Payer: Self-pay

## 2023-10-29 MED ORDER — TESTOSTERONE CYPIONATE 200 MG/ML IM SOLN
200.0000 mg | INTRAMUSCULAR | 1 refills | Status: DC
  Filled 2023-10-29: qty 6, 84d supply, fill #0
  Filled 2024-04-05: qty 10, 90d supply, fill #1

## 2023-10-31 ENCOUNTER — Other Ambulatory Visit (HOSPITAL_COMMUNITY): Payer: Self-pay

## 2023-11-04 ENCOUNTER — Other Ambulatory Visit (HOSPITAL_COMMUNITY): Payer: Self-pay

## 2023-11-04 ENCOUNTER — Other Ambulatory Visit (HOSPITAL_BASED_OUTPATIENT_CLINIC_OR_DEPARTMENT_OTHER): Payer: Self-pay

## 2023-11-04 MED ORDER — DEXCOM G6 SENSOR MISC
5 refills | Status: DC
  Filled 2023-11-04: qty 9, 90d supply, fill #0

## 2023-11-06 ENCOUNTER — Other Ambulatory Visit: Payer: Self-pay

## 2023-11-06 ENCOUNTER — Other Ambulatory Visit (HOSPITAL_COMMUNITY): Payer: Self-pay

## 2023-11-06 MED ORDER — DEXCOM G7 SENSOR MISC
5 refills | Status: AC
  Filled 2023-11-06: qty 9, 90d supply, fill #0
  Filled 2023-11-07 (×2): qty 3, 30d supply, fill #0
  Filled 2023-11-26 – 2023-12-01 (×3): qty 3, 30d supply, fill #1
  Filled 2024-01-12: qty 3, 30d supply, fill #2
  Filled 2024-01-28 – 2024-02-06 (×4): qty 3, 30d supply, fill #3
  Filled 2024-05-11 – 2024-05-12 (×2): qty 3, 30d supply, fill #4

## 2023-11-07 ENCOUNTER — Other Ambulatory Visit (HOSPITAL_COMMUNITY): Payer: Self-pay

## 2023-11-10 ENCOUNTER — Other Ambulatory Visit: Payer: Self-pay | Admitting: Cardiology

## 2023-11-11 ENCOUNTER — Other Ambulatory Visit (HOSPITAL_COMMUNITY): Payer: Self-pay

## 2023-11-11 MED ORDER — CARVEDILOL 6.25 MG PO TABS
6.2500 mg | ORAL_TABLET | Freq: Two times a day (BID) | ORAL | 0 refills | Status: DC
Start: 2023-11-11 — End: 2023-12-12
  Filled 2023-11-11: qty 30, 15d supply, fill #0

## 2023-11-26 ENCOUNTER — Other Ambulatory Visit (HOSPITAL_COMMUNITY): Payer: Self-pay

## 2023-11-27 ENCOUNTER — Other Ambulatory Visit (HOSPITAL_COMMUNITY): Payer: Self-pay

## 2023-12-01 ENCOUNTER — Other Ambulatory Visit: Payer: Self-pay

## 2023-12-05 ENCOUNTER — Other Ambulatory Visit (HOSPITAL_COMMUNITY): Payer: Self-pay

## 2023-12-06 ENCOUNTER — Other Ambulatory Visit (HOSPITAL_COMMUNITY): Payer: Self-pay

## 2023-12-06 MED ORDER — METFORMIN HCL 1000 MG PO TABS
1000.0000 mg | ORAL_TABLET | Freq: Two times a day (BID) | ORAL | 4 refills | Status: AC
  Filled 2023-12-06: qty 180, 90d supply, fill #0
  Filled 2024-03-03: qty 180, 90d supply, fill #1
  Filled 2024-06-02: qty 180, 90d supply, fill #2

## 2023-12-08 ENCOUNTER — Other Ambulatory Visit (HOSPITAL_COMMUNITY): Payer: Self-pay

## 2023-12-12 ENCOUNTER — Other Ambulatory Visit (HOSPITAL_COMMUNITY): Payer: Self-pay

## 2023-12-12 ENCOUNTER — Other Ambulatory Visit: Payer: Self-pay | Admitting: Cardiology

## 2023-12-12 ENCOUNTER — Other Ambulatory Visit: Payer: Self-pay | Admitting: Internal Medicine

## 2023-12-15 ENCOUNTER — Other Ambulatory Visit (HOSPITAL_COMMUNITY): Payer: Self-pay

## 2023-12-15 ENCOUNTER — Other Ambulatory Visit: Payer: Self-pay

## 2023-12-15 MED ORDER — CARVEDILOL 6.25 MG PO TABS
6.2500 mg | ORAL_TABLET | Freq: Two times a day (BID) | ORAL | 0 refills | Status: DC
  Filled 2023-12-15: qty 30, 15d supply, fill #0

## 2023-12-16 ENCOUNTER — Encounter: Payer: Self-pay | Admitting: Cardiology

## 2023-12-16 ENCOUNTER — Other Ambulatory Visit (HOSPITAL_COMMUNITY): Payer: Self-pay

## 2023-12-16 ENCOUNTER — Other Ambulatory Visit: Payer: Self-pay

## 2023-12-16 MED ORDER — VILAZODONE HCL 20 MG PO TABS
20.0000 mg | ORAL_TABLET | Freq: Every day | ORAL | 6 refills | Status: AC
  Filled 2023-12-16: qty 30, 30d supply, fill #0
  Filled 2024-01-24: qty 30, 30d supply, fill #1
  Filled 2024-02-24: qty 30, 30d supply, fill #2
  Filled 2024-03-27: qty 30, 30d supply, fill #3
  Filled 2024-04-22: qty 30, 30d supply, fill #4
  Filled 2024-05-25: qty 30, 30d supply, fill #5
  Filled 2024-06-02 – 2024-06-27 (×3): qty 30, 30d supply, fill #6

## 2023-12-16 NOTE — Telephone Encounter (Signed)
 Error

## 2023-12-26 ENCOUNTER — Other Ambulatory Visit (HOSPITAL_COMMUNITY): Payer: Self-pay

## 2023-12-26 DIAGNOSIS — G609 Hereditary and idiopathic neuropathy, unspecified: Secondary | ICD-10-CM | POA: Diagnosis not present

## 2023-12-26 DIAGNOSIS — E1165 Type 2 diabetes mellitus with hyperglycemia: Secondary | ICD-10-CM | POA: Diagnosis not present

## 2023-12-26 DIAGNOSIS — I10 Essential (primary) hypertension: Secondary | ICD-10-CM | POA: Diagnosis not present

## 2023-12-26 DIAGNOSIS — R809 Proteinuria, unspecified: Secondary | ICD-10-CM | POA: Diagnosis not present

## 2023-12-26 DIAGNOSIS — E7800 Pure hypercholesterolemia, unspecified: Secondary | ICD-10-CM | POA: Diagnosis not present

## 2023-12-26 MED ORDER — INSULIN GLARGINE-YFGN 100 UNIT/ML ~~LOC~~ SOPN
40.0000 [IU] | PEN_INJECTOR | Freq: Two times a day (BID) | SUBCUTANEOUS | 5 refills | Status: DC
  Filled 2023-12-26: qty 30, 38d supply, fill #0

## 2024-01-05 ENCOUNTER — Other Ambulatory Visit (HOSPITAL_COMMUNITY): Payer: Self-pay

## 2024-01-12 ENCOUNTER — Other Ambulatory Visit (HOSPITAL_BASED_OUTPATIENT_CLINIC_OR_DEPARTMENT_OTHER): Payer: Self-pay

## 2024-01-17 ENCOUNTER — Other Ambulatory Visit: Payer: Self-pay | Admitting: Cardiology

## 2024-01-19 ENCOUNTER — Other Ambulatory Visit (HOSPITAL_COMMUNITY): Payer: Self-pay

## 2024-01-19 ENCOUNTER — Other Ambulatory Visit: Payer: Self-pay

## 2024-01-19 MED ORDER — CARVEDILOL 6.25 MG PO TABS
6.2500 mg | ORAL_TABLET | Freq: Two times a day (BID) | ORAL | 0 refills | Status: DC
  Filled 2024-01-19: qty 30, 15d supply, fill #0

## 2024-01-20 ENCOUNTER — Other Ambulatory Visit (HOSPITAL_COMMUNITY): Payer: Self-pay

## 2024-01-28 ENCOUNTER — Other Ambulatory Visit (HOSPITAL_COMMUNITY): Payer: Self-pay

## 2024-01-30 ENCOUNTER — Other Ambulatory Visit (HOSPITAL_COMMUNITY): Payer: Self-pay

## 2024-01-31 NOTE — Progress Notes (Unsigned)
 Cardiology Office Note:    Date:  02/05/2024   ID:  Victor Byrd, DOB [birth year], MRN [id number]  PCP:  Garald Karlynn GAILS, MD   [facility] Providers Cardiologist:  Victor Currie [place], MD Cardiology APP:  Madie Jon Garre, PA     Referring MD: Garald Karlynn GAILS, MD   Chief Complaint  Patient presents with   Coronary Artery Disease   Hypertension    History of Present Illness:    Victor Byrd is a 58 y.o. male with a hx of severe HTN and elevated coronary calcium  score, morbid obesity, OSA, HTN, HLD, and DM2. Renal dopplers negative for RAS. Stress echo in 2013 was normal. Calcium  score in 10/2020 was 161 placing him in the 88th percentile.  He was seen on 10/05/2021 with hypertensive urgency and documented home BPs of 205/101.  .  Coreg  was added with good response  On subsequent follow up  his BP control has been good.  Had sleep study recently showing mild to moderate OSA - Auto- titration CPAP trial ordered by Dr Jude. To further evaluate we performed a Cardiac PET CT which showed normal perfusion. EF 46%. Subsequent Echo showed EF was normal.   He has done very well. On Ozempic  now and has lost 60 lbs. A1c down to 7.1%. LDL is at goal. He is working a different job now- less stress. Denies any chest pain. Still gets SOB on job. Notes some fatigue. Still using CPAP. Cut coreg  and clonidine  to once a day since BP low.    Past Medical History:  Diagnosis Date   Anxiety    Asthma    Depression    Diabetes mellitus type II    Hyperlipidemia    Hypertension    Hyperthyroidism    Pt denies thyroid  issues   OSA on CPAP    Dr. Tommas   Restless legs    Sleep apnea    cpap    Past Surgical History:  Procedure Laterality Date   COLONOSCOPY  11/01/2020   Victor Naval, MD   HERNIA REPAIR  1972 & 1987   VASECTOMY  2007   WISDOM TOOTH EXTRACTION  1992    Current Medications: Current Meds  Medication Sig   albuterol  (VENTOLIN  HFA) 108 (90 Base) MCG/ACT inhaler  Inhale 2 puffs into the lungs every 6 (six) hours as needed for wheezing   amLODipine  (NORVASC ) 10 MG tablet Take 1 tablet (10 mg total) by mouth daily.   aspirin 81 MG tablet Take 81 mg by mouth daily.   atorvastatin  (LIPITOR) 20 MG tablet Take 1 tablet (20 mg total) by mouth daily.   buPROPion  (WELLBUTRIN  XL) 300 MG 24 hr tablet Take 1 tablet (300 mg total) by mouth daily.   cloNIDine  (CATAPRES ) 0.2 MG tablet Take 1 tablet by mouth 2 times daily. (Patient taking differently: Take 0.2 mg by mouth daily.)   Continuous Blood Gluc Sensor (DEXCOM G7 SENSOR) MISC Use as directed every 10 days   doxazosin  (CARDURA ) 8 MG tablet Take 0.5-1 tablets (4-8 mg total) by mouth daily.   furosemide  (LASIX ) 20 MG tablet Take 1 - 2 tablets (20 - 40 mg total) by mouth daily as needed.   Ginkgo Biloba 40 MG TABS Take by mouth.   glucose blood test strip Use to test blood sugar up to three times daily as needed.   insulin  glargine-yfgn (SEMGLEE , YFGN,) 100 UNIT/ML Pen Inject 30 units under the skin twice a day (Patient taking differently: Inject 45 Units  into the skin 2 (two) times daily.)   insulin  glargine-yfgn (SEMGLEE , YFGN,) 100 UNIT/ML Pen Inject 60 Units into the skin daily.   Lancets (FREESTYLE) lancets Use as directed to test blood sugar up to 3 times a day as needed.   magnesium 30 MG tablet Take 30 mg by mouth daily.   metFORMIN  (GLUCOPHAGE ) 1000 MG tablet Take 1 tablet by mouth 2 times a day   Multiple Vitamin (MULTIVITAMIN WITH MINERALS) TABS tablet Take 1 tablet by mouth daily.   NEEDLE, DISP, 23 G (B-D DISP NEEDLE 23GX1-1/4) 23G X 1-1/4 MISC Use for testosterone  injections   Omega-3 Fatty Acids (FISH OIL) 1000 MG CAPS 1 capsule (Patient taking differently: daily.)   Semaglutide  (RYBELSUS ) 14 MG TABS Take 1 tablet once daily at least 30 minutes before first food, beverage or other oral medicine of the day   Semaglutide  (RYBELSUS ) 7 MG TABS Take 1 tablet by mouth daily with 4 oz of water on an empty  stomach 30 minutes before breakfast or other meds   Semaglutide , 1 MG/DOSE, (OZEMPIC , 1 MG/DOSE,) 4 MG/3ML SOPN Inject 1 mg into the skin once a week.   sildenafil  (VIAGRA ) 25 MG tablet Take 25 mg by mouth daily as needed for erectile dysfunction.   tadalafil  (CIALIS ) 20 MG tablet Take 1 tablet (20 mg total) by mouth daily as needed for erectile dysfunction.   telmisartan -hydrochlorothiazide  (MICARDIS  HCT) 80-25 MG tablet Take 1 tablet by mouth daily.   testosterone  cypionate (DEPOTESTOSTERONE CYPIONATE) 200 MG/ML injection Inject 1 mL (200 mg total) into the muscle every 14 days.   Vilazodone  HCl (VIIBRYD ) 20 MG TABS Take 1 tablet (20 mg total) by mouth daily.   [DISCONTINUED] carvedilol  (COREG ) 6.25 MG tablet Take 1 tablet (6.25 mg total) by mouth 2 (two) times daily with a meal. Please keep upcoming appointment in September for future refills. Thank you. (Patient taking differently: Take 6.25 mg by mouth daily at 2 am.)     Allergies:   Dapagliflozin and Ramipril   Social History   Socioeconomic History   Marital status: Married    Spouse name: Not on file   Number of children: 2   Years of education: Not on file   Highest education level: Not on file  Occupational History   Occupation: Haematologist: UNEMPLOYED    Comment: out of work  Tobacco Use   Smoking status: Never   Smokeless tobacco: Never  Vaping Use   Vaping status: Never Used  Substance and Sexual Activity   Alcohol use: Yes    Alcohol/week: 0.0 standard drinks of alcohol   Drug use: No   Sexual activity: Yes  Other Topics Concern   Not on file  Social History Narrative   Not on file   Social Drivers of Health   Financial Resource Strain: Not on file  Food Insecurity: Not on file  Transportation Needs: Not on file  Physical Activity: Not on file  Stress: Not on file  Social Connections: Not on file     Family History: The patient's family history includes Colon polyps in his father; Diabetes in  his mother and another family member; Heart attack in his father; Heart disease in his father; Hyperlipidemia in an other family member; Hypertension in his father and another family member; Lung cancer in an other family member. There is no history of Colon cancer, Esophageal cancer, Rectal cancer, or Stomach cancer.  ROS:   Please see the history of present illness.  All other systems reviewed and are negative.  EKGs/Labs/Other Studies Reviewed:    The following studies were reviewed today:  CT calcium  score 10/27/20:  IMPRESSION: Coronary calcium  score of 161. This was 88th percentile for age-, race-, and sex-matched controls.  Cardiac PET CT: 04/16/22:   The study is normal. The study is low risk.   Rest left ventricular function is abnormal. Rest global function is mildly reduced. Rest EF: 46 %. Stress left ventricular function is normal. Stress EF: 52 %. End diastolic cavity size is normal. End systolic cavity size is normal.   Myocardial blood flow was computed to be 0.90ml/g/min at rest and 1.52ml/g/min at stress. Global myocardial blood flow reserve was 2.62 and was normal.   Coronary calcium  was present on the attenuation correction CT images. Moderate coronary calcifications were present. Coronary calcifications were present in the left anterior descending artery, left circumflex artery and right coronary artery distribution(s).   CLINICAL DATA:  This over-read does not include interpretation of cardiac or coronary anatomy or pathology. The Cardiac PET CT interpretation by the cardiologist is attached.   COMPARISON:  Coronary calcium  score November 2022.   FINDINGS: Vascular: Please see dedicated report for cardiovascular details.   Mediastinum/Nodes: No adenopathy or acute process in the mediastinum.   Lungs/Pleura: Airways are patent to the extent evaluated. Lungs are clear to the extent evaluated in there is no sign of pleural effusion.   Upper Abdomen: Incidental  imaging of upper abdominal contents without acute process.   Musculoskeletal: No acute bone finding. No destructive bone process. Spinal degenerative changes.   IMPRESSION: No acute findings or significant extracardiac findings.     Electronically Signed   By: Isla Blind M.D.   On: 04/16/2022 16:21    Echo 07/02/22: IMPRESSIONS     1. Left ventricular ejection fraction, by estimation, is 60 to 65%. The  left ventricle has normal function. The left ventricle has no regional  wall motion abnormalities. There is mild left ventricular hypertrophy.  Left ventricular diastolic parameters  were normal.   2. Respiratory related ventricular septal shift, may indicate  constrictive physiology vs pulmonary process.   3. Increased flow velocity across pulmonary valve. Valve is not well  visualized. Mean gradient 10 mmHg, peak velocity 1.5 m/s.   4. Trivial pericardial effusion.   5. Right ventricular systolic function is normal. The right ventricular  size is normal. Tricuspid regurgitation signal is inadequate for assessing  PA pressure.   6. The mitral valve is normal in structure. Trivial mitral valve  regurgitation. No evidence of mitral stenosis.   7. The aortic valve is grossly normal. There is mild calcification of the  aortic valve. Aortic valve regurgitation is not visualized. No aortic  stenosis is present.   8. IVC not visualized.    EKG Interpretation Date/Time:  Thursday February 05 2024 09:15:20 EDT Ventricular Rate:  83 PR Interval:  174 QRS Duration:  92 QT Interval:  360 QTC Calculation: 423 R Axis:   93  Text Interpretation: Normal sinus rhythm Rightward axis When compared with ECG of Oct 05, 2021 No significant change was found Confirmed by Byrd, Dareion Kneece [phone]) on 02/05/2024 9:17:23 AM    Recent Labs: No results found for requested labs within last 365 days.  Recent Lipid Panel    Component Value Date/Time   CHOL 102 01/17/2023 1101   CHOL 116  06/06/2022 0854   TRIG 92.0 01/17/2023 1101   HDL 33.40 (L) 01/17/2023 1101   HDL 36 (  L) 06/06/2022 0854   CHOLHDL 3 01/17/2023 1101   VLDL 18.4 01/17/2023 1101   LDLCALC 50 01/17/2023 1101   LDLCALC 55 06/06/2022 0854   LDLDIRECT 96.0 01/11/2022 0958     Risk Assessment/Calculations:           Physical Exam:    VS:  BP 110/72   Pulse 83   Ht 6' (1.829 m)   Wt 262 lb (118.8 kg)   SpO2 97%   BMI 35.53 kg/m     Wt Readings from Last 3 Encounters:  02/05/24 262 lb (118.8 kg)  01/13/23 277 lb (125.6 kg)  11/29/22 288 lb 8 oz (130.9 kg)     GEN: morbidly obese male in NAD HEENT: Normal NECK: No JVD; No carotid bruits LYMPHATICS: No lymphadenopathy CARDIAC: RRR, no murmurs, rubs, gallops RESPIRATORY:  clear  ABDOMEN: Soft, non-tender, non-distended MUSCULOSKELETAL:  no  edema; No deformity  SKIN: Warm and dry NEUROLOGIC:  Alert and oriented x 3 PSYCHIATRIC:  Normal affect   ASSESSMENT:    1. Coronary artery disease involving native coronary artery of native heart with other form of angina pectoris (HCC)   2. Primary hypertension   3. Morbid obesity with BMI of 40.0-44.9, adult (HCC)   4. OSA (obstructive sleep apnea)   5. Hypercholesteremia       PLAN:    In order of problems listed above:  Hx of poorly controlled HTN - no evidence RAS - maintained on norvasc , clonidine , telmisartan -HCTZ, cardura , and carvedilol  - Pressure is now with excellent control - will discontinue Coreg  now.  - if BP continues to do well may be able to tailor other therapy as well.    2. Hyperlipidemia with LDL goal < 70 - last LDL at goal  59  - continue lipitor   3. Coronary artery calcifications Aortic atherosclerosis Elevated coronary calcium  score Continue risk factor modification Continue ASA Normal perfusion by Cardiac PET CT   5. Morbid obesity with OSA - per Dr Jude - on CPAP  6. DM. A1c 7.1% significantly improved.   Medication Adjustments/Labs and  Tests Ordered: Current medicines are reviewed at length with the patient today.  Concerns regarding medicines are outlined above.  Orders Placed This Encounter  Procedures   EKG 12-Lead   No orders of the defined types were placed in this encounter.   There are no Patient Instructions on file for this visit.   Follow up in one year  Signed, Merissa Renwick [place], MD  02/05/2024 9:25 AM    [facility]

## 2024-02-01 ENCOUNTER — Other Ambulatory Visit: Payer: Self-pay | Admitting: Internal Medicine

## 2024-02-02 ENCOUNTER — Other Ambulatory Visit (HOSPITAL_COMMUNITY): Payer: Self-pay

## 2024-02-02 MED ORDER — DOXAZOSIN MESYLATE 8 MG PO TABS
4.0000 mg | ORAL_TABLET | Freq: Every day | ORAL | 1 refills | Status: AC
  Filled 2024-02-02: qty 90, 90d supply, fill #0
  Filled 2024-05-01: qty 90, 90d supply, fill #1

## 2024-02-03 ENCOUNTER — Other Ambulatory Visit (HOSPITAL_COMMUNITY): Payer: Self-pay

## 2024-02-04 ENCOUNTER — Other Ambulatory Visit (HOSPITAL_COMMUNITY): Payer: Self-pay

## 2024-02-05 ENCOUNTER — Encounter: Payer: Self-pay | Admitting: Cardiology

## 2024-02-05 ENCOUNTER — Ambulatory Visit: Payer: Self-pay | Attending: Cardiology | Admitting: Cardiology

## 2024-02-05 VITALS — BP 110/72 | HR 83 | Ht 72.0 in | Wt 262.0 lb

## 2024-02-05 DIAGNOSIS — E7800 Pure hypercholesterolemia, unspecified: Secondary | ICD-10-CM | POA: Diagnosis not present

## 2024-02-05 DIAGNOSIS — G4733 Obstructive sleep apnea (adult) (pediatric): Secondary | ICD-10-CM

## 2024-02-05 DIAGNOSIS — Z6841 Body Mass Index (BMI) 40.0 and over, adult: Secondary | ICD-10-CM | POA: Diagnosis not present

## 2024-02-05 DIAGNOSIS — I25118 Atherosclerotic heart disease of native coronary artery with other forms of angina pectoris: Secondary | ICD-10-CM

## 2024-02-05 DIAGNOSIS — I10 Essential (primary) hypertension: Secondary | ICD-10-CM

## 2024-02-05 NOTE — Patient Instructions (Signed)
 Medication Instructions:  Stop Coreg    Continue all other medications  *If you need a refill on your cardiac medications before your next appointment, please call your pharmacy*  Lab Work: None ordered  Testing/Procedures: None ordered  Follow-Up: At [facility], you and your health needs are our priority.  As part of our continuing mission to provide you with exceptional heart care, our providers are all part of one team.  This team includes your primary Cardiologist (physician) and Advanced Practice Providers or APPs (Physician Assistants and Nurse Practitioners) who all work together to provide you with the care you need, when you need it.  Your next appointment:  1 year    Call in May to schedule Sept appointment     Provider:  Dr.Jordan    We recommend signing up for the patient portal called MyChart.  Sign up information is provided on this After Visit Summary.  MyChart is used to connect with patients for Virtual Visits (Telemedicine).  Patients are able to view lab/test results, encounter notes, upcoming appointments, etc.  Non-urgent messages can be sent to your provider as well.   To learn more about what you can do with MyChart, go to [email or web address].

## 2024-02-06 ENCOUNTER — Other Ambulatory Visit (HOSPITAL_COMMUNITY): Payer: Self-pay

## 2024-02-06 MED ORDER — DEXCOM G7 SENSOR MISC
1.0000 | 5 refills | Status: AC
  Filled 2024-03-01: qty 9, 90d supply, fill #0
  Filled 2024-06-02: qty 9, 90d supply, fill #1

## 2024-02-09 ENCOUNTER — Other Ambulatory Visit: Payer: Self-pay | Admitting: Cardiology

## 2024-02-10 ENCOUNTER — Other Ambulatory Visit (HOSPITAL_COMMUNITY): Payer: Self-pay

## 2024-02-10 MED ORDER — ATORVASTATIN CALCIUM 20 MG PO TABS
20.0000 mg | ORAL_TABLET | Freq: Every day | ORAL | 3 refills | Status: AC
  Filled 2024-02-10: qty 90, 90d supply, fill #0
  Filled 2024-05-08: qty 90, 90d supply, fill #1
  Filled 2024-06-02: qty 90, 90d supply, fill #2

## 2024-02-26 ENCOUNTER — Other Ambulatory Visit (HOSPITAL_COMMUNITY): Payer: Self-pay

## 2024-03-01 ENCOUNTER — Other Ambulatory Visit (HOSPITAL_COMMUNITY): Payer: Self-pay

## 2024-03-03 ENCOUNTER — Other Ambulatory Visit: Payer: Self-pay | Admitting: Internal Medicine

## 2024-03-04 ENCOUNTER — Other Ambulatory Visit: Payer: Self-pay

## 2024-03-04 ENCOUNTER — Other Ambulatory Visit (HOSPITAL_COMMUNITY): Payer: Self-pay

## 2024-03-04 MED ORDER — BUPROPION HCL ER (XL) 300 MG PO TB24
300.0000 mg | ORAL_TABLET | Freq: Every day | ORAL | 1 refills | Status: AC
  Filled 2024-03-04: qty 90, 90d supply, fill #0
  Filled 2024-06-02: qty 90, 90d supply, fill #1

## 2024-03-11 ENCOUNTER — Other Ambulatory Visit: Payer: Self-pay | Admitting: Cardiology

## 2024-03-12 ENCOUNTER — Other Ambulatory Visit (HOSPITAL_COMMUNITY): Payer: Self-pay

## 2024-03-12 MED ORDER — TELMISARTAN-HCTZ 80-25 MG PO TABS
1.0000 | ORAL_TABLET | Freq: Every day | ORAL | 3 refills | Status: AC
  Filled 2024-03-12: qty 90, 90d supply, fill #0
  Filled 2024-06-02: qty 90, 90d supply, fill #1

## 2024-03-24 ENCOUNTER — Other Ambulatory Visit (HOSPITAL_COMMUNITY): Payer: Self-pay

## 2024-03-25 ENCOUNTER — Other Ambulatory Visit: Payer: Self-pay

## 2024-03-25 ENCOUNTER — Other Ambulatory Visit (HOSPITAL_COMMUNITY): Payer: Self-pay

## 2024-03-25 MED ORDER — LANTUS SOLOSTAR 100 UNIT/ML ~~LOC~~ SOPN
PEN_INJECTOR | SUBCUTANEOUS | 5 refills | Status: AC
  Filled 2024-03-25: qty 15, 37d supply, fill #0
  Filled 2024-05-11: qty 15, fill #0

## 2024-03-25 MED ORDER — LANTUS SOLOSTAR 100 UNIT/ML ~~LOC~~ SOPN
40.0000 [IU] | PEN_INJECTOR | Freq: Two times a day (BID) | SUBCUTANEOUS | 5 refills | Status: AC
  Filled 2024-03-25: qty 72, 90d supply, fill #0
  Filled 2024-03-26: qty 24, 30d supply, fill #0
  Filled 2024-05-05: qty 24, 30d supply, fill #1

## 2024-03-26 ENCOUNTER — Other Ambulatory Visit (HOSPITAL_COMMUNITY): Payer: Self-pay

## 2024-03-29 ENCOUNTER — Other Ambulatory Visit (HOSPITAL_COMMUNITY): Payer: Self-pay

## 2024-03-29 ENCOUNTER — Other Ambulatory Visit: Payer: Self-pay

## 2024-03-29 MED ORDER — LANTUS SOLOSTAR 100 UNIT/ML ~~LOC~~ SOPN
40.0000 [IU] | PEN_INJECTOR | Freq: Two times a day (BID) | SUBCUTANEOUS | 5 refills | Status: AC
  Filled 2024-03-29: qty 15, 19d supply, fill #0

## 2024-04-02 ENCOUNTER — Other Ambulatory Visit (HOSPITAL_COMMUNITY): Payer: Self-pay

## 2024-04-05 ENCOUNTER — Other Ambulatory Visit (HOSPITAL_COMMUNITY): Payer: Self-pay

## 2024-04-05 ENCOUNTER — Other Ambulatory Visit: Payer: Self-pay

## 2024-05-05 ENCOUNTER — Other Ambulatory Visit (HOSPITAL_COMMUNITY): Payer: Self-pay

## 2024-05-11 ENCOUNTER — Other Ambulatory Visit (HOSPITAL_COMMUNITY): Payer: Self-pay

## 2024-05-11 ENCOUNTER — Other Ambulatory Visit: Payer: Self-pay

## 2024-05-11 MED ORDER — TRUEPLUS 5-BEVEL PEN NEEDLES 29G X 12.7MM MISC
5 refills | Status: AC
  Filled 2024-05-11: qty 200, 40d supply, fill #0

## 2024-05-12 ENCOUNTER — Other Ambulatory Visit (HOSPITAL_COMMUNITY): Payer: Self-pay

## 2024-05-27 ENCOUNTER — Other Ambulatory Visit: Payer: Self-pay | Admitting: Internal Medicine

## 2024-05-28 ENCOUNTER — Other Ambulatory Visit (HOSPITAL_COMMUNITY): Payer: Self-pay

## 2024-05-28 MED ORDER — TADALAFIL 20 MG PO TABS
20.0000 mg | ORAL_TABLET | Freq: Every day | ORAL | 3 refills | Status: AC | PRN
  Filled 2024-05-28: qty 6, 30d supply, fill #0
  Filled 2024-06-02: qty 6, 30d supply, fill #1

## 2024-06-02 ENCOUNTER — Other Ambulatory Visit: Payer: Self-pay | Admitting: Internal Medicine

## 2024-06-02 ENCOUNTER — Other Ambulatory Visit (HOSPITAL_COMMUNITY): Payer: Self-pay

## 2024-06-02 MED ORDER — AMLODIPINE BESYLATE 10 MG PO TABS
10.0000 mg | ORAL_TABLET | Freq: Every day | ORAL | 11 refills | Status: AC
  Filled 2024-06-02 – 2024-06-18 (×2): qty 30, 30d supply, fill #0

## 2024-06-02 MED ORDER — OZEMPIC (1 MG/DOSE) 4 MG/3ML ~~LOC~~ SOPN
1.0000 mg | PEN_INJECTOR | SUBCUTANEOUS | 5 refills | Status: AC
  Filled 2024-06-02 – 2024-07-04 (×2): qty 3, 28d supply, fill #0

## 2024-06-02 MED ORDER — BASAGLAR KWIKPEN 100 UNIT/ML ~~LOC~~ SOPN
40.0000 [IU] | PEN_INJECTOR | Freq: Two times a day (BID) | SUBCUTANEOUS | 5 refills | Status: AC
  Filled 2024-06-02: qty 24, 30d supply, fill #0

## 2024-06-04 ENCOUNTER — Other Ambulatory Visit (HOSPITAL_COMMUNITY): Payer: Self-pay

## 2024-06-04 ENCOUNTER — Other Ambulatory Visit: Payer: Self-pay

## 2024-06-05 ENCOUNTER — Other Ambulatory Visit (HOSPITAL_COMMUNITY): Payer: Self-pay

## 2024-06-07 ENCOUNTER — Other Ambulatory Visit (HOSPITAL_COMMUNITY): Payer: Self-pay

## 2024-06-07 MED ORDER — TESTOSTERONE CYPIONATE 200 MG/ML IM SOLN
200.0000 mg | INTRAMUSCULAR | 1 refills | Status: AC
  Filled 2024-06-07: qty 10, 140d supply, fill #0

## 2024-06-07 MED ORDER — LANTUS SOLOSTAR 100 UNIT/ML ~~LOC~~ SOPN
40.0000 [IU] | PEN_INJECTOR | Freq: Two times a day (BID) | SUBCUTANEOUS | 5 refills | Status: AC
  Filled 2024-06-07: qty 15, 19d supply, fill #0

## 2024-06-11 ENCOUNTER — Other Ambulatory Visit: Payer: Self-pay

## 2024-06-17 ENCOUNTER — Other Ambulatory Visit (HOSPITAL_COMMUNITY): Payer: Self-pay

## 2024-06-18 ENCOUNTER — Other Ambulatory Visit (HOSPITAL_COMMUNITY): Payer: Self-pay

## 2024-06-19 ENCOUNTER — Other Ambulatory Visit (HOSPITAL_COMMUNITY): Payer: Self-pay

## 2024-06-21 ENCOUNTER — Other Ambulatory Visit (HOSPITAL_COMMUNITY): Payer: Self-pay

## 2024-06-24 ENCOUNTER — Other Ambulatory Visit (HOSPITAL_COMMUNITY): Payer: Self-pay

## 2024-06-27 ENCOUNTER — Other Ambulatory Visit (HOSPITAL_COMMUNITY): Payer: Self-pay

## 2024-07-01 ENCOUNTER — Other Ambulatory Visit (HOSPITAL_COMMUNITY): Payer: Self-pay

## 2024-07-05 ENCOUNTER — Other Ambulatory Visit (HOSPITAL_COMMUNITY): Payer: Self-pay
